# Patient Record
Sex: Male | Born: 1940 | Race: White | Hispanic: No | Marital: Married | Smoking: Former smoker
Health system: Southern US, Community
[De-identification: ages and names within clinical notes are randomized; demographics above are authoritative.]

## PROBLEM LIST (undated history)

## (undated) DIAGNOSIS — K439 Ventral hernia without obstruction or gangrene: Secondary | ICD-10-CM

## (undated) DIAGNOSIS — L219 Seborrheic dermatitis, unspecified: Secondary | ICD-10-CM

## (undated) DIAGNOSIS — R351 Nocturia: Secondary | ICD-10-CM

## (undated) DIAGNOSIS — M25579 Pain in unspecified ankle and joints of unspecified foot: Secondary | ICD-10-CM

## (undated) DIAGNOSIS — N529 Male erectile dysfunction, unspecified: Secondary | ICD-10-CM

## (undated) DIAGNOSIS — H11009 Unspecified pterygium of unspecified eye: Secondary | ICD-10-CM

## (undated) DIAGNOSIS — R209 Unspecified disturbances of skin sensation: Secondary | ICD-10-CM

## (undated) DIAGNOSIS — L97509 Non-pressure chronic ulcer of other part of unspecified foot with unspecified severity: Secondary | ICD-10-CM

## (undated) DIAGNOSIS — L84 Corns and callosities: Secondary | ICD-10-CM

## (undated) DIAGNOSIS — E785 Hyperlipidemia, unspecified: Secondary | ICD-10-CM

## (undated) DIAGNOSIS — J449 Chronic obstructive pulmonary disease, unspecified: Secondary | ICD-10-CM

## (undated) DIAGNOSIS — M545 Low back pain: Secondary | ICD-10-CM

## (undated) DIAGNOSIS — R21 Rash and other nonspecific skin eruption: Secondary | ICD-10-CM

## (undated) DIAGNOSIS — J309 Allergic rhinitis, unspecified: Secondary | ICD-10-CM

## (undated) DIAGNOSIS — R030 Elevated blood-pressure reading, without diagnosis of hypertension: Secondary | ICD-10-CM

## (undated) DIAGNOSIS — N5089 Other specified disorders of the male genital organs: Secondary | ICD-10-CM

## (undated) DIAGNOSIS — F17200 Nicotine dependence, unspecified, uncomplicated: Secondary | ICD-10-CM

## (undated) DIAGNOSIS — N390 Urinary tract infection, site not specified: Secondary | ICD-10-CM

## (undated) DIAGNOSIS — K5730 Diverticulosis of large intestine without perforation or abscess without bleeding: Secondary | ICD-10-CM

## (undated) DIAGNOSIS — B354 Tinea corporis: Secondary | ICD-10-CM

## (undated) DIAGNOSIS — G5600 Carpal tunnel syndrome, unspecified upper limb: Secondary | ICD-10-CM

## (undated) DIAGNOSIS — C44320 Squamous cell carcinoma of skin of unspecified parts of face: Secondary | ICD-10-CM

## (undated) DIAGNOSIS — E7800 Pure hypercholesterolemia, unspecified: Secondary | ICD-10-CM

## (undated) DIAGNOSIS — S060X9A Concussion with loss of consciousness of unspecified duration, initial encounter: Secondary | ICD-10-CM

## (undated) DIAGNOSIS — K5900 Constipation, unspecified: Secondary | ICD-10-CM

## (undated) DIAGNOSIS — R079 Chest pain, unspecified: Secondary | ICD-10-CM

## (undated) DIAGNOSIS — N400 Enlarged prostate without lower urinary tract symptoms: Secondary | ICD-10-CM

## (undated) DIAGNOSIS — Z79899 Other long term (current) drug therapy: Secondary | ICD-10-CM

## (undated) HISTORY — DX: Seborrheic dermatitis, unspecified: L21.9

## (undated) HISTORY — DX: Corns and callosities: L84

## (undated) HISTORY — DX: Elevated blood-pressure reading, without diagnosis of hypertension: R03.0

## (undated) HISTORY — DX: Other specified disorders of the male genital organs: N50.89

## (undated) HISTORY — PX: ELBOW SURGERY: SHX618

## (undated) HISTORY — PX: EYE SURGERY: SHX253

## (undated) HISTORY — DX: Nicotine dependence, unspecified, uncomplicated: F17.200

## (undated) HISTORY — DX: Allergic rhinitis, unspecified: J30.9

## (undated) HISTORY — DX: Unspecified pterygium of unspecified eye: H11.009

## (undated) HISTORY — DX: Tinea corporis: B35.4

## (undated) HISTORY — DX: Male erectile dysfunction, unspecified: N52.9

## (undated) HISTORY — DX: Rash and other nonspecific skin eruption: R21

## (undated) HISTORY — DX: Concussion with loss of consciousness of unspecified duration, initial encounter: S06.0X9A

## (undated) HISTORY — DX: Urinary tract infection, site not specified: N39.0

## (undated) HISTORY — DX: Chest pain, unspecified: R07.9

## (undated) HISTORY — DX: Benign prostatic hyperplasia without lower urinary tract symptoms: N40.0

## (undated) HISTORY — DX: Constipation, unspecified: K59.00

## (undated) HISTORY — DX: Non-pressure chronic ulcer of other part of unspecified foot with unspecified severity: L97.509

## (undated) HISTORY — DX: Squamous cell carcinoma of skin of unspecified parts of face: C44.320

## (undated) HISTORY — DX: Other long term (current) drug therapy: Z79.899

## (undated) HISTORY — DX: Unspecified disturbances of skin sensation: R20.9

## (undated) HISTORY — DX: Low back pain: M54.5

## (undated) HISTORY — DX: Ventral hernia without obstruction or gangrene: K43.9

## (undated) HISTORY — DX: Diverticulosis of large intestine without perforation or abscess without bleeding: K57.30

## (undated) HISTORY — DX: Hyperlipidemia, unspecified: E78.5

## (undated) HISTORY — DX: Carpal tunnel syndrome, unspecified upper limb: G56.00

## (undated) HISTORY — DX: Nocturia: R35.1

## (undated) HISTORY — DX: Chronic obstructive pulmonary disease, unspecified: J44.9

## (undated) HISTORY — DX: Pain in unspecified ankle and joints of unspecified foot: M25.579

---
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
## 1984-06-06 HISTORY — PX: FRACTURE SURGERY: SHX138

## 1994-10-30 DIAGNOSIS — S060XAA Concussion with loss of consciousness status unknown, initial encounter: Secondary | ICD-10-CM

## 1994-10-30 DIAGNOSIS — S060X9A Concussion with loss of consciousness of unspecified duration, initial encounter: Secondary | ICD-10-CM

## 1994-10-30 HISTORY — DX: Concussion with loss of consciousness of unspecified duration, initial encounter: S06.0X9A

## 1994-10-30 HISTORY — DX: Concussion with loss of consciousness status unknown, initial encounter: S06.0XAA

## 1999-12-28 DIAGNOSIS — R21 Rash and other nonspecific skin eruption: Secondary | ICD-10-CM

## 1999-12-28 HISTORY — DX: Rash and other nonspecific skin eruption: R21

## 2001-03-21 DIAGNOSIS — L219 Seborrheic dermatitis, unspecified: Secondary | ICD-10-CM

## 2001-03-21 HISTORY — DX: Seborrheic dermatitis, unspecified: L21.9

## 2001-09-07 ENCOUNTER — Emergency Department (HOSPITAL_COMMUNITY): Admission: EM | Admit: 2001-09-07 | Discharge: 2001-09-08 | Payer: Self-pay | Admitting: *Deleted

## 2001-09-08 ENCOUNTER — Encounter: Payer: Self-pay | Admitting: Emergency Medicine

## 2001-09-08 ENCOUNTER — Inpatient Hospital Stay (HOSPITAL_COMMUNITY): Admission: EM | Admit: 2001-09-08 | Discharge: 2001-09-10 | Payer: Self-pay | Admitting: Emergency Medicine

## 2002-03-08 ENCOUNTER — Emergency Department (HOSPITAL_COMMUNITY): Admission: EM | Admit: 2002-03-08 | Discharge: 2002-03-08 | Payer: Self-pay | Admitting: *Deleted

## 2005-10-06 ENCOUNTER — Emergency Department (HOSPITAL_COMMUNITY): Admission: EM | Admit: 2005-10-06 | Discharge: 2005-10-06 | Payer: Self-pay | Admitting: Emergency Medicine

## 2006-02-24 DIAGNOSIS — J449 Chronic obstructive pulmonary disease, unspecified: Secondary | ICD-10-CM

## 2006-02-24 DIAGNOSIS — E785 Hyperlipidemia, unspecified: Secondary | ICD-10-CM

## 2006-02-24 HISTORY — DX: Chronic obstructive pulmonary disease, unspecified: J44.9

## 2006-02-24 HISTORY — DX: Hyperlipidemia, unspecified: E78.5

## 2006-03-01 DIAGNOSIS — H11009 Unspecified pterygium of unspecified eye: Secondary | ICD-10-CM

## 2006-03-01 HISTORY — DX: Unspecified pterygium of unspecified eye: H11.009

## 2006-09-08 DIAGNOSIS — J309 Allergic rhinitis, unspecified: Secondary | ICD-10-CM

## 2006-09-08 HISTORY — DX: Allergic rhinitis, unspecified: J30.9

## 2007-01-05 HISTORY — PX: KNEE SURGERY: SHX244

## 2007-02-12 ENCOUNTER — Emergency Department (HOSPITAL_COMMUNITY): Admission: EM | Admit: 2007-02-12 | Discharge: 2007-02-12 | Payer: Self-pay | Admitting: Emergency Medicine

## 2007-02-23 ENCOUNTER — Emergency Department (HOSPITAL_COMMUNITY): Admission: EM | Admit: 2007-02-23 | Discharge: 2007-02-23 | Payer: Self-pay | Admitting: Emergency Medicine

## 2007-03-16 DIAGNOSIS — L97509 Non-pressure chronic ulcer of other part of unspecified foot with unspecified severity: Secondary | ICD-10-CM

## 2007-03-16 HISTORY — DX: Non-pressure chronic ulcer of other part of unspecified foot with unspecified severity: L97.509

## 2007-04-11 DIAGNOSIS — M25579 Pain in unspecified ankle and joints of unspecified foot: Secondary | ICD-10-CM

## 2007-04-11 DIAGNOSIS — L84 Corns and callosities: Secondary | ICD-10-CM

## 2007-04-11 HISTORY — DX: Pain in unspecified ankle and joints of unspecified foot: M25.579

## 2007-04-11 HISTORY — DX: Corns and callosities: L84

## 2008-01-02 DIAGNOSIS — N529 Male erectile dysfunction, unspecified: Secondary | ICD-10-CM

## 2008-01-02 HISTORY — DX: Male erectile dysfunction, unspecified: N52.9

## 2008-02-29 ENCOUNTER — Emergency Department (HOSPITAL_COMMUNITY): Admission: EM | Admit: 2008-02-29 | Discharge: 2008-02-29 | Payer: Self-pay | Admitting: Emergency Medicine

## 2009-01-30 ENCOUNTER — Encounter: Admission: RE | Admit: 2009-01-30 | Discharge: 2009-01-30 | Payer: Self-pay | Admitting: Internal Medicine

## 2009-03-16 DIAGNOSIS — G5600 Carpal tunnel syndrome, unspecified upper limb: Secondary | ICD-10-CM

## 2009-03-16 HISTORY — DX: Carpal tunnel syndrome, unspecified upper limb: G56.00

## 2009-05-11 DIAGNOSIS — N390 Urinary tract infection, site not specified: Secondary | ICD-10-CM

## 2009-05-11 DIAGNOSIS — M545 Low back pain, unspecified: Secondary | ICD-10-CM

## 2009-05-11 DIAGNOSIS — K5900 Constipation, unspecified: Secondary | ICD-10-CM

## 2009-05-11 HISTORY — DX: Constipation, unspecified: K59.00

## 2009-05-11 HISTORY — DX: Low back pain, unspecified: M54.50

## 2009-05-11 HISTORY — DX: Urinary tract infection, site not specified: N39.0

## 2009-05-12 ENCOUNTER — Encounter: Admission: RE | Admit: 2009-05-12 | Discharge: 2009-05-12 | Payer: Self-pay | Admitting: Internal Medicine

## 2010-06-14 DIAGNOSIS — R030 Elevated blood-pressure reading, without diagnosis of hypertension: Secondary | ICD-10-CM

## 2010-06-14 DIAGNOSIS — K5730 Diverticulosis of large intestine without perforation or abscess without bleeding: Secondary | ICD-10-CM

## 2010-06-14 HISTORY — DX: Diverticulosis of large intestine without perforation or abscess without bleeding: K57.30

## 2010-06-14 HISTORY — DX: Elevated blood-pressure reading, without diagnosis of hypertension: R03.0

## 2010-10-22 NOTE — H&P (Signed)
[facility]  Patient:    Thomas Reyes, Thomas Reyes Visit Number: [id number] MRN: [id number]          Service Type: MED Location: [phone] Attending Physician:  Terald Sleeper Dictated by:   Terald Sleeper, M.D. Admit Date:  09/08/2001                           History and Physical  IDENTIFICATION:  The patient is a 70 year old male, patient of Dr. Frederik Pear.  CHIEF COMPLAINT:  Cough and cold for the last two weeks.  HISTORY OF PRESENT ILLNESS:  The patient states he has had a "chest cold" for the last two weeks with harsh cough productive of sputum.  His wife also has had an upper respiratory tract infection at home.  Yesterday he developed a temperature of up to 101 degrees and developed left-sided pleuritic pain.  He was seen in the emergency room, given IV antibiotics and a prescription for p.o. Levaquin.  He has returned to the emergency room today, having been unable to keep anything on his stomach, with repetitive bilious colored vomit. Blood cultures also yesterday done in the ER were 2/2 positive for gram-positive cocci in pairs.  PAST MEDICAL HISTORY:  There is no history of heart disease, no history of diabetes mellitus.  The patient denies a history of hypertension.  He is a smoker; however, does not admit to shortness of breath or hemoptysis.  MEDICATIONS:  No other medicine other than Levaquin.  He took one of these today.  SOCIAL HISTORY:  He is retired and a smoker.  No history of alcohol abuse.  REVIEW OF SYSTEMS:  CARDIOVASCULAR:  Positive pleuritic chest pain starting yesterday.  RESPIRATORY:  Chronic cough productive of frothy sputum. GASTROINTESTINAL:  No nausea and vomiting until this morning.  GENITOURINARY: No history of dysuria.  PHYSICAL EXAMINATION:  VITAL SIGNS:  Temperature 96, blood pressure 109/77, pulse 77, respiratory rate 20.  GENERAL:  The patient is a 70 year old somewhat gaunt man who looked  older than his stated age.  No distress.  HEENT:  Normal other than mildly erythematous oropharynx.  LUNGS:  Fine crackles in the left lower lobe without wheezing.  No evidence of respiratory distress.  CARDIOVASCULAR:  Heart sounds are normal.  No murmurs.  ABDOMEN:  There is a fullness in the right upper quadrant, but there was no tenderness, and I could not clearly delineate a liver edge.  EXTREMITIES:  Peripheral pulses are intact.  There is no edema.  CNS:  The patient is alert.  His exam is completely nonfocal.  Strength is equal bilaterally.  LABORATORY DATA:  Chest x-ray shows an extensive left lower lobe consolidation.  White count 12.8, 90% neutrophils, hemoglobin 15.2, platelets 178.  Basic metabolic panel is essentially normal other than a BUN of 31.  His creatinine is 1.4, albumin 3.2.  Liver function tests are normal.  Blood gas revealed pH 7.49, pCO2 of 33.7, pO2 of 56.9.  IMPRESSION:  Pneumococcal pneumonia with bacteremia.  He does not look to be that sick.  I think he probably has been helped by the antibiotics that were given intravenously yesterday.  Probably has underlying chronic obstructive pulmonary disease.   However, I really do not think he is any respiratory distress.  PLAN:  He will be given intravenous Rocephin pending sensitivities of the pneumococcus and will be admitted to a general medical ward.  He appears in stable condition.  Dictated by:   Terald Sleeper, M.D. Attending Physician:  Terald Sleeper DD:  09/08/01 TD:  09/09/01 Job: [id number]

## 2010-10-22 NOTE — Discharge Summary (Signed)
[facility]  Patient:    TABITHA, TUPPER Visit Number: [id number] MRN: [id number]          Service Type: MED Location: [phone] Attending Physician:  Terald Sleeper Dictated by:   Lorin Picket. Claiborne Billings, N.P. Admit Date:  09/08/2001 Disc. Date: 09/10/01   CC:         Lenon Curt. Cassell Clement, M.D.   Discharge Summary  DATE OF BIRTH:  [birth year]  CHIEF COMPLAINT:  Cough and cold for two weeks.  HISTORY OF PRESENT ILLNESS:  The patient is a 70 year old male who is followed as an outpatient by Dr. Murray Hodgkins at [facility] office.  The patient states that he has had a chest cold for two weeks with harsh productive cough with sputum.  His wife also had a respiratory tract infection at home.  He developed a temperature of 101 degrees and some left-sided pleuritic pain.  Seen in the emergency room and given IV antibiotics and a prescription for oral Levaquin.  He returned home and then came back to the emergency room on the day of admission having been unable to keep anything in his stomach with repetitive bilious colored vomitus.  Blood cultures were done in the emergency room on the initial visit, and 2:2 were positive for gram positive cocci in pairs.  The patient was admitted for treatment of sepsis.  PAST MEDICAL HISTORY:  Denies any significant previous medical history, however, does state he is a smoker.  Denies any shortness of breath or hemoptysis.  ADMISSION MEDICATIONS:  Levaquin prescribed during previous emergency room visit, he is on no routine medications.  SOCIAL HISTORY:  Retired and a smoker.  No history of alcohol abuse.  FAMILY HISTORY:  Not listed.  REVIEW OF SYSTEMS:  CARDIOVASCULAR:  Productive pleuritic chest pain starting previously.  RESPIRATORY:  Chronic cough with frothy sputum.  GASTROINTESTINAL:  No nausea or vomiting until day of admission.  GENITOURINARY:  No history of dysuria.  PHYSICAL  EXAMINATION:  VITAL SIGNS:  Temperature 96, blood pressure 109/77, pulse 77, respiratory rate 20.  GENERAL:  A 70 year old, somewhat gaunt appearing male who appears older than his stated age.  In no acute distress.  HEENT:  Normal other than mildly erythematous oropharynx.  LUNGS:  Fine crackles, left lower lobe, without wheezing.  No evidence of respiratory distress.  CARDIOVASCULAR:  Heart sounds are normal without murmur.  ABDOMEN:  Fullness right upper quadrant, but no tenderness.  Bowel sounds are present.  Could not clearly delineate a liver edge.  EXTREMITIES:  Peripheral pulses are intact.  There is no edema.  NEUROLOGIC:  The patient is alert, and neurologic examination nonfocal. Cranial nerves II-XII grossly intact.  Strength is equal bilaterally.  The patient was admitted with pneumococcal pneumonia and bacteremia.  He has likely underlying chronic obstructive pulmonary disease with a smoking history.  ADMISSION LABORATORY DATA:  Chest x-ray indicated left lower lobe pneumonia with prominence of left infrahilar region.  Could not exclude an obstructive mass with CT scan recommended for followup.  Irregular densities in the right apex which could represent scarring.  Recommend followup.  Arterial blood gas on room air showed a pH of7.495, PCO2 33.7, PO2 56.9, bicarbonate 25.4.  CBC on 09/08/01, found white blood cell count high at 12.8, hemoglobin 15.2, hematocrit 44.7, platelets 178.  Neutrophils were elevated at 90.  CBC found sodium slightly low at 133, potassium 3.5, chloride 102, CO2 25, BUN high at 31, creatinine 1.4.  Glucose was elevated at 153.  Liver function tests were unremarkable.  HOSPITAL COURSE:  The patient was admitted to a general medical bed.  Diet continued as regular.  Empiric Rocephin 2 g IV daily initiated.  Previous blood cultures noted positive for pneumococcus.  Also pneumonia presumed pneumococcus.  With antibiotic therapy, the patient  rapidly improved. Tolerating diet well.  Per sensitivities, we will switch to oral Levaquin for a total of a 14 day course.  At discharge, the patient is medically stable/improved.  DISCHARGE LABORATORY DATA:  Followup CBC done on 09/10/01, found white blood cell count normalized at 8.7, hemoglobin 13.9, hematocrit 40.5, platelets 239.  DISCHARGE MEDICATIONS:  Levaquin 500 mg one q.d.  From previous prescription, the patient has 10 days.  We will write for an additional 4 days to give a total of 14 day therapy post discharge.  The patient was on no other routine medications.  DISCHARGE DIAGNOSES: 1. Pneumococcal pneumonia. 2. Pneumococcal sepsis secondary to #1. 3. Probable chronic obstructive pulmonary disease.  CONSULTATIONS:  None.  PROCEDURES:  None.  DIET:  Regular.  DISCHARGE INSTRUCTIONS:  To complete 14 days of Levaquin antibiotic therapy.  FOLLOWUP:  Dr. Murray Hodgkins one to two weeks post discharge to evaluate/follow up for pneumonia and sepsis.  CONDITION ON DISCHARGE:  Stable/improved.  DISCHARGE PROCESS:  Less than 30 minutes.  DISPOSITION:  The patient is returning home independently. Dictated by:   Lorin Picket. Claiborne Billings, N.P. Attending Physician:  Terald Sleeper DD:  09/10/01 TD:  09/11/01 Job: [id number]

## 2011-10-01 ENCOUNTER — Emergency Department (HOSPITAL_COMMUNITY): Payer: Medicare Other

## 2011-10-01 ENCOUNTER — Encounter (HOSPITAL_COMMUNITY): Payer: Self-pay | Admitting: *Deleted

## 2011-10-01 ENCOUNTER — Emergency Department (HOSPITAL_COMMUNITY)
Admission: EM | Admit: 2011-10-01 | Discharge: 2011-10-01 | Disposition: A | Discharge status: Home / Self Care | Payer: Medicare Other | Attending: Emergency Medicine | Admitting: Emergency Medicine

## 2011-10-01 DIAGNOSIS — R0602 Shortness of breath: Secondary | ICD-10-CM | POA: Insufficient documentation

## 2011-10-01 DIAGNOSIS — J029 Acute pharyngitis, unspecified: Secondary | ICD-10-CM | POA: Insufficient documentation

## 2011-10-01 DIAGNOSIS — R059 Cough, unspecified: Secondary | ICD-10-CM | POA: Insufficient documentation

## 2011-10-01 DIAGNOSIS — E7800 Pure hypercholesterolemia, unspecified: Secondary | ICD-10-CM | POA: Insufficient documentation

## 2011-10-01 DIAGNOSIS — R05 Cough: Secondary | ICD-10-CM | POA: Insufficient documentation

## 2011-10-01 DIAGNOSIS — R51 Headache: Secondary | ICD-10-CM | POA: Insufficient documentation

## 2011-10-01 DIAGNOSIS — J069 Acute upper respiratory infection, unspecified: Secondary | ICD-10-CM | POA: Insufficient documentation

## 2011-10-01 HISTORY — DX: Pure hypercholesterolemia, unspecified: E78.00

## 2011-10-01 NOTE — ED Notes (Signed)
Pt says he feels like he has pneumonia or maybe a cold. Pt reports symptoms started on Wednesday with shortness of breath and productive cough with white/yellow colored sputum.  Pt unsure if he has had fevers.  Pt reports using OTC cold medication.  Pt also reports sore throat, nasal drainage.

## 2011-10-01 NOTE — Discharge Instructions (Signed)
Saline Nose Drops  To help clear a stuffy nose, put salt water (saline) nose drops in your infant's nose. This helps to loosen the secretions in the nose. Use a bulb syringe to clean the nose out:  Before feeding.   Before putting your infant down for naps.   No more than once every 3 hours to avoid irritating your infant's nostrils.  HOME CARE  Buy nose drops at your local drug store. You can also make nose drops yourself. Mix 1 cup of water with  teaspoon of salt. Stir. Store this mixture at room temperature. Make a new batch daily.   To use the drops:   Put 1 or 2 drops in each side of infant's nose with a clean medicine dropper. Do not use this dropper for any other medicine.   Squeeze the air out of the suction bulb before inserting it into your infant's nose.   While still squeezing the bulb flat, place the tip of the bulb into a nostril. Let air come back into the bulb. The suction will pull snot out of the nose and into the bulb.   Repeat on other nostril.   Squeeze the bulb several times into a tissue and wash the bulb tip in soapy water. Store the bulb with the tip side down on paper towel.   Use the bulb syringe with only the saline drops to avoid irritating your infant's nostrils.  GET HELP RIGHT AWAY IF:  The snot changes to green or yellow.   The snot gets thicker.   Your infant is 3 months or younger with a rectal temperature of 100.4 F (38 C) or higher.   Your infant is older than 3 months with a rectal temperature of 102 F (38.9 C) or higher.   The stuffy nose lasts 10 days or longer.   There is trouble breathing or feeding.  MAKE SURE YOU:  Understand these instructions.   Will watch your infant's condition.   Will get help right away if your infant is not doing well or gets worse.  Document Released: 03/20/2009 Document Revised: 05/12/2011 Document Reviewed: 03/20/2009 [facility] Patient Information 2012 [place], [place]. Ask the Pharmacist for  Phenylephrine 10 mg tabs take one every 4 hours for congestion   This medication is behind the counter and only available by request

## 2011-10-01 NOTE — ED Provider Notes (Signed)
History     CSN: [id number]  Arrival date & time 10/01/11  1022   First MD Initiated Contact with Patient 10/01/11 1118      Chief Complaint  Patient presents with  . Cough  . Shortness of Breath  . Nasal Congestion  . Sore Throat  . Headache    (Consider location/radiation/quality/duration/timing/severity/associated sxs/prior treatment) HPI Comments: Patient states that he has had URI symptoms for the last 4 days he's been taking over-the-counter decongestants with minimal relief.  States when he lays down at night.  He feels "strangled" when he sits up he can breathe again.  He is in the emergency department to make sure that he doesn't have pneumonia, as he's had this in the past.  He does have a nonproductive annoying  cough  Patient is a 71 y.o. male presenting with cough, shortness of breath, pharyngitis, and headaches. The history is provided by the patient and a relative.  Cough This is a new problem. The current episode started more than 2 days ago. The problem has not changed since onset.The cough is non-productive. There has been no fever. Associated symptoms include sweats, headaches, rhinorrhea, sore throat and shortness of breath. Pertinent negatives include no chest pain, no chills, no ear congestion and no myalgias.  Shortness of Breath  Associated symptoms include rhinorrhea, sore throat, cough and shortness of breath. Pertinent negatives include no chest pain and no fever.  Sore Throat Associated symptoms include congestion, coughing, headaches and a sore throat. Pertinent negatives include no chest pain, chills, fever, myalgias, neck pain or weakness.  Headache  Associated symptoms include shortness of breath. Pertinent negatives include no fever.    Past Medical History  Diagnosis Date  . Hypercholesterolemia     Past Surgical History  Procedure Date  . Elbow surgery   . Knee surgery     No family history on file.  History  Substance Use Topics  .  Smoking status: Former Games developer  . Smokeless tobacco: Not on file  . Alcohol Use: No      Review of Systems  Constitutional: Negative for fever and chills.  HENT: Positive for congestion, sore throat and rhinorrhea. Negative for neck pain, sinus pressure and ear discharge.   Respiratory: Positive for cough and shortness of breath.   Cardiovascular: Negative for chest pain.  Musculoskeletal: Negative for myalgias.  Neurological: Positive for headaches. Negative for dizziness and weakness.    Allergies  Review of patient's allergies indicates no known allergies.  Home Medications   Current Outpatient Rx  Name Route Sig Dispense Refill  . PRAVASTATIN SODIUM 40 MG PO TABS Oral Take 40 mg by mouth daily.      BP 154/75  Pulse 76  Temp(Src) 98.3 F (36.8 C) (Oral)  Resp 20  Ht 5\' 7"  (1.702 m)  Wt 140 lb (63.504 kg)  BMI 21.93 kg/m2  SpO2 95%  Physical Exam  Constitutional: He is oriented to person, place, and time. He appears well-developed and well-nourished.  HENT:  Head: Normocephalic. No trismus in the jaw.  Right Ear: External ear normal. Tympanic membrane is not bulging.  Left Ear: External ear normal. Tympanic membrane is not bulging.  Nose: Rhinorrhea and sinus tenderness present. No mucosal edema, nasal deformity or septal deviation. Right sinus exhibits no maxillary sinus tenderness and no frontal sinus tenderness. Left sinus exhibits no maxillary sinus tenderness and no frontal sinus tenderness.  Mouth/Throat: Uvula is midline and mucous membranes are normal. No uvula swelling. No tonsillar abscesses.  Cardiovascular: Normal rate.   Pulmonary/Chest: Effort normal.  Abdominal: Soft.  Musculoskeletal: Normal range of motion.  Neurological: He is alert and oriented to person, place, and time.  Skin: Skin is warm and dry.    ED Course  Procedures (including critical care time)  Labs Reviewed - No data to display Dg Chest 2 View  10/01/2011  *RADIOLOGY REPORT*   Clinical Data: Cough and SOB  CHEST - 2 VIEW  Comparison: 01/30/2009  Findings: Heart size is normal.  No pleural effusion or edema.  No airspace consolidation identified.  Right apical scarring is noted, this appears similar to previous exam.  The visualized osseous structures are intact.  IMPRESSION:  1.  No acute cardiopulmonary abnormalities.  Original Report Authenticated By: Rosealee Albee, M.D.     1. Upper respiratory infection       MDM  Patient reassured.  Chest x-ray is normal, showing no signs of pneumonia.  Discussed treatment for upper respiratory tract infections and use of decongestants        Arman Filter, NP 10/01/11 1142  Arman Filter, NP 10/01/11 1142

## 2011-10-02 NOTE — ED Provider Notes (Signed)
Medical screening examination/treatment/procedure(s) were performed by non-physician practitioner and as supervising physician I was immediately available for consultation/collaboration.  Jessalynn Mccowan T Solmon Bohr, MD 10/02/11 0808 

## 2011-12-26 DIAGNOSIS — C44320 Squamous cell carcinoma of skin of unspecified parts of face: Secondary | ICD-10-CM

## 2011-12-26 DIAGNOSIS — R079 Chest pain, unspecified: Secondary | ICD-10-CM

## 2011-12-26 HISTORY — DX: Squamous cell carcinoma of skin of unspecified parts of face: C44.320

## 2011-12-26 HISTORY — DX: Chest pain, unspecified: R07.9

## 2012-08-20 DIAGNOSIS — R351 Nocturia: Secondary | ICD-10-CM

## 2012-08-20 DIAGNOSIS — N5089 Other specified disorders of the male genital organs: Secondary | ICD-10-CM

## 2012-08-20 HISTORY — DX: Nocturia: R35.1

## 2012-08-20 HISTORY — DX: Other specified disorders of the male genital organs: N50.89

## 2012-08-27 ENCOUNTER — Ambulatory Visit (INDEPENDENT_AMBULATORY_CARE_PROVIDER_SITE_OTHER): Payer: Medicare Other | Admitting: *Deleted

## 2012-08-27 VITALS — BP 140/64 | HR 66 | Temp 95.0°F | Resp 20 | Ht 68.0 in | Wt 136.0 lb

## 2012-08-27 DIAGNOSIS — I10 Essential (primary) hypertension: Secondary | ICD-10-CM

## 2012-08-27 NOTE — Patient Instructions (Signed)
Per Dr Renato Gails keep next appointment and right down your readings and bring that in with you.

## 2012-09-11 ENCOUNTER — Encounter: Payer: Self-pay | Admitting: Internal Medicine

## 2012-09-27 ENCOUNTER — Ambulatory Visit: Payer: Self-pay | Admitting: Internal Medicine

## 2012-10-03 ENCOUNTER — Encounter: Payer: Self-pay | Admitting: Internal Medicine

## 2012-10-04 ENCOUNTER — Encounter: Payer: Self-pay | Admitting: Internal Medicine

## 2012-10-04 ENCOUNTER — Ambulatory Visit (INDEPENDENT_AMBULATORY_CARE_PROVIDER_SITE_OTHER): Payer: Medicare Other | Admitting: Internal Medicine

## 2012-10-04 VITALS — BP 152/90 | HR 69 | Temp 97.8°F | Resp 16 | Ht 67.0 in | Wt 136.0 lb

## 2012-10-04 DIAGNOSIS — K5901 Slow transit constipation: Secondary | ICD-10-CM

## 2012-10-04 DIAGNOSIS — N138 Other obstructive and reflux uropathy: Secondary | ICD-10-CM | POA: Insufficient documentation

## 2012-10-04 DIAGNOSIS — E7800 Pure hypercholesterolemia, unspecified: Secondary | ICD-10-CM

## 2012-10-04 DIAGNOSIS — N400 Enlarged prostate without lower urinary tract symptoms: Secondary | ICD-10-CM

## 2012-10-04 DIAGNOSIS — Z860100 Personal history of colon polyps, unspecified: Secondary | ICD-10-CM | POA: Insufficient documentation

## 2012-10-04 DIAGNOSIS — I10 Essential (primary) hypertension: Secondary | ICD-10-CM

## 2012-10-04 DIAGNOSIS — N401 Enlarged prostate with lower urinary tract symptoms: Secondary | ICD-10-CM | POA: Insufficient documentation

## 2012-10-04 DIAGNOSIS — R079 Chest pain, unspecified: Secondary | ICD-10-CM

## 2012-10-04 NOTE — Patient Instructions (Signed)
Discussed increasing fruit and veggies in diet to increase fiber.  Drink plenty of fluids with it to help move bowels.  If no success with that, try fiber supplement like benefiber or metamucil.

## 2012-10-04 NOTE — Progress Notes (Signed)
Patient ID: Thomas Reyes, male   DOB: [birth year], 72 y.o.   MRN: [id number] Code Status: Has living will  No Known Allergies  Chief Complaint  Patient presents with  . Medical Managment of Chronic Issues    no new problems today    HPI: Patient is a 72 y.o. Caucasian male seen in the office today for f/u after stress test for chest pain he had been having last time and urinary frequency.  Had his stress test which was unremarkable.  Has f/u with Dr. Jacinto Halim on 5/12. No more of the chest pains.    Went to urologist for prostate.  Was told it was a little enlarged, but not concerning.  Has f/u with him in June.  Is taking new medicine.    Is up to 80 mg of pravachol now per my recommendations.  BP is good at home:  110s-130s/60s-70s over past month.  Takes first thing in morning.    Due sept 8, 2014 for cscope (has polyps and gets q3 years).  Having difficulty with constipation.  Using stool softeners up to 4 a day.    Still off cigarettes.  Encouraged to stay off of them.   Review of Systems:  Review of Systems  Constitutional: Negative for fever, chills and malaise/fatigue.  HENT: Negative for congestion.   Eyes: Negative for blurred vision.  Respiratory: Negative for shortness of breath.   Cardiovascular: Negative for chest pain, palpitations and leg swelling.  Gastrointestinal: Positive for constipation. Negative for nausea, vomiting, abdominal pain and melena.  Genitourinary: Positive for frequency. Negative for dysuria.       Nocturia  Musculoskeletal: Negative for myalgias.  Skin: Negative for rash.  Neurological: Negative for dizziness, weakness and headaches.  Endo/Heme/Allergies: Does not bruise/bleed easily.  Psychiatric/Behavioral: Negative for depression.   Past Medical History  Diagnosis Date  . Hypercholesterolemia   . Edema of male genital organs   . Chest pain, unspecified   . Nocturia   . 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 of  face   . Chest pain, unspecified   . Diverticulosis of colon (without mention of hemorrhage)   . Elevated blood pressure reading without diagnosis of hypertension   . Unspecified constipation   . Urinary tract infection, site not specified   . Lumbago   . Carpal tunnel syndrome   . Encounter for long-term (current) use of other medications   . Impotence of organic origin   . Corns and callosities   . Pain in joint, ankle and foot   . Ulcer of other part of foot   . Allergic rhinitis, cause unspecified   . Pterygium, unspecified   . Other and unspecified hyperlipidemia   . COPD (chronic obstructive pulmonary disease)   . Hypertrophy of prostate without urinary obstruction and other lower urinary tract symptoms (LUTS)   . Dermatophytosis of the body   . Tobacco use disorder   . Disturbance of skin sensation   . Ventral hernia, unspecified, without mention of obstruction or gangrene   . Seborrheic dermatitis, unspecified   . Rash and other nonspecific skin eruption   . Concussion, unspecified    Past Surgical History  Procedure Laterality Date  . Elbow surgery    . Knee surgery    . Tonsillectomy  1948  . Fracture surgery  1986    right hip, right elbow   Social History:   reports that he has quit smoking. He does not have any smokeless  tobacco history on file. He reports that he does not drink alcohol or use illicit drugs.  Family History  Problem Relation Age of Onset  . Diabetes Mother   . Hypertension Mother   . Cancer Father     Medications: Patient's Medications  New Prescriptions   No medications on file  Previous Medications   ASPIRIN 81 MG TABLET    Take 81 mg by mouth daily. Take one tablet once a day   PRAVASTATIN (PRAVACHOL) 40 MG TABLET    Take 80 mg by mouth daily. Take one tablet by mouth once daily.  Modified Medications   No medications on file  Discontinued Medications   No medications on file   Physical Exam: Filed Vitals:   10/04/12 1059   BP: 152/90  Pulse: 69  Temp: 97.8 F (36.6 C)  TempSrc: Oral  Resp: 16  Height: 5\' 7"  (1.702 m)  Weight: 136 lb (61.689 kg)  SpO2: 95%  Physical Exam  Constitutional: He is oriented to person, place, and time.  Thin Caucasian male, NAD  HENT:  Head: Normocephalic and atraumatic.  rhinophyma  Cardiovascular: Normal rate, regular rhythm, normal heart sounds and intact distal pulses.   Pulmonary/Chest: Effort normal and breath sounds normal.  Abdominal: Soft. Bowel sounds are normal.  Musculoskeletal: Normal range of motion.  Neurological: He is alert and oriented to person, place, and time.  Skin: Skin is warm and dry.  Psychiatric: He has a normal mood and affect.   Labs reviewed: 06/23/2011 CBC Wbc 6.1 Rbc 4.88 Hemoglobin 15.2  CMP Glucose 92 Bun 13 Creatinine 1.16  Lipid Panel Cholesterol 179 Triglycerides 72 Hdl 56 Ldl 109  12/21/2011 CBC Wbc 5.6 Rbc 4.77 Hemoglobin 13.9  CMP Glucose 93 Bun 14 Creatinine 1.13  Lipid Panel Cholesterol 186 Triglycerides 74 Hdl 59 Ldl 112  08/16/2012 BMP Glucose 91 Bun 12 Creatinine 1.21 CBC Wbc 6.6 Rbc 4.57 Hemoglobin 13.7  Lipid Panel Cholesterol 175 Triglycerides 71 Hdl 53 Ldl 108  Past Procedures: 04/9146 CT Brain ? Vascular malformation left cerebral pedicle  05//2000 ETT  Low Risk  10/2000 Flex Sig (GREEN) Internal Hemorrhoid Diverticulosis 07-08-2003 Nerve Conduction Study-without electrophysiologic evidence of a left median mononeuropathy across the wrist consistent with moderate left carpal tunnel syndrome without denervation. 08/19/2005 Chest X-Ray  09-22-05 CT Chest left coronary artery calcification, probable post-inflammatory scarring in the lung apices, right greater than left, no evidence of mass.  12/10/05 Chest X-Ray  01/30/2009 X-Ray of the Lumbar Spine Normal  01/30/2009 Cervical Spine X-Ray No acute fracture  01/31/2009 Chest X-Ray Normal  05/12/2009 Lumbar Spine X-Ray Degenerative Changes at L5-S1 Normal alignment   02/11/2010-Colonoscopy  polyps Dr Lavada Mesi Magod   Assessment/Plan Constipation, slow transit Encouraged higher fiber diet with fruits and veggies, and, if that does not help, use fiber supplement with plenty of fluids.  Advised not to use stool softeners indefinitely due to long term side effects in the colon.  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  Was put on a new medication and doesn't know what it was.  Note is not yet scanned.  Chest pain No further episodes.  Had an unremarkable stress test and echo with Dr. Jacinto Halim.  Has f/u with him.  Personal history of colonic polyps Due for f/u colonoscopy in 9/14.    Hypercholesterolemia Continue pravachol and asa.    Labs/tests ordered Lipids, cbc, bmp  F/u 3 mos.

## 2012-10-06 NOTE — Assessment & Plan Note (Signed)
Encouraged higher fiber diet with fruits and veggies, and, if that does not help, use fiber supplement with plenty of fluids.  Advised not to use stool softeners indefinitely due to long term side effects in the colon.

## 2012-10-06 NOTE — Assessment & Plan Note (Signed)
No further episodes.  Had an unremarkable stress test and echo with Dr. Jacinto Halim.  Has f/u with him.

## 2012-10-06 NOTE — Assessment & Plan Note (Signed)
Due for f/u colonoscopy in 9/14.

## 2012-10-06 NOTE — Assessment & Plan Note (Signed)
Now following with urology.  Was put on a new medication and doesn't know what it was.  Note is not yet scanned.

## 2012-10-06 NOTE — Assessment & Plan Note (Signed)
Continue pravachol and asa.

## 2012-10-16 ENCOUNTER — Encounter: Payer: Self-pay | Admitting: Internal Medicine

## 2013-01-08 ENCOUNTER — Other Ambulatory Visit: Payer: Medicare Other

## 2013-01-08 DIAGNOSIS — N400 Enlarged prostate without lower urinary tract symptoms: Secondary | ICD-10-CM

## 2013-01-08 DIAGNOSIS — E7800 Pure hypercholesterolemia, unspecified: Secondary | ICD-10-CM

## 2013-01-08 DIAGNOSIS — I10 Essential (primary) hypertension: Secondary | ICD-10-CM

## 2013-01-08 DIAGNOSIS — Z86010 Personal history of colonic polyps: Secondary | ICD-10-CM

## 2013-01-09 LAB — LIPID PANEL
Chol/HDL Ratio: 2.7 ratio units (ref 0.0–5.0)
Cholesterol, Total: 153 mg/dL (ref 100–199)
HDL: 57 mg/dL (ref >39)
LDL Calculated: 82 mg/dL (ref 0–99)
Triglycerides: 70 mg/dL (ref 0–149)
VLDL Cholesterol Cal: 14 mg/dL (ref 5–40)

## 2013-01-09 LAB — CBC WITH DIFFERENTIAL/PLATELET
Basophils Absolute: 0 10*3/uL (ref 0.0–0.2)
Basos: 0 % (ref 0–3)
Eos: 5 % (ref 0–5)
Eosinophils Absolute: 0.3 10*3/uL (ref 0.0–0.4)
HCT: 40.9 % (ref 37.5–51.0)
Hemoglobin: 13.6 g/dL (ref 12.6–17.7)
Immature Grans (Abs): 0 10*3/uL (ref 0.0–0.1)
Immature Granulocytes: 0 % (ref 0–2)
Lymphocytes Absolute: 1.8 10*3/uL (ref 0.7–3.1)
Lymphs: 32 % (ref 14–46)
MCH: 28.7 pg (ref 26.6–33.0)
MCHC: 33.3 g/dL (ref 31.5–35.7)
MCV: 86 fL (ref 79–97)
Monocytes Absolute: 0.4 10*3/uL (ref 0.1–0.9)
Monocytes: 7 % (ref 4–12)
Neutrophils Absolute: 3.2 10*3/uL (ref 1.4–7.0)
Neutrophils Relative %: 56 % (ref 40–74)
RBC: 4.74 x10E6/uL (ref 4.14–5.80)
RDW: 14.2 % (ref 12.3–15.4)
WBC: 5.7 10*3/uL (ref 3.4–10.8)

## 2013-01-09 LAB — BASIC METABOLIC PANEL
BUN/Creatinine Ratio: 8 — ABNORMAL LOW (ref 10–22)
BUN: 10 mg/dL (ref 8–27)
CO2: 25 mmol/L (ref 18–29)
Calcium: 9.1 mg/dL (ref 8.6–10.2)
Chloride: 106 mmol/L (ref 97–108)
Creatinine, Ser: 1.2 mg/dL (ref 0.76–1.27)
GFR calc Af Amer: 69 mL/min/{1.73_m2} (ref >59)
GFR calc non Af Amer: 60 mL/min/{1.73_m2} (ref >59)
Glucose: 89 mg/dL (ref 65–99)
Potassium: 4.3 mmol/L (ref 3.5–5.2)
Sodium: 141 mmol/L (ref 134–144)

## 2013-01-10 ENCOUNTER — Ambulatory Visit: Payer: Medicare Other | Admitting: Internal Medicine

## 2013-01-17 ENCOUNTER — Encounter: Payer: Self-pay | Admitting: *Deleted

## 2013-01-18 ENCOUNTER — Encounter: Payer: Self-pay | Admitting: Internal Medicine

## 2013-01-18 ENCOUNTER — Ambulatory Visit (INDEPENDENT_AMBULATORY_CARE_PROVIDER_SITE_OTHER): Payer: Medicare Other | Admitting: Internal Medicine

## 2013-01-18 VITALS — BP 140/78 | HR 57 | Temp 97.4°F | Resp 18 | Ht 67.0 in | Wt 133.0 lb

## 2013-01-18 DIAGNOSIS — K5901 Slow transit constipation: Secondary | ICD-10-CM

## 2013-01-18 DIAGNOSIS — C44619 Basal cell carcinoma of skin of left upper limb, including shoulder: Secondary | ICD-10-CM

## 2013-01-18 DIAGNOSIS — E7800 Pure hypercholesterolemia, unspecified: Secondary | ICD-10-CM

## 2013-01-18 DIAGNOSIS — I10 Essential (primary) hypertension: Secondary | ICD-10-CM

## 2013-01-18 DIAGNOSIS — C44611 Basal cell carcinoma of skin of unspecified upper limb, including shoulder: Secondary | ICD-10-CM

## 2013-01-18 MED ORDER — PRAVASTATIN SODIUM 80 MG PO TABS: 80.0000 mg | ORAL_TABLET | Freq: Every day | ORAL | Status: DC

## 2013-01-18 NOTE — Progress Notes (Signed)
Patient ID: Thomas Reyes, male   DOB: [birth year], 72 y.o.   MRN: [id number] Location:  [facility] / [place] [facility]  Code Status: full code   No Known Allergies  Chief Complaint  Patient presents with  . Follow-up    left arm ? skin cancer    HPI: Patient is a 72 y.o. white male seen in the office today for f/u of chronic conditions.  He is concerned he may have skin cancer on his left arm. BP range from 5/8 to 01/15/13:  107-134/60s-70s with HR 50s-60s.  Left posterior forearm with raised pearly purple papule about 2 inches above his wrist.  Has bumped that arm during mowing also, but it healed there since.  Has had skin cancer on his head and his left arm before (squamous cell).  Discussed using sunscreen when outside and wearing a hat.    Reviewed lipid panel with patient--on pravachol 80mg  now.    Review of Systems:  Review of Systems  Constitutional: Negative for fever and weight loss.  Eyes: Positive for redness. Negative for blurred vision.  Respiratory: Negative for shortness of breath.   Cardiovascular: Negative for chest pain.  Gastrointestinal: Negative for heartburn and constipation.  Genitourinary: Negative for dysuria.  Musculoskeletal: Negative for falls.  Skin: Positive for itching.       Area on left forearm concerning  Neurological: Negative for dizziness, weakness and headaches.  Endo/Heme/Allergies: Bruises/bleeds easily.  Psychiatric/Behavioral: Negative for memory loss.    Past Medical History  Diagnosis Date  . Hypercholesterolemia   . Edema of male genital organs 08/20/2012  . Chest pain, unspecified 08/20/2012  . Nocturia 08/20/2012  . 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 Chest pain, unspecified 12/26/2011  . Diverticulosis of colon (without mention of hemorrhage) 06/14/2010  . Elevated blood pressure reading without diagnosis of hypertension 06/14/2010  . Unspecified  constipation 05/11/2009  . Urinary tract infection, site not specified 05/11/2009  . Lumbago 05/11/2009  . Carpal tunnel syndrome 03/16/2009  . Encounter for long-term (current) use of other medications   . 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 Corns and callosities 04/11/2007  . Pain in joint, ankle and foot 04/11/2007  . Ulcer of other part of foot 03/16/2007  . Allergic rhinitis, cause unspecified 09/08/2006  . Pterygium, unspecified 03/01/2006  . Other and unspecified hyperlipidemia 02/24/2006  . COPD (chronic obstructive pulmonary disease) 02/24/2006  . Hypertrophy of prostate without urinary obstruction and other lower urinary tract symptoms (LUTS)   . Dermatophytosis of the body   . Tobacco use disorder   . Disturbance of skin sensation   . Ventral hernia, unspecified, without mention of obstruction or gangrene   . Seborrheic dermatitis, unspecified 03/21/2001  . Rash and other nonspecific skin eruption 12/28/1999  . Concussion, unspecified 10/30/1994    Past Surgical History  Procedure Laterality Date  . Elbow surgery Right   . Knee surgery Right 01/2007    knee cap injury  . Tonsillectomy  1948  . Fracture surgery  1986    right hip, right elbow    Social History:   reports that he has quit smoking. He does not have any smokeless tobacco history on file. He reports that he does not drink alcohol or use illicit drugs.  Family History  Problem Relation Age of Onset  . Diabetes Mother   . Hypertension Mother   . Cancer Father     Medications: Patient's  Medications  New Prescriptions   No medications on file  Previous Medications   ASPIRIN 81 MG TABLET    Take 81 mg by mouth daily. Take one tablet once a day   PRAVASTATIN (PRAVACHOL) 80 MG TABLET    Take 80 mg by mouth daily. Take 1 tablet by mouth at bedtime for cholesterol.  Modified Medications   No medications on file  Discontinued Medications   No medications on file     Physical Exam: Filed  Vitals:   01/18/13 0830  BP: 140/78  Pulse: 57  Temp: 97.4 F (36.3 C)  TempSrc: Oral  Resp: 18  Height: 5\' 7"  (1.702 m)  Weight: 133 lb (60.328 kg)  SpO2: 97%  Physical Exam  Constitutional: He is oriented to person, place, and time. He appears well-developed and well-nourished. No distress.  HENT:  Head: Normocephalic and atraumatic.  Cardiovascular: Normal rate, regular rhythm, normal heart sounds and intact distal pulses.   Pulmonary/Chest: Effort normal and breath sounds normal. No respiratory distress.  Abdominal: Soft. Bowel sounds are normal. He exhibits no distension and no mass. There is no tenderness.  Musculoskeletal: Normal range of motion. He exhibits no edema and no tenderness.  Neurological: He is alert and oriented to person, place, and time.  Skin: Skin is warm and dry. No rash noted.  Some ecchymoses on arms visible, left forearm with 1/2 inch diameter papule, purple, pearly, nontender, about 2 inches above wrist on dorsal forearm;  Also has rhinophyma  Psychiatric: He has a normal mood and affect.  ?slight intellectual disability    Labs reviewed: Basic Metabolic Panel:  Recent Labs  45/40/98 0825  NA 141  K 4.3  CL 106  CO2 25  GLUCOSE 89  BUN 10  CREATININE 1.20  CALCIUM 9.1  CBC:  Recent Labs  01/08/13 0825  WBC 5.7  NEUTROABS 3.2  HGB 13.6  HCT 40.9  MCV 86   Lipid Panel:  Recent Labs  01/08/13 0825  HDL 57  LDLCALC 82  TRIG 70  CHOLHDL 2.7   Assessment/Plan 1. Hypercholesterolemia - pravastatin (PRAVACHOL) 80 MG tablet; Take 1 tablet (80 mg total) by mouth daily. Take 1 tablet by mouth at bedtime for cholesterol.  Dispense: 90 tablet; Refill: 3 -much improved with highest dose pravachol--notes big pill  2. 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 Essential hypertension, benign --at goal all of the time at home  4. Constipation, slow transit -fiber and miralax helped, but then got too loose, and now hasn't had to take anything -eating more vegetables and that's helping--going about daily now -for cscope again next month--needs every 3 years due to his diverticulosis--reinforced importance of doing this  Labs/tests ordered:  Cbc, liver panel, lipids before 6 month f/u Next appt: 1 month re: left wrist ? Basal cell; 6 mo for regular visit

## 2013-02-13 LAB — HM COLONOSCOPY

## 2013-02-14 ENCOUNTER — Ambulatory Visit: Payer: Medicare Other | Admitting: Internal Medicine

## 2013-07-16 ENCOUNTER — Other Ambulatory Visit: Payer: Commercial Managed Care - HMO

## 2013-07-16 DIAGNOSIS — C44619 Basal cell carcinoma of skin of left upper limb, including shoulder: Secondary | ICD-10-CM

## 2013-07-16 DIAGNOSIS — E7800 Pure hypercholesterolemia, unspecified: Secondary | ICD-10-CM

## 2013-07-17 LAB — CBC WITH DIFFERENTIAL/PLATELET
Basophils Absolute: 0 10*3/uL (ref 0.0–0.2)
Basos: 0 %
Eos: 5 %
Eosinophils Absolute: 0.3 10*3/uL (ref 0.0–0.4)
HCT: 41.5 % (ref 37.5–51.0)
Hemoglobin: 13.5 g/dL (ref 12.6–17.7)
Immature Grans (Abs): 0 10*3/uL (ref 0.0–0.1)
Immature Granulocytes: 0 %
Lymphocytes Absolute: 1.4 10*3/uL (ref 0.7–3.1)
Lymphs: 25 %
MCH: 25.9 pg — ABNORMAL LOW (ref 26.6–33.0)
MCHC: 32.5 g/dL (ref 31.5–35.7)
MCV: 80 fL (ref 79–97)
Monocytes Absolute: 0.4 10*3/uL (ref 0.1–0.9)
Monocytes: 6 %
Neutrophils Absolute: 3.8 10*3/uL (ref 1.4–7.0)
Neutrophils Relative %: 64 %
RBC: 5.21 x10E6/uL (ref 4.14–5.80)
RDW: 15.2 % (ref 12.3–15.4)
WBC: 5.9 10*3/uL (ref 3.4–10.8)

## 2013-07-17 LAB — LIPID PANEL
Chol/HDL Ratio: 3.1 ratio units (ref 0.0–5.0)
Cholesterol, Total: 176 mg/dL (ref 100–199)
HDL: 56 mg/dL (ref >39)
LDL Calculated: 106 mg/dL — ABNORMAL HIGH (ref 0–99)
Triglycerides: 70 mg/dL (ref 0–149)
VLDL Cholesterol Cal: 14 mg/dL (ref 5–40)

## 2013-07-17 LAB — HEPATIC FUNCTION PANEL
ALT: 14 IU/L (ref 0–44)
AST: 23 IU/L (ref 0–40)
Albumin: 4.1 g/dL (ref 3.5–4.8)
Alkaline Phosphatase: 59 IU/L (ref 39–117)
Bilirubin, Direct: 0.14 mg/dL (ref 0.00–0.40)
Total Bilirubin: 0.6 mg/dL (ref 0.0–1.2)
Total Protein: 6.6 g/dL (ref 6.0–8.5)

## 2013-07-18 ENCOUNTER — Ambulatory Visit (INDEPENDENT_AMBULATORY_CARE_PROVIDER_SITE_OTHER): Payer: Medicare HMO | Admitting: Internal Medicine

## 2013-07-18 ENCOUNTER — Encounter: Payer: Self-pay | Admitting: Internal Medicine

## 2013-07-18 VITALS — BP 160/84 | HR 54 | Temp 97.4°F | Wt 138.4 lb

## 2013-07-18 DIAGNOSIS — Z86010 Personal history of colonic polyps: Secondary | ICD-10-CM

## 2013-07-18 DIAGNOSIS — E7800 Pure hypercholesterolemia, unspecified: Secondary | ICD-10-CM

## 2013-07-18 DIAGNOSIS — K5901 Slow transit constipation: Secondary | ICD-10-CM

## 2013-07-18 DIAGNOSIS — Z23 Encounter for immunization: Secondary | ICD-10-CM

## 2013-07-18 DIAGNOSIS — I10 Essential (primary) hypertension: Secondary | ICD-10-CM | POA: Insufficient documentation

## 2013-07-18 DIAGNOSIS — R21 Rash and other nonspecific skin eruption: Secondary | ICD-10-CM | POA: Insufficient documentation

## 2013-07-18 DIAGNOSIS — N400 Enlarged prostate without lower urinary tract symptoms: Secondary | ICD-10-CM

## 2013-07-18 DIAGNOSIS — R079 Chest pain, unspecified: Secondary | ICD-10-CM

## 2013-07-18 MED ORDER — LISINOPRIL 5 MG PO TABS: 5.0000 mg | ORAL_TABLET | Freq: Every day | ORAL | Status: DC

## 2013-07-18 MED ORDER — SIMVASTATIN 20 MG PO TABS: 20.0000 mg | ORAL_TABLET | Freq: Every day | ORAL | Status: DC

## 2013-07-18 MED ORDER — ZOSTER VACCINE LIVE 19400 UNT/0.65ML ~~LOC~~ SOLR: 0.6500 mL | Freq: Once | SUBCUTANEOUS | Status: DC

## 2013-07-18 MED ORDER — CLOTRIMAZOLE-BETAMETHASONE 1-0.05 % EX CREA: 1.0000 "application " | TOPICAL_CREAM | Freq: Two times a day (BID) | CUTANEOUS | Status: DC

## 2013-07-18 NOTE — Assessment & Plan Note (Signed)
F/u cscope 5 years from 11/14 if pt remains healthy enough for procedure at that time.

## 2013-07-18 NOTE — Assessment & Plan Note (Signed)
LDL has come up since last visit.

## 2013-07-18 NOTE — Assessment & Plan Note (Signed)
Has resolved.  Unclear why he had a period of constipation, but recently bowels are moving daily.

## 2013-07-18 NOTE — Assessment & Plan Note (Signed)
Will add lisinopril 5mg  daily for blood pressure and have him come back for bp check in 2 wks.

## 2013-07-18 NOTE — Progress Notes (Signed)
Patient ID: Thomas Reyes, male   DOB: 1940-07-20, 73 y.o.   MRN: [id number]   Location:  [facility] / [place] [facility]  Code Status: full code   Allergies  Allergen Reactions  . Lipitor [Atorvastatin]     Muscle and bone pain but tolerated pravachol    Chief Complaint  Patient presents with  . Medical Managment of Chronic Issues    f/u & discuss labs. (printed)  . other    need medication refill on a cream for itching, not sure of the name  . Immunizations    declines Flu vaccine and Rx to be printed for the shingles vaccine    HPI: Patient is a 73 y.o. white male seen in the office today for medical mgt of his chronic diseases, review of his labs.    Had a cream for itching in groin area.  Wakes him up and itches himself almost raw.  Flakes off dry skin.    Changed his insurance to [place] so will need new scripts to fax to [facility].    Got colonoscopy done and now needs them every 5 years instead of every 3 years.    Place on left arm that he was concerned about being skin cancer again went away.    Discussed cutting back on fried foods.    Is still off cigarettes.  Doing well with this.    Has some discomfort in his chest after meals--feels it is gas.    Bowels are moving well now.    Gets up 1-2 times at night to urinate.  Depends on how much he drinks in the daytime.  Especially a problem after 8pm.    Review of Systems:  Review of Systems  Constitutional: Negative for fever, chills, weight loss and malaise/fatigue.  HENT: Negative for congestion.   Eyes: Negative for blurred vision.  Respiratory: Negative for shortness of breath.   Cardiovascular: Negative for chest pain and leg swelling.  Gastrointestinal: Positive for heartburn. Negative for abdominal pain, constipation, blood in stool and melena.       Constipation resolved  Musculoskeletal: Negative for falls and myalgias.  Neurological: Negative for dizziness, loss of  consciousness and weakness.  Endo/Heme/Allergies: Bruises/bleeds easily.  Psychiatric/Behavioral: Negative for depression and memory loss.    Past Medical History  Diagnosis Date  . Hypercholesterolemia   . Edema of male genital organs 08/20/2012  . Chest pain, unspecified 08/20/2012  . Nocturia 08/20/2012  . 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 Chest pain, unspecified 12/26/2011  . Diverticulosis of colon (without mention of hemorrhage) 06/14/2010  . Elevated blood pressure reading without diagnosis of hypertension 06/14/2010  . Unspecified constipation 05/11/2009  . Urinary tract infection, site not specified 05/11/2009  . Lumbago 05/11/2009  . Carpal tunnel syndrome 03/16/2009  . Encounter for long-term (current) use of other medications   . 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 Corns and callosities 04/11/2007  . Pain in joint, ankle and foot 04/11/2007  . Ulcer of other part of foot 03/16/2007  . Allergic rhinitis, cause unspecified 09/08/2006  . Pterygium, unspecified 03/01/2006  . Other and unspecified hyperlipidemia 02/24/2006  . COPD (chronic obstructive pulmonary disease) 02/24/2006  . Hypertrophy of prostate without urinary obstruction and other lower urinary tract symptoms (LUTS)   . Dermatophytosis of the body   . Tobacco use disorder   . Disturbance of skin sensation   . Ventral hernia, unspecified, without  mention of obstruction or gangrene   . Seborrheic dermatitis, unspecified 03/21/2001  . Rash and other nonspecific skin eruption 12/28/1999  . Concussion, unspecified 10/30/1994    Past Surgical History  Procedure Laterality Date  . Elbow surgery Right   . Knee surgery Right 01/2007    knee cap injury  . Tonsillectomy  1948  . Fracture surgery  1986    right hip, right elbow    Social History:   reports that he quit smoking about 3 years ago. He does not have any smokeless tobacco history on file. He  reports that he does not drink alcohol or use illicit drugs.  Family History  Problem Relation Age of Onset  . Diabetes Mother   . Hypertension Mother   . Cancer Father     Medications: Patient's Medications  New Prescriptions   CLOTRIMAZOLE-BETAMETHASONE (LOTRISONE) CREAM    Apply 1 application topically 2 (two) times daily. To groin for rash   LISINOPRIL (PRINIVIL,ZESTRIL) 5 MG TABLET    Take 1 tablet (5 mg total) by mouth daily.   SIMVASTATIN (ZOCOR) 20 MG TABLET    Take 1 tablet (20 mg total) by mouth at bedtime.  Previous Medications   ASPIRIN 81 MG TABLET    Take 81 mg by mouth daily. Take one tablet once a day  Modified Medications   Modified Medication Previous Medication   ZOSTER VACCINE LIVE, PF, (ZOSTAVAX) 16109 UNT/0.65ML INJECTION zoster vaccine live, PF, (ZOSTAVAX) 60454 UNT/0.65ML injection      Inject 19,400 Units into the skin once.    Inject 0.65 mLs into the skin once.  Discontinued Medications   PRAVASTATIN (PRAVACHOL) 80 MG TABLET    Take 1 tablet (80 mg total) by mouth daily. Take 1 tablet by mouth at bedtime for cholesterol.     Physical Exam: Filed Vitals:   07/18/13 0833 07/18/13 0933  BP: 146/80 160/84  Pulse: 54   Temp: 97.4 F (36.3 C)   TempSrc: Oral   Weight: 138 lb 6.4 oz (62.778 kg)   SpO2: 96%   Physical Exam  Constitutional: He appears well-developed and well-nourished. No distress.  Thin white male  Cardiovascular: Normal rate, regular rhythm, normal heart sounds and intact distal pulses.   Pulmonary/Chest: Effort normal and breath sounds normal. No respiratory distress.  Abdominal: Soft. Bowel sounds are normal. He exhibits no distension and no mass. There is no tenderness.  Musculoskeletal: Normal range of motion. He exhibits no edema and no tenderness.  No paravertebral muscle tenderness of lumbar region at present  Skin: Skin is warm and dry.  Rhinophyma, area resolved on left arm, dry scaly rash in left groin, no erythema or odor    Psychiatric: He has a normal mood and affect.    Labs reviewed: Basic Metabolic Panel:  Recent Labs  01/08/13 0825  NA 141  K 4.3  CL 106  CO2 25  GLUCOSE 89  BUN 10  CREATININE 1.20  CALCIUM 9.1   Liver Function Tests:  Recent Labs  07/16/13 0812  AST 23  ALT 14  ALKPHOS 59  BILITOT 0.6  PROT 6.6  CBC:  Recent Labs  01/08/13 0825 07/16/13 0812  WBC 5.7 5.9  NEUTROABS 3.2 3.8  HGB 13.6 13.5  HCT 40.9 41.5  MCV 86 80   Lipid Panel:  Recent Labs  01/08/13 0825 07/16/13 0812  HDL 57 56  LDLCALC 82 106*  TRIG 70 70  CHOLHDL 2.7 3.1   Past Procedures: Cscope 11/14:  External  and internal hemorrhoids.  Diverticular disease in sigmoid colon.  Next due in 5 years now.  Assessment/Plan Hypercholesterolemia LDL has come up since last visit.    Rash of groin Will add antifungal cream due to rash.  Does not appear to be yeast.    Personal history of colonic polyps F/u cscope 5 years from 11/14 if pt remains healthy enough for procedure at that time.    Hypertrophy of prostate without urinary obstruction and other lower urinary tract symptoms (LUTS) Symptoms are stable, in fact improved from previous visits w/ only 1-2x nocturia that is affected by when he stops drinking in the evenings.  Must stop before 8pm  Essential hypertension, benign Will add lisinopril 5mg  daily for blood pressure and have him come back for bp check in 2 wks.  Constipation, slow transit Has resolved.  Unclear why he had a period of constipation, but recently bowels are moving daily.  Chest pain Has been associated with flatus and indigestion postprandially.  Cont to monitor    Need for prophylactic vaccination and inoculation against other viral diseases(V04.89) - zoster vaccine live, PF, (ZOSTAVAX) 67672 UNT/0.65ML injection; Inject 19,400 Units into the skin once.  Dispense: 1 each; Refill: 0  Need for prophylactic vaccination against Streptococcus pneumoniae  (pneumococcus) - Pneumococcal conjugate vaccine 13-valent  Labs/tests ordered:  Liver panel, FLP in 3 mos Next appt:  3 mos

## 2013-07-18 NOTE — Assessment & Plan Note (Signed)
Symptoms are stable, in fact improved from previous visits w/ only 1-2x nocturia that is affected by when he stops drinking in the evenings.  Must stop before 8pm

## 2013-07-18 NOTE — Assessment & Plan Note (Signed)
Will add antifungal cream due to rash.  Does not appear to be yeast.

## 2013-07-18 NOTE — Assessment & Plan Note (Signed)
Has been associated with flatus and indigestion postprandially.  Cont to monitor

## 2013-07-29 ENCOUNTER — Other Ambulatory Visit: Payer: Self-pay | Admitting: *Deleted

## 2013-07-29 DIAGNOSIS — E7800 Pure hypercholesterolemia, unspecified: Secondary | ICD-10-CM

## 2013-07-29 DIAGNOSIS — I10 Essential (primary) hypertension: Secondary | ICD-10-CM

## 2013-07-29 MED ORDER — LISINOPRIL 5 MG PO TABS
5.0000 mg | ORAL_TABLET | Freq: Every day | ORAL | Status: DC
Start: 2013-07-29 — End: 2013-07-31

## 2013-07-29 MED ORDER — SIMVASTATIN 20 MG PO TABS: 20.0000 mg | ORAL_TABLET | Freq: Every day | ORAL | Status: DC

## 2013-07-29 MED ORDER — CLOTRIMAZOLE-BETAMETHASONE 1-0.05 % EX CREA: 1.0000 "application " | TOPICAL_CREAM | Freq: Two times a day (BID) | CUTANEOUS | Status: DC

## 2013-07-31 ENCOUNTER — Other Ambulatory Visit: Payer: Self-pay | Admitting: *Deleted

## 2013-07-31 DIAGNOSIS — E7800 Pure hypercholesterolemia, unspecified: Secondary | ICD-10-CM

## 2013-07-31 DIAGNOSIS — I10 Essential (primary) hypertension: Secondary | ICD-10-CM

## 2013-07-31 MED ORDER — LISINOPRIL 5 MG PO TABS: 5.0000 mg | ORAL_TABLET | Freq: Every day | ORAL | Status: DC

## 2013-07-31 MED ORDER — CLOTRIMAZOLE-BETAMETHASONE 1-0.05 % EX CREA: 1.0000 "application " | TOPICAL_CREAM | Freq: Two times a day (BID) | CUTANEOUS | Status: DC

## 2013-07-31 MED ORDER — SIMVASTATIN 20 MG PO TABS
20.0000 mg | ORAL_TABLET | Freq: Every day | ORAL | Status: DC
Start: 2013-07-31 — End: 2014-03-31

## 2013-07-31 NOTE — Telephone Encounter (Signed)
Called [facility] # [phone] and spoke with [facility] Pharmacist and called in patient's Rx's to mail order. ID # [id number] Wife Aware

## 2013-08-13 ENCOUNTER — Other Ambulatory Visit: Payer: Medicare HMO

## 2013-08-15 ENCOUNTER — Ambulatory Visit (INDEPENDENT_AMBULATORY_CARE_PROVIDER_SITE_OTHER): Payer: Medicare HMO

## 2013-08-15 VITALS — BP 144/82 | HR 60 | Wt 138.0 lb

## 2013-08-15 DIAGNOSIS — R030 Elevated blood-pressure reading, without diagnosis of hypertension: Secondary | ICD-10-CM

## 2013-08-15 NOTE — Patient Instructions (Signed)
Per Dr Mariea Clonts:  Will keep BP medication the same and will see him at his upcoming appointment in May.

## 2013-10-16 ENCOUNTER — Other Ambulatory Visit: Payer: Medicare HMO

## 2013-10-18 ENCOUNTER — Ambulatory Visit: Payer: Medicare HMO | Admitting: Internal Medicine

## 2013-11-25 ENCOUNTER — Other Ambulatory Visit: Payer: Commercial Managed Care - HMO

## 2013-11-25 DIAGNOSIS — E7800 Pure hypercholesterolemia, unspecified: Secondary | ICD-10-CM

## 2013-11-26 LAB — LIPID PANEL
Chol/HDL Ratio: 2.8 ratio units (ref 0.0–5.0)
Cholesterol, Total: 159 mg/dL (ref 100–199)
HDL: 57 mg/dL (ref >39)
LDL Calculated: 90 mg/dL (ref 0–99)
Triglycerides: 60 mg/dL (ref 0–149)
VLDL Cholesterol Cal: 12 mg/dL (ref 5–40)

## 2013-11-26 LAB — HEPATIC FUNCTION PANEL
ALT: 9 [IU]/L (ref 0–44)
AST: 15 [IU]/L (ref 0–40)
Albumin: 4.1 g/dL (ref 3.5–4.8)
Alkaline Phosphatase: 54 [IU]/L (ref 39–117)
Bilirubin, Direct: 0.09 mg/dL (ref 0.00–0.40)
Total Bilirubin: 0.3 mg/dL (ref 0.0–1.2)
Total Protein: 6.4 g/dL (ref 6.0–8.5)

## 2013-11-28 ENCOUNTER — Encounter: Payer: Self-pay | Admitting: Internal Medicine

## 2013-11-28 ENCOUNTER — Ambulatory Visit (INDEPENDENT_AMBULATORY_CARE_PROVIDER_SITE_OTHER): Payer: Commercial Managed Care - HMO | Admitting: Internal Medicine

## 2013-11-28 VITALS — BP 140/72 | HR 62 | Temp 97.2°F | Wt 134.0 lb

## 2013-11-28 DIAGNOSIS — I10 Essential (primary) hypertension: Secondary | ICD-10-CM

## 2013-11-28 DIAGNOSIS — Z87828 Personal history of other (healed) physical injury and trauma: Secondary | ICD-10-CM

## 2013-11-28 DIAGNOSIS — R21 Rash and other nonspecific skin eruption: Secondary | ICD-10-CM

## 2013-11-28 DIAGNOSIS — N400 Enlarged prostate without lower urinary tract symptoms: Secondary | ICD-10-CM

## 2013-11-28 DIAGNOSIS — E7800 Pure hypercholesterolemia, unspecified: Secondary | ICD-10-CM

## 2013-11-28 DIAGNOSIS — K5901 Slow transit constipation: Secondary | ICD-10-CM

## 2013-11-28 NOTE — Progress Notes (Signed)
Thomas Reyes ID: Thomas Reyes, male   DOB: 1941/02/29, 73 y.o.   MRN: [id number]   Location:  [facility] / [place] [facility]  Code Status: full code   Allergies  Allergen Reactions  . Lipitor [Atorvastatin]     Muscle and bone pain but tolerated pravachol    Chief Complaint  Thomas Reyes presents with  . Medical Management of Chronic Issues    blood pressure, cholesterol     HPI: Thomas Reyes is a 73 y.o. white male seen in the office today for medical mgt of chronic diseases.  BP is now at goal in 110s-130s/60s with HR 50s-70s.    Says he had an episode of getting dizzy when he worked outside in the 98 degree weather.  Discussed need for hydration when outside in the heat.    Using lotrisone only as needed twice a day--not needed lately.    Reviewed labs with Thomas Reyes:  Cholesterol now at goal and liver panel wnl.    Still has periods of constipation.  Discussed not using senna s or miralax daily--only when needed.  Needs to see eye doctor and needs referral.  Bladder doing ok now.  Denies urinary symptoms this time.  Insurance company was not going to cover the zostavax much, and he was still going to have to pay $200.  Did not get.  Had prevnar in 07/18/13.  Has not yet had pneumococcal 23 valent.  Will need his flu shot at his next appt.  Review of Systems:  Review of Systems  Constitutional: Negative for fever.  HENT: Negative for congestion.   Eyes: Negative for blurred vision.  Respiratory: Negative for cough and shortness of breath.   Cardiovascular: Positive for chest pain.       Brief episodes (workup has been unremarkable)  Gastrointestinal: Negative for abdominal pain, constipation, blood in stool and melena.  Genitourinary: Negative for dysuria.  Musculoskeletal: Negative for falls.  Neurological: Negative for dizziness, loss of consciousness and headaches.  Psychiatric/Behavioral: Negative for depression and memory loss.    Past Medical History    Diagnosis Date  . Hypercholesterolemia   . Edema of male genital organs 08/20/2012  . Chest pain, unspecified 08/20/2012  . Nocturia 08/20/2012  . 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 Chest pain, unspecified 12/26/2011  . Diverticulosis of colon (without mention of hemorrhage) 06/14/2010  . Elevated blood pressure reading without diagnosis of hypertension 06/14/2010  . Unspecified constipation 05/11/2009  . Urinary tract infection, site not specified 05/11/2009  . Lumbago 05/11/2009  . Carpal tunnel syndrome 03/16/2009  . Encounter for long-term (current) use of other medications   . 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 Corns and callosities 04/11/2007  . Pain in joint, ankle and foot 04/11/2007  . Ulcer of other part of foot 03/16/2007  . Allergic rhinitis, cause unspecified 09/08/2006  . Pterygium, unspecified 03/01/2006  . Other and unspecified hyperlipidemia 02/24/2006  . COPD (chronic obstructive pulmonary disease) 02/24/2006  . Hypertrophy of prostate without urinary obstruction and other lower urinary tract symptoms (LUTS)   . Dermatophytosis of the body   . Tobacco use disorder   . Disturbance of skin sensation   . Ventral hernia, unspecified, without mention of obstruction or gangrene   . Seborrheic dermatitis, unspecified 03/21/2001  . Rash and other nonspecific skin eruption 12/28/1999  . Concussion, unspecified 10/30/1994    Past Surgical History  Procedure Laterality Date  . Elbow surgery Right   .  Knee surgery Right 01/2007    knee cap injury  . Tonsillectomy  1948  . Fracture surgery  1986    right hip, right elbow    Social History:   reports that he quit smoking about 3 years ago. He does not have any smokeless tobacco history on file. He reports that he does not drink alcohol or use illicit drugs.  Family History  Problem Relation Age of Onset  . Diabetes Mother   . Hypertension Mother   . Cancer  Father     Medications: Thomas Reyes's Medications  New Prescriptions   No medications on file  Previous Medications   ASPIRIN 81 MG TABLET    Take 81 mg by mouth daily. Take one tablet once a day   CLOTRIMAZOLE-BETAMETHASONE (LOTRISONE) CREAM    Apply 1 application topically 2 (two) times daily. To groin for rash   LISINOPRIL (PRINIVIL,ZESTRIL) 5 MG TABLET    Take 1 tablet (5 mg total) by mouth daily.   SIMVASTATIN (ZOCOR) 20 MG TABLET    Take 1 tablet (20 mg total) by mouth at bedtime.   ZOSTER VACCINE LIVE, PF, (ZOSTAVAX) 68341 UNT/0.65ML INJECTION    Inject 19,400 Units into the skin once.  Modified Medications   No medications on file  Discontinued Medications   No medications on file     Physical Exam: Filed Vitals:   11/28/13 0817  BP: 140/72  Pulse: 62  Temp: 97.2 F (36.2 C)  TempSrc: Oral  Weight: 134 lb (60.782 kg)  Physical Exam  Constitutional: He is oriented to person, place, and time. He appears well-developed and well-nourished. No distress.  Cardiovascular: Normal rate, regular rhythm, normal heart sounds and intact distal pulses.   Pulmonary/Chest: Effort normal and breath sounds normal. No respiratory distress.  Abdominal: Soft. Bowel sounds are normal. He exhibits no distension and no mass. There is no tenderness.  Musculoskeletal: Normal range of motion. He exhibits no tenderness.  Neurological: He is alert and oriented to person, place, and time.  Psychiatric: He has a normal mood and affect.    Labs reviewed: Basic Metabolic Panel:  Recent Labs  01/08/13 0825  NA 141  K 4.3  CL 106  CO2 25  GLUCOSE 89  BUN 10  CREATININE 1.20  CALCIUM 9.1   Liver Function Tests:  Recent Labs  07/16/13 0812 11/25/13 0807  AST 23 15  ALT 14 9  ALKPHOS 59 54  BILITOT 0.6 0.3  PROT 6.6 6.4  CBC:  Recent Labs  01/08/13 0825 07/16/13 0812  WBC 5.7 5.9  NEUTROABS 3.2 3.8  HGB 13.6 13.5  HCT 40.9 41.5  MCV 86 80   Lipid Panel:  Recent Labs   01/08/13 0825 07/16/13 0812 11/25/13 0807  HDL 57 56 57  LDLCALC 82 106* 90  TRIG 70 70 60  CHOLHDL 2.7 3.1 2.8   Assessment/Plan 1. Essential hypertension, benign -bp at goal with lisinopril 5mg   -advised he must continue this though (bp still in 140s here)  2. Hypercholesterolemia -cholesterol at goal with simvastatin 20mg   3. Rash of groin -better right now with use of cream off and on  4. Constipation, slow transit -discussed intermittent use of miralax and senna-s is ok, but not daily use -encouraged high fiber diet with fluid intake  5. Hypertrophy of prostate without urinary obstruction and other lower urinary tract symptoms (LUTS) Symptoms improved lately  6. History of non-penetrating eye injury Needed referral back to Dr. Zenia Resides office -already followed there -  Ambulatory referral to Ophthalmology  Labs/tests ordered:   Orders Placed This Encounter  Procedures  . Ambulatory referral to Ophthalmology    Referral Priority:  Routine    Referral Type:  Consultation    Referral Reason:  Specialty Services Required    Requested Specialty:  Ophthalmology    Number of Visits Requested:  1    Next appt:  4 mos for annual exam

## 2013-12-03 ENCOUNTER — Emergency Department (HOSPITAL_COMMUNITY): Payer: Medicare HMO

## 2013-12-03 ENCOUNTER — Encounter (HOSPITAL_COMMUNITY): Payer: Self-pay | Admitting: Emergency Medicine

## 2013-12-03 ENCOUNTER — Emergency Department (HOSPITAL_COMMUNITY)
Admission: EM | Admit: 2013-12-03 | Discharge: 2013-12-03 | Disposition: A | Discharge status: Home / Self Care | Payer: Medicare HMO | Attending: Emergency Medicine | Admitting: Emergency Medicine

## 2013-12-03 DIAGNOSIS — Y9389 Activity, other specified: Secondary | ICD-10-CM | POA: Diagnosis not present

## 2013-12-03 DIAGNOSIS — Z872 Personal history of diseases of the skin and subcutaneous tissue: Secondary | ICD-10-CM | POA: Insufficient documentation

## 2013-12-03 DIAGNOSIS — S2191XA Laceration without foreign body of unspecified part of thorax, initial encounter: Secondary | ICD-10-CM

## 2013-12-03 DIAGNOSIS — Z8619 Personal history of other infectious and parasitic diseases: Secondary | ICD-10-CM | POA: Diagnosis not present

## 2013-12-03 DIAGNOSIS — Z7982 Long term (current) use of aspirin: Secondary | ICD-10-CM | POA: Insufficient documentation

## 2013-12-03 DIAGNOSIS — W298XXA Contact with other powered powered hand tools and household machinery, initial encounter: Secondary | ICD-10-CM | POA: Insufficient documentation

## 2013-12-03 DIAGNOSIS — Z87891 Personal history of nicotine dependence: Secondary | ICD-10-CM | POA: Diagnosis not present

## 2013-12-03 DIAGNOSIS — J449 Chronic obstructive pulmonary disease, unspecified: Secondary | ICD-10-CM | POA: Insufficient documentation

## 2013-12-03 DIAGNOSIS — Z8669 Personal history of other diseases of the nervous system and sense organs: Secondary | ICD-10-CM | POA: Diagnosis not present

## 2013-12-03 DIAGNOSIS — Z85828 Personal history of other malignant neoplasm of skin: Secondary | ICD-10-CM | POA: Insufficient documentation

## 2013-12-03 DIAGNOSIS — Y9289 Other specified places as the place of occurrence of the external cause: Secondary | ICD-10-CM | POA: Diagnosis not present

## 2013-12-03 DIAGNOSIS — Y990 Civilian activity done for income or pay: Secondary | ICD-10-CM | POA: Insufficient documentation

## 2013-12-03 DIAGNOSIS — Z79899 Other long term (current) drug therapy: Secondary | ICD-10-CM | POA: Insufficient documentation

## 2013-12-03 DIAGNOSIS — E7800 Pure hypercholesterolemia, unspecified: Secondary | ICD-10-CM | POA: Diagnosis not present

## 2013-12-03 DIAGNOSIS — S21109A Unspecified open wound of unspecified front wall of thorax without penetration into thoracic cavity, initial encounter: Secondary | ICD-10-CM | POA: Diagnosis not present

## 2013-12-03 DIAGNOSIS — IMO0002 Reserved for concepts with insufficient information to code with codable children: Secondary | ICD-10-CM | POA: Diagnosis not present

## 2013-12-03 DIAGNOSIS — E785 Hyperlipidemia, unspecified: Secondary | ICD-10-CM | POA: Insufficient documentation

## 2013-12-03 DIAGNOSIS — Z87448 Personal history of other diseases of urinary system: Secondary | ICD-10-CM | POA: Insufficient documentation

## 2013-12-03 DIAGNOSIS — Z23 Encounter for immunization: Secondary | ICD-10-CM | POA: Diagnosis not present

## 2013-12-03 DIAGNOSIS — Z8719 Personal history of other diseases of the digestive system: Secondary | ICD-10-CM | POA: Diagnosis not present

## 2013-12-03 DIAGNOSIS — J4489 Other specified chronic obstructive pulmonary disease: Secondary | ICD-10-CM | POA: Insufficient documentation

## 2013-12-03 DIAGNOSIS — Z87440 Personal history of urinary (tract) infections: Secondary | ICD-10-CM | POA: Diagnosis not present

## 2013-12-03 MED ORDER — ONDANSETRON HCL 4 MG/2ML IJ SOLN
4.0000 mg | Freq: Once | INTRAMUSCULAR | Status: AC
  Administered 2013-12-03: 4 mg via INTRAVENOUS
  Filled 2013-12-03: qty 2

## 2013-12-03 MED ORDER — HYDROMORPHONE HCL PF 1 MG/ML IJ SOLN
1.0000 mg | Freq: Once | INTRAMUSCULAR | Status: AC
  Administered 2013-12-03: 1 mg via INTRAVENOUS
  Filled 2013-12-03: qty 1

## 2013-12-03 MED ORDER — TRAMADOL HCL 50 MG PO TABS: 50.0000 mg | ORAL_TABLET | Freq: Four times a day (QID) | ORAL | Status: DC | PRN

## 2013-12-03 MED ORDER — CEPHALEXIN 500 MG PO CAPS: 500.0000 mg | ORAL_CAPSULE | Freq: Three times a day (TID) | ORAL | Status: DC

## 2013-12-03 MED ORDER — CEFAZOLIN SODIUM 1-5 GM-% IV SOLN
1.0000 g | Freq: Once | INTRAVENOUS | Status: AC
  Administered 2013-12-03: 1 g via INTRAVENOUS
  Filled 2013-12-03: qty 50

## 2013-12-03 MED ORDER — TETANUS-DIPHTH-ACELL PERTUSSIS 5-2.5-18.5 LF-MCG/0.5 IM SUSP
0.5000 mL | Freq: Once | INTRAMUSCULAR | Status: AC
  Administered 2013-12-03: 0.5 mL via INTRAMUSCULAR
  Filled 2013-12-03: qty 0.5

## 2013-12-03 NOTE — Discharge Instructions (Signed)
Keep wound very clean/dry. Have sutures removed, your doctor or urgent care, in 10-12 days.  Take motrin or aleve as need. You may also take ultram as need for pain - no driving when taking ultram. Take keflex (antibiotic) to help prevent wound infection. Return to ER right away if worse, infection of wound (spreading redness, increased swelling, pus from wound), fevers, chest pain, trouble breathing, other concern.  You were given pain medication in the ER - no driving for the next 4 hours.     Deep Skin Avulsion A deep skin avulsion is when all layers of the skin or parts of body structures have been torn away. This is usually a result of severe injury (trauma). A deep skin avulsion can include damage to important structures beneath the skin such as tendons, ligaments, nerves, or blood vessels.  CAUSES  Many injuries can lead to a deep skin avulsion. These include:   Crush injuries.  Bites.  Falls against jagged surfaces.  Gunshot wounds.  Severe burns and injuries involving dragging (such as those from a bicycle or motorcycle accident). TREATMENT   If the wound is small and there is no damage to vital structures like nerves and blood vessels, the damaged tissues may be removed. Then, the wound can be cleaned thoroughly and closed.  A skin graft may be performed. This is a procedure in which the outer layer of skin is removed from a different part of your body. That skin (skin graft) is used to cover the open wound. This can happen after damaged tissue is removed and repairs are completed.  Your caregiver may onlyapply a bandage (dressing) to the wound. The wound will be kept clean and allowed to heal. Healing can take weeks or months and usually leaves a large scar. This type of treatment is only done if your caregiver feels that skin grafting or a similar procedure would not work. You might need a tetanus shot if:  You cannot remember when you had your last tetanus shot.  You  have never had a tetanus shot.  The injury broke your skin. If you got a tetanus shot, your arm may swell, get red, and feel warm to the touch. This is common and not a problem. If you need a tetanus shot and you choose not to have one, there is a rare chance of getting tetanus. Sickness from tetanus can be serious. HOME CARE INSTRUCTIONS   Only take over-the-counter or prescription medicines for pain, discomfort, or fever as directed by your caregiver.  Gently wash the area with mild soap and water 2 times a day, or as directed. Rinse off the soap. Pat the area dry with a clean towel. Do not rub the wound. This may cause bleeding.  Follow your caregiver's instructions for how often you need to change the dressing.  Apply ointment and a dressing to the wound as directed.  If the dressing sticks, moisten it with soapy water and gently remove it.  Change the bandage right away if it becomes wet, dirty, or starts to smell bad.  Take showers. Do not take tub baths, swim, or do anything that may soak the wound until it is healed.  Use anti-itch medicine as directed by your caregiver. The wound may itch when it is healing. Do not pick or scratch at the wound.  Follow up with your caregiver for stitches (sutures), staple, or skin adhesive strip removal. SEEK MEDICAL CARE IF:   You have redness, swelling, or increasing pain  in your wound.  A red streak or line extends away from the wound.  You have pus coming from the wound.  You notice a bad smell coming from thewound or dressing.  The wound breaks open (edges not staying together) after sutures have been removed.  You notice something coming out of the wound, such as a small piece of wood, glass, or metal.  You are unable to properly move a finger or toe if the wound is on your hand or foot.  You have severe swelling around the wound that causes pain and numbness.  Your arm, hand, leg, or foot changes color. SEEK IMMEDIATE  MEDICAL CARE IF:   Your pain becomes severe or is not adequately relieved with pain medicine.  You have a fever.  You have nausea and vomiting for more than 24 hours.  You feel lightheaded, weak, or faint.  You develop chest pain or difficulty breathing. MAKE SURE YOU:   Understand these instructions.  Will watch your condition.  Will get help right away if you are not doing well or get worse. Document Released: 07/19/2006 Document Revised: 08/15/2011 Document Reviewed: 09/26/2010 [facility] Patient Information 2015 [place], [place]. This information is not intended to replace advice given to you by your health care provider. Make sure you discuss any questions you have with your health care provider.    Laceration Care, Adult A laceration is a cut or lesion that goes through all layers of the skin and into the tissue just beneath the skin. TREATMENT  Some lacerations may not require closure. Some lacerations may not be able to be closed due to an increased risk of infection. It is important to see your caregiver as soon as possible after an injury to minimize the risk of infection and maximize the opportunity for successful closure. If closure is appropriate, pain medicines may be given, if needed. The wound will be cleaned to help prevent infection. Your caregiver will use stitches (sutures), staples, wound glue (adhesive), or skin adhesive strips to repair the laceration. These tools bring the skin edges together to allow for faster healing and a better cosmetic outcome. However, all wounds will heal with a scar. Once the wound has healed, scarring can be minimized by covering the wound with sunscreen during the day for 1 full year. HOME CARE INSTRUCTIONS  For sutures or staples:  Keep the wound clean and dry.  If you were given a bandage (dressing), you should change it at least once a day. Also, change the dressing if it becomes wet or dirty, or as directed by your  caregiver.  Wash the wound with soap and water 2 times a day. Rinse the wound off with water to remove all soap. Pat the wound dry with a clean towel.  After cleaning, apply a thin layer of the antibiotic ointment as recommended by your caregiver. This will help prevent infection and keep the dressing from sticking.  You may shower as usual after the first 24 hours. Do not soak the wound in water until the sutures are removed.  Only take over-the-counter or prescription medicines for pain, discomfort, or fever as directed by your caregiver.  Get your sutures or staples removed as directed by your caregiver. For skin adhesive strips:  Keep the wound clean and dry.  Do not get the skin adhesive strips wet. You may bathe carefully, using caution to keep the wound dry.  If the wound gets wet, pat it dry with a clean towel.  Skin  adhesive strips will fall off on their own. You may trim the strips as the wound heals. Do not remove skin adhesive strips that are still stuck to the wound. They will fall off in time. For wound adhesive:  You may briefly wet your wound in the shower or bath. Do not soak or scrub the wound. Do not swim. Avoid periods of heavy perspiration until the skin adhesive has fallen off on its own. After showering or bathing, gently pat the wound dry with a clean towel.  Do not apply liquid medicine, cream medicine, or ointment medicine to your wound while the skin adhesive is in place. This may loosen the film before your wound is healed.  If a dressing is placed over the wound, be careful not to apply tape directly over the skin adhesive. This may cause the adhesive to be pulled off before the wound is healed.  Avoid prolonged exposure to sunlight or tanning lamps while the skin adhesive is in place. Exposure to ultraviolet light in the first year will darken the scar.  The skin adhesive will usually remain in place for 5 to 10 days, then naturally fall off the skin. Do  not pick at the adhesive film. You may need a tetanus shot if:  You cannot remember when you had your last tetanus shot.  You have never had a tetanus shot. If you get a tetanus shot, your arm may swell, get red, and feel warm to the touch. This is common and not a problem. If you need a tetanus shot and you choose not to have one, there is a rare chance of getting tetanus. Sickness from tetanus can be serious. SEEK MEDICAL CARE IF:   You have redness, swelling, or increasing pain in the wound.  You see a red line that goes away from the wound.  You have yellowish-white fluid (pus) coming from the wound.  You have a fever.  You notice a bad smell coming from the wound or dressing.  Your wound breaks open before or after sutures have been removed.  You notice something coming out of the wound such as wood or glass.  Your wound is on your hand or foot and you cannot move a finger or toe. SEEK IMMEDIATE MEDICAL CARE IF:   Your pain is not controlled with prescribed medicine.  You have severe swelling around the wound causing pain and numbness or a change in color in your arm, hand, leg, or foot.  Your wound splits open and starts bleeding.  You have worsening numbness, weakness, or loss of function of any joint around or beyond the wound.  You develop painful lumps near the wound or on the skin anywhere on your body. MAKE SURE YOU:   Understand these instructions.  Will watch your condition.  Will get help right away if you are not doing well or get worse. Document Released: 05/23/2005 Document Revised: 08/15/2011 Document Reviewed: 11/16/2010 [facility] Patient Information 2015 [place], [place]. This information is not intended to replace advice given to you by your health care provider. Make sure you discuss any questions you have with your health care provider.

## 2013-12-03 NOTE — ED Provider Notes (Signed)
1:17 PM I repaired the patient's laceration of left chest without difficulty.   LACERATION REPAIR Performed by: Alvina Chou Authorized by: Alvina Chou Consent: Verbal consent obtained. Risks and benefits: risks, benefits and alternatives were discussed Consent given by: patient Patient identity confirmed: provided demographic data Prepped and Draped in normal sterile fashion Wound explored  Laceration Location: left chest  Laceration Length: 7 cm  No Foreign Bodies seen or palpated  Anesthesia: local infiltration  Local anesthetic: lidocaine 1% with epinephrine  Anesthetic total: 12 ml  Irrigation method: syringe Amount of cleaning: standard  Skin closure: 4-0 vicryl rapid (deep), 4-0 vicryl (superficial)  Number of sutures: 6 deep, 9 superficial  Technique: simple  Patient tolerance: Patient tolerated the procedure well with no immediate complications.   Alvina Chou, PA-C 12/03/13 1320

## 2013-12-03 NOTE — ED Notes (Signed)
Pt here with severe traumatic laceration of left chest by cutting wheel. Pt states that he was using a cutting wheel to cut metal and it "got away from him". C/o of nausea, no vomiting.

## 2013-12-03 NOTE — ED Provider Notes (Signed)
CSN: [id number]     Arrival date & time 12/03/13  1102 History   First MD Initiated Contact with Patient 12/03/13 1105     Chief Complaint  Patient presents with  . Laceration  . Trauma     (Consider location/radiation/quality/duration/timing/severity/associated sxs/prior Treatment) Patient is a 73 y.o. male presenting with skin laceration and trauma. The history is provided by the patient.  Laceration Trauma   Current symptoms:      Associated symptoms:            Denies abdominal pain, back pain, chest pain, headache, neck pain and vomiting.  pt c/o injury to chest wall at work just pta today.  Was handing a 'grinder' up to coworker, it was on, accidentally got dropped a few feet hitting left chest causing chest wall injury/laceration. No sob. No pleuritic pain. No abd pain. Denies any other pain or injury.  Tetanus unknown. Pain localized to lac, constant, dull, moderate, worse palpation.     Past Medical History  Diagnosis Date  . Hypercholesterolemia   . Edema of male genital organs 08/20/2012  . Chest pain, unspecified 08/20/2012  . Nocturia 08/20/2012  . 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 Chest pain, unspecified 12/26/2011  . Diverticulosis of colon (without mention of hemorrhage) 06/14/2010  . Elevated blood pressure reading without diagnosis of hypertension 06/14/2010  . Unspecified constipation 05/11/2009  . Urinary tract infection, site not specified 05/11/2009  . Lumbago 05/11/2009  . Carpal tunnel syndrome 03/16/2009  . Encounter for long-term (current) use of other medications   . 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 Corns and callosities 04/11/2007  . Pain in joint, ankle and foot 04/11/2007  . Ulcer of other part of foot 03/16/2007  . Allergic rhinitis, cause unspecified 09/08/2006  . Pterygium, unspecified 03/01/2006  . Other and unspecified hyperlipidemia 02/24/2006  . COPD (chronic obstructive  pulmonary disease) 02/24/2006  . Hypertrophy of prostate without urinary obstruction and other lower urinary tract symptoms (LUTS)   . Dermatophytosis of the body   . Tobacco use disorder   . Disturbance of skin sensation   . Ventral hernia, unspecified, without mention of obstruction or gangrene   . Seborrheic dermatitis, unspecified 03/21/2001  . Rash and other nonspecific skin eruption 12/28/1999  . Concussion, unspecified 10/30/1994   Past Surgical History  Procedure Laterality Date  . Elbow surgery Right   . Knee surgery Right 01/2007    knee cap injury  . Tonsillectomy  1948  . Fracture surgery  1986    right hip, right elbow   Family History  Problem Relation Age of Onset  . Diabetes Mother   . Hypertension Mother   . Cancer Father    History  Substance Use Topics  . Smoking status: Former Smoker    Quit date: 06/09/2010  . Smokeless tobacco: Not on file  . Alcohol Use: No    Review of Systems  Constitutional: Negative for fever.  HENT: Negative for nosebleeds.   Eyes: Negative for redness.  Respiratory: Negative for shortness of breath.   Cardiovascular: Negative for chest pain.  Gastrointestinal: Negative for vomiting and abdominal pain.  Genitourinary: Negative for flank pain.  Musculoskeletal: Negative for back pain and neck pain.  Skin: Negative for rash.  Neurological: Negative for headaches.  Hematological: Does not bruise/bleed easily.  Psychiatric/Behavioral: Negative for confusion.      Allergies  Lipitor  Home Medications   Prior to Admission medications  Medication Sig Start Date End Date Taking? Authorizing Provider  aspirin 81 MG tablet Take 81 mg by mouth daily. Take one tablet once a day    Historical Provider, MD  clotrimazole-betamethasone (LOTRISONE) cream Apply 1 application topically 2 (two) times daily. To groin for rash 07/31/13   Tiffany L Reed, DO  lisinopril (PRINIVIL,ZESTRIL) 5 MG tablet Take 1 tablet (5 mg total) by mouth  daily. 07/31/13   Tiffany L Reed, DO  simvastatin (ZOCOR) 20 MG tablet Take 1 tablet (20 mg total) by mouth at bedtime. 07/31/13   Tiffany L Reed, DO  zoster vaccine live, PF, (ZOSTAVAX) 19379 UNT/0.65ML injection Inject 19,400 Units into the skin once. 07/18/13   Tiffany L Reed, DO   BP 121/65  Pulse 56  Resp 18  SpO2 96% Physical Exam  Nursing note and vitals reviewed. Constitutional: He is oriented to person, place, and time. He appears well-developed and well-nourished. No distress.  HENT:  Head: Atraumatic.  Mouth/Throat: Oropharynx is clear and moist.  Eyes: Conjunctivae are normal. Pupils are equal, round, and reactive to light.  Neck: Neck supple. No tracheal deviation present.  Cardiovascular: Normal rate, regular rhythm, normal heart sounds and intact distal pulses.   Pulmonary/Chest: Effort normal and breath sounds normal. No accessory muscle usage. No respiratory distress. He exhibits tenderness.  Symmetric excursion, no crepitus.   Abdominal: Soft. Bowel sounds are normal. He exhibits no distension and no mass. There is no tenderness. There is no rebound and no guarding.  Musculoskeletal: Normal range of motion. He exhibits no edema and no tenderness.  Neurological: He is alert and oriented to person, place, and time.  Skin: Skin is warm and dry. He is not diaphoretic.  Psychiatric: He has a normal mood and affect.    ED Course  Procedures (including critical care time) Labs Review  Dg Chest 2 View  12/03/2013   CLINICAL DATA:  grinder to chest, evaluate for pneumothorax, rib fracture.  EXAM: CHEST  2 VIEW  COMPARISON:  Prior radiograph from 10/01/2011  FINDINGS: Cardiac and mediastinal silhouettes are stable in size and contour, and remain within normal limits.  Lungs are normally inflated. Irregular biapical pleural thickening with architectural distortion at the right lung apex is stable from prior. No pneumothorax. Wedge-shaped hazy opacity within the peripheral left lung  base on frontal projection is favored to reflect atelectasis. No focal infiltrates identified. No pulmonary edema or pleural effusion.  Diffuse osteopenia present. No acute rib fracture identified. Remotely healed left-sided rib fractures noted.  IMPRESSION: 1. No pneumothorax or acute rib fracture identified. 2. Hazy wedge-shaped opacity within the peripheral left lung base, favored to reflect mild left basilar atelectasis.   Electronically Signed   By: Jeannine Boga M.D.   On: 12/03/2013 12:21      MDM  Iv ns. Labs. Dilaudid 1 mg iv. zofran iv.  Tetanus im.  Reviewed nursing notes and prior charts for additional history.   Recheck pt, no increased wob, no chest pain (x pain localized to skin/soft tissue wound).  Ancef 1 gm iv.    See laceration note per PA - wound closed by PA Szekalski.  Sterile dressing.     Mirna Mires, MD 12/03/13 [phone]

## 2013-12-05 NOTE — ED Provider Notes (Signed)
Medical screening examination/treatment/procedure(s) were conducted as a shared visit with non-physician practitioner(s) and myself.  I personally evaluated the patient during the encounter.   EKG Interpretation   Date/Time:  Tuesday December 03 2013 11:12:57 EDT Ventricular Rate:  53 PR Interval:  163 QRS Duration: 77 QT Interval:  424 QTC Calculation: 398 R Axis:   33 Text Interpretation:  Sinus rhythm No previous tracing Confirmed by Ashok Cordia   MD, Lennette Bihari ([id number]) on 12/03/2013 12:20:10 PM      See H and P by me.   Mirna Mires, MD 12/05/13 [phone]

## 2013-12-13 ENCOUNTER — Emergency Department (HOSPITAL_COMMUNITY)
Admission: EM | Admit: 2013-12-13 | Discharge: 2013-12-13 | Disposition: A | Discharge status: Home / Self Care | Payer: Medicare HMO | Attending: Emergency Medicine | Admitting: Emergency Medicine

## 2013-12-13 ENCOUNTER — Encounter (HOSPITAL_COMMUNITY): Payer: Self-pay | Admitting: Emergency Medicine

## 2013-12-13 DIAGNOSIS — Z8719 Personal history of other diseases of the digestive system: Secondary | ICD-10-CM | POA: Diagnosis not present

## 2013-12-13 DIAGNOSIS — Z8781 Personal history of (healed) traumatic fracture: Secondary | ICD-10-CM | POA: Insufficient documentation

## 2013-12-13 DIAGNOSIS — Z87891 Personal history of nicotine dependence: Secondary | ICD-10-CM | POA: Diagnosis not present

## 2013-12-13 DIAGNOSIS — Z872 Personal history of diseases of the skin and subcutaneous tissue: Secondary | ICD-10-CM | POA: Diagnosis not present

## 2013-12-13 DIAGNOSIS — Z87440 Personal history of urinary (tract) infections: Secondary | ICD-10-CM | POA: Insufficient documentation

## 2013-12-13 DIAGNOSIS — Z792 Long term (current) use of antibiotics: Secondary | ICD-10-CM | POA: Insufficient documentation

## 2013-12-13 DIAGNOSIS — Z87448 Personal history of other diseases of urinary system: Secondary | ICD-10-CM | POA: Diagnosis not present

## 2013-12-13 DIAGNOSIS — Z8669 Personal history of other diseases of the nervous system and sense organs: Secondary | ICD-10-CM | POA: Insufficient documentation

## 2013-12-13 DIAGNOSIS — Z4801 Encounter for change or removal of surgical wound dressing: Secondary | ICD-10-CM | POA: Diagnosis present

## 2013-12-13 DIAGNOSIS — Z8619 Personal history of other infectious and parasitic diseases: Secondary | ICD-10-CM | POA: Insufficient documentation

## 2013-12-13 DIAGNOSIS — Z7982 Long term (current) use of aspirin: Secondary | ICD-10-CM | POA: Insufficient documentation

## 2013-12-13 DIAGNOSIS — Z85828 Personal history of other malignant neoplasm of skin: Secondary | ICD-10-CM | POA: Insufficient documentation

## 2013-12-13 DIAGNOSIS — E7800 Pure hypercholesterolemia, unspecified: Secondary | ICD-10-CM | POA: Insufficient documentation

## 2013-12-13 DIAGNOSIS — Z5189 Encounter for other specified aftercare: Secondary | ICD-10-CM | POA: Diagnosis not present

## 2013-12-13 DIAGNOSIS — Z79899 Other long term (current) drug therapy: Secondary | ICD-10-CM | POA: Diagnosis not present

## 2013-12-13 DIAGNOSIS — Z4802 Encounter for removal of sutures: Secondary | ICD-10-CM

## 2013-12-13 DIAGNOSIS — J4489 Other specified chronic obstructive pulmonary disease: Secondary | ICD-10-CM | POA: Insufficient documentation

## 2013-12-13 DIAGNOSIS — Z8739 Personal history of other diseases of the musculoskeletal system and connective tissue: Secondary | ICD-10-CM | POA: Insufficient documentation

## 2013-12-13 DIAGNOSIS — N400 Enlarged prostate without lower urinary tract symptoms: Secondary | ICD-10-CM | POA: Insufficient documentation

## 2013-12-13 DIAGNOSIS — J449 Chronic obstructive pulmonary disease, unspecified: Secondary | ICD-10-CM | POA: Diagnosis not present

## 2013-12-13 NOTE — ED Provider Notes (Signed)
CSN: [id number]     Arrival date & time 12/13/13  0865 History   First MD Initiated Contact with Patient 12/13/13 1000     Chief Complaint  Patient presents with  . Suture / Staple Removal     (Consider location/radiation/quality/duration/timing/severity/associated sxs/prior Treatment) HPI Comments: Patient presents today to have sutures removed from his left chest wall.  Sutures were placed in the ED ten days ago.  He is currently on Keflex and reports that he has one more dose left.  He states that he has been taking the Keflex as directed.  He denies fever, chills, nausea, vomiting, chest pain, cough, or SOB.  His tetanus was updated at his initial visit.  He denies any drainage from the wound.  Denies any surrounding warmth or edema.    The history is provided by the patient.    Past Medical History  Diagnosis Date  . Hypercholesterolemia   . Edema of male genital organs 08/20/2012  . Chest pain, unspecified 08/20/2012  . Nocturia 08/20/2012  . 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 Chest pain, unspecified 12/26/2011  . Diverticulosis of colon (without mention of hemorrhage) 06/14/2010  . Elevated blood pressure reading without diagnosis of hypertension 06/14/2010  . Unspecified constipation 05/11/2009  . Urinary tract infection, site not specified 05/11/2009  . Lumbago 05/11/2009  . Carpal tunnel syndrome 03/16/2009  . Encounter for long-term (current) use of other medications   . 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 Corns and callosities 04/11/2007  . Pain in joint, ankle and foot 04/11/2007  . Ulcer of other part of foot 03/16/2007  . Allergic rhinitis, cause unspecified 09/08/2006  . Pterygium, unspecified 03/01/2006  . Other and unspecified hyperlipidemia 02/24/2006  . COPD (chronic obstructive pulmonary disease) 02/24/2006  . Hypertrophy of prostate without urinary obstruction and other lower urinary tract symptoms  (LUTS)   . Dermatophytosis of the body   . Tobacco use disorder   . Disturbance of skin sensation   . Ventral hernia, unspecified, without mention of obstruction or gangrene   . Seborrheic dermatitis, unspecified 03/21/2001  . Rash and other nonspecific skin eruption 12/28/1999  . Concussion, unspecified 10/30/1994   Past Surgical History  Procedure Laterality Date  . Elbow surgery Right   . Knee surgery Right 01/2007    knee cap injury  . Tonsillectomy  1948  . Fracture surgery  1986    right hip, right elbow   Family History  Problem Relation Age of Onset  . Diabetes Mother   . Hypertension Mother   . Cancer Father    History  Substance Use Topics  . Smoking status: Former Smoker    Quit date: 06/09/2010  . Smokeless tobacco: Not on file  . Alcohol Use: No    Review of Systems  Constitutional: Negative for fever and chills.  Respiratory: Negative for cough and shortness of breath.   Cardiovascular: Negative for chest pain.      Allergies  Lipitor  Home Medications   Prior to Admission medications   Medication Sig Start Date End Date Taking? Authorizing Provider  aspirin 81 MG tablet Take 81 mg by mouth daily. Take one tablet once a day    Historical Provider, MD  cephALEXin (KEFLEX) 500 MG capsule Take 1 capsule (500 mg total) by mouth 3 (three) times daily. 12/03/13   Mirna Mires, MD  lisinopril (PRINIVIL,ZESTRIL) 5 MG tablet Take 1 tablet (5 mg total) by mouth  daily. 07/31/13   Tiffany L Reed, DO  simvastatin (ZOCOR) 20 MG tablet Take 1 tablet (20 mg total) by mouth at bedtime. 07/31/13   Tiffany L Reed, DO  traMADol (ULTRAM) 50 MG tablet Take 1 tablet (50 mg total) by mouth every 6 (six) hours as needed. 12/03/13   Mirna Mires, MD   BP 142/79  Pulse 77  Temp(Src) 98 F (36.7 C) (Oral)  Resp 18  SpO2 94% Physical Exam  Nursing note and vitals reviewed. Constitutional: He appears well-developed and well-nourished.  HENT:  Head: Normocephalic and  atraumatic.  Cardiovascular: Normal rate, regular rhythm and normal heart sounds.   Pulmonary/Chest: Effort normal and breath sounds normal.    Neurological: He is alert.  Skin: Skin is warm and dry.  Psychiatric: He has a normal mood and affect.    ED Course  SUTURE REMOVAL Date/Time: 12/13/2013 10:33 AM Performed by: Hyman Bible Authorized by: Hyman Bible Consent: Verbal consent obtained. Risks and benefits: risks, benefits and alternatives were discussed Consent given by: patient Patient understanding: patient states understanding of the procedure being performed Patient consent: the patient's understanding of the procedure matches consent given Procedure consent: procedure consent matches procedure scheduled Patient identity confirmed: verbally with patient Location: left chest. Wound Appearance: tender, clean and indurated Sutures Removed: 9 Post-removal: dressing applied, Steri-Strips applied and antibiotic ointment applied Facility: sutures placed in this facility Patient tolerance: Patient tolerated the procedure well with no immediate complications.   (including critical care time) Labs Review Labs Reviewed - No data to display  Imaging Review No results found.   EKG Interpretation None      MDM   Final diagnoses:  None   Patient presenting for suture removal.  Sutures placed in the ED ten days ago.  He is currently on Keflex.  Patient afebrile.  No signs of infection at this time.  Sutures removed without difficulty.  Feel that the patient is stable for discharge.  Return precautions discussed.    Hyman Bible, PA-C 12/13/13 1145

## 2013-12-13 NOTE — ED Notes (Signed)
Pt here to get sutures removed on his left chest from laceration he had 10 days ago.

## 2013-12-13 NOTE — Discharge Instructions (Signed)
Continue taking your antibiotic Suture Removal, Care After Refer to this sheet in the next few weeks. These instructions provide you with information on caring for yourself after your procedure. Your health care provider may also give you more specific instructions. Your treatment has been planned according to current medical practices, but problems sometimes occur. Call your health care provider if you have any problems or questions after your procedure. WHAT TO EXPECT AFTER THE PROCEDURE After your stitches (sutures) are removed, it is typical to have the following:  Some discomfort and swelling in the wound area.  Slight redness in the area. HOME CARE INSTRUCTIONS   If you have skin adhesive strips over the wound area, do not take the strips off. They will fall off on their own in a few days. If the strips remain in place after 14 days, you may remove them.  Change any bandages (dressings) at least once a day or as directed by your health care provider. If the bandage sticks, soak it off with warm, soapy water.  Apply cream or ointment only as directed by your health care provider. If using cream or ointment, wash the area with soap and water 2 times a day to remove all the cream or ointment. Rinse off the soap and pat the area dry with a clean towel.  Keep the wound area dry and clean. If the bandage becomes wet or dirty, or if it develops a bad smell, change it as soon as possible.  Continue to protect the wound from injury.  Use sunscreen when out in the sun. New scars become sunburned easily. SEEK MEDICAL CARE IF:  You have increasing redness, swelling, or pain in the wound.  You see pus coming from the wound.  You have a fever.  You notice a bad smell coming from the wound or dressing.  Your wound breaks open (edges not staying together). Document Released: 02/15/2001 Document Revised: 03/13/2013 Document Reviewed: 01/02/2013 [facility] Patient Information 2015 [place],  [place]. This information is not intended to replace advice given to you by your health care provider. Make sure you discuss any questions you have with your health care provider.

## 2013-12-14 NOTE — ED Provider Notes (Signed)
Medical screening examination/treatment/procedure(s) were performed by non-physician practitioner and as supervising physician I was immediately available for consultation/collaboration.   EKG Interpretation None        Alfonzo Feller, DO 12/14/13 1425

## 2014-03-31 ENCOUNTER — Encounter: Payer: Self-pay | Admitting: Internal Medicine

## 2014-03-31 ENCOUNTER — Ambulatory Visit (INDEPENDENT_AMBULATORY_CARE_PROVIDER_SITE_OTHER): Payer: Commercial Managed Care - HMO | Admitting: Internal Medicine

## 2014-03-31 VITALS — BP 148/80 | HR 60 | Temp 97.5°F | Resp 10 | Ht 67.5 in | Wt 135.4 lb

## 2014-03-31 DIAGNOSIS — I10 Essential (primary) hypertension: Secondary | ICD-10-CM

## 2014-03-31 DIAGNOSIS — Z23 Encounter for immunization: Secondary | ICD-10-CM

## 2014-03-31 DIAGNOSIS — R21 Rash and other nonspecific skin eruption: Secondary | ICD-10-CM

## 2014-03-31 DIAGNOSIS — K5901 Slow transit constipation: Secondary | ICD-10-CM

## 2014-03-31 DIAGNOSIS — E7800 Pure hypercholesterolemia, unspecified: Secondary | ICD-10-CM

## 2014-03-31 DIAGNOSIS — R079 Chest pain, unspecified: Secondary | ICD-10-CM

## 2014-03-31 DIAGNOSIS — S21102S Unspecified open wound of left front wall of thorax without penetration into thoracic cavity, sequela: Secondary | ICD-10-CM

## 2014-03-31 DIAGNOSIS — Z0000 Encounter for general adult medical examination without abnormal findings: Secondary | ICD-10-CM

## 2014-03-31 MED ORDER — LISINOPRIL 5 MG PO TABS: 5.0000 mg | ORAL_TABLET | Freq: Every day | ORAL | Status: DC

## 2014-03-31 MED ORDER — SIMVASTATIN 20 MG PO TABS: 20.0000 mg | ORAL_TABLET | Freq: Every day | ORAL | Status: DC

## 2014-03-31 NOTE — Progress Notes (Signed)
Patient ID: Thomas Reyes, male   DOB: 08-May-1941, 73 y.o.   MRN: [id number]   Location:  [facility] [place] [facility]  Code Status: full code  Allergies  Allergen Reactions  . Lipitor [Atorvastatin]     Muscle and bone pain but tolerated pravachol    Chief Complaint  Patient presents with  . Annual Exam    Yearly check-up, EKG- UTD, no recent labs   . Chest Pain    Off/on patient with chest pain that last for a few seconds. Last episode x 2 months ago     HPI: Patient is a 73 y.o. white male seen in the office today for medical mgt of chronic diseases and his annual exam.    Chest pain:  Continues to have that lasts a few seconds.  Last visit, he said it was associated with belching, indigestion and flatus.  EKG was normal today with HR 56. Had previous negative stress test.    Was helping a friend put in [place].  Had a metal grinder fall on him on his left chest wall and he got sutures put in, got abx, healed and got his tetanus shot.  No longer has pain over left chest.  Also had sutures that dissolved put in on the inside.  Was very painful.    No new concerns otherwise today.  Everything seems pretty normal.  Takes bp at home and its 128-133/60s and HR around upper 50s.    Cholesterol got better after his last visit.    Bruises real easily.  Was cutting some wood pieces and got a big bruise on his left arm.    Review of Systems:  Review of Systems  Constitutional: Negative for fever and chills.  HENT: Positive for hearing loss. Negative for congestion.   Respiratory: Negative for shortness of breath.   Cardiovascular: Positive for chest pain. Negative for palpitations and leg swelling.       Associated with meals and indigestion and lasts just seconds  Gastrointestinal: Positive for heartburn. Negative for abdominal pain, constipation, blood in stool and melena.  Genitourinary: Negative for dysuria, urgency and frequency.  Musculoskeletal:  Negative for falls and myalgias.       See hpi re: left chest injury  Skin: Negative for itching and rash.       No need to fill prescription cream recently  Neurological: Negative for dizziness, loss of consciousness and headaches.  Endo/Heme/Allergies: Does not bruise/bleed easily.  Psychiatric/Behavioral: Negative for depression and memory loss.    Past Medical History  Diagnosis Date  . Hypercholesterolemia   . Edema of male genital organs 08/20/2012  . Chest pain, unspecified 08/20/2012  . Nocturia 08/20/2012  . 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 Chest pain, unspecified 12/26/2011  . Diverticulosis of colon (without mention of hemorrhage) 06/14/2010  . Elevated blood pressure reading without diagnosis of hypertension 06/14/2010  . Unspecified constipation 05/11/2009  . Urinary tract infection, site not specified 05/11/2009  . Lumbago 05/11/2009  . Carpal tunnel syndrome 03/16/2009  . Encounter for long-term (current) use of other medications   . 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 Corns and callosities 04/11/2007  . Pain in joint, ankle and foot 04/11/2007  . Ulcer of other part of foot 03/16/2007  . Allergic rhinitis, cause unspecified 09/08/2006  . Pterygium, unspecified 03/01/2006  . Other and unspecified hyperlipidemia 02/24/2006  . COPD (chronic obstructive pulmonary disease) 02/24/2006  .  Hypertrophy of prostate without urinary obstruction and other lower urinary tract symptoms (LUTS)   . Dermatophytosis of the body   . Tobacco use disorder   . Disturbance of skin sensation   . Ventral hernia, unspecified, without mention of obstruction or gangrene   . Seborrheic dermatitis, unspecified 03/21/2001  . Rash and other nonspecific skin eruption 12/28/1999  . Concussion, unspecified 10/30/1994    Past Surgical History  Procedure Laterality Date  . Elbow surgery Right   . Knee surgery Right 01/2007    knee cap  injury  . Tonsillectomy  1948  . Fracture surgery  1986    right hip, right elbow    Social History:   reports that he quit smoking about 3 years ago. He does not have any smokeless tobacco history on file. He reports that he does not drink alcohol or use illicit drugs.  Family History  Problem Relation Age of Onset  . Diabetes Mother   . Hypertension Mother   . Cancer Father     Medications: Patient's Medications  New Prescriptions   No medications on file  Previous Medications   ASPIRIN 81 MG TABLET    Take 81 mg by mouth daily. Take one tablet once a day   LISINOPRIL (PRINIVIL,ZESTRIL) 5 MG TABLET    Take 1 tablet (5 mg total) by mouth daily.   SIMVASTATIN (ZOCOR) 20 MG TABLET    Take 1 tablet (20 mg total) by mouth at bedtime.  Modified Medications   No medications on file  Discontinued Medications   CEPHALEXIN (KEFLEX) 500 MG CAPSULE    Take 1 capsule (500 mg total) by mouth 3 (three) times daily.   TRAMADOL (ULTRAM) 50 MG TABLET    Take 1 tablet (50 mg total) by mouth every 6 (six) hours as needed.     Physical Exam: Filed Vitals:   03/31/14 1344  BP: 148/80  Pulse: 60  Temp: 97.5 F (36.4 C)  TempSrc: Oral  Resp: 10  Height: 5' 7.5" (1.715 m)  Weight: 135 lb 6.4 oz (61.417 kg)  SpO2: 97%  Physical Exam  Constitutional: He is oriented to person, place, and time. He appears well-developed and well-nourished. No distress.  HENT:  Head: Normocephalic and atraumatic.  Right Ear: External ear normal.  Left Ear: External ear normal.  Nose: Nose normal.  Mouth/Throat: Oropharynx is clear and moist.  Eyes: Conjunctivae and EOM are normal. Pupils are equal, round, and reactive to light.  Neck: Normal range of motion. Neck supple. No JVD present. No tracheal deviation present. No thyromegaly present.  Cardiovascular: Normal rate, regular rhythm, normal heart sounds and intact distal pulses.   Pulmonary/Chest: Effort normal and breath sounds normal. No respiratory  distress.  Abdominal: Soft. Bowel sounds are normal. He exhibits no distension and no mass. There is no tenderness.  Musculoskeletal: Normal range of motion. He exhibits no edema and no tenderness.  Neurological: He is alert and oriented to person, place, and time. He has normal reflexes.  Skin: Skin is warm and dry.  Rhinophyma;  Has scar of left breast area   Psychiatric: He has a normal mood and affect. His behavior is normal. Judgment and thought content normal.    Labs reviewed: Liver Function Tests:  Recent Labs  07/16/13 0812 11/25/13 0807  AST 23 15  ALT 14 9  ALKPHOS 59 54  BILITOT 0.6 0.3  PROT 6.6 6.4  CBC:  Recent Labs  07/16/13 0812  WBC 5.9  NEUTROABS 3.8  HGB 13.5  HCT 41.5  MCV 80   Lipid Panel:  Recent Labs  07/16/13 0812 11/25/13 0807  HDL 56 57  LDLCALC 106* 90  TRIG 70 60  CHOLHDL 3.1 2.8   Assessment/Plan 1. Chest pain, unspecified chest pain type - seems to be GI -had negative stress test previously and does not seem cardiac  - EKG 12-Lead was sinus, slightly bradycardic - Hemoglobin A1c; Future - Lipid panel; Future - Comprehensive metabolic panel; Future - CBC With differential/Platelet; Future  2. Essential hypertension, benign -bp at goal - lisinopril (PRINIVIL,ZESTRIL) 5 MG tablet; Take 1 tablet (5 mg total) by mouth daily.  Dispense: 90 tablet; Refill: 3  3. Hypercholesterolemia -cont current zocor as at goal in 2/15, recheck before next time - simvastatin (ZOCOR) 20 MG tablet; Take 1 tablet (20 mg total) by mouth at bedtime.  Dispense: 90 tablet; Refill: 3 - Lipid panel; Future  4. Rash of groin -no recent symptoms  5. Constipation, slow transit -no problems lately, has not required meds  6. Chest wall soft tissue injury, left, sequela - has healed and had sutures removed at ED -had tdap at ED - CBC With differential/Platelet; Future  7. Routine general medical examination at a health care facility -here for his  annual exam -no depression, falls, memory is good -given flu shot today -had prevnar 07/18/13 and will need 23 valent next year -had tdap when had trauma to left chest with metal grinder -had cscope last year   Labs/tests ordered:   Orders Placed This Encounter  Procedures  . Hemoglobin A1c    Standing Status: Future     Number of Occurrences:      Standing Expiration Date: 04/01/2015  . Lipid panel    Standing Status: Future     Number of Occurrences:      Standing Expiration Date: 04/01/2015    Order Specific Question:  Has the patient fasted?    Answer:  Yes  . Comprehensive metabolic panel    Standing Status: Future     Number of Occurrences:      Standing Expiration Date: 04/01/2015    Order Specific Question:  Has the patient fasted?    Answer:  Yes  . CBC With differential/Platelet    Standing Status: Future     Number of Occurrences:      Standing Expiration Date: 04/01/2015  . EKG 12-Lead    Next appt:  6 mos with labs before  Caysen Whang L. Raha Tennison, D.O. [facility] [address], [place] [address] Cell Phone (Mon-Fri 8am-5pm):  [phone] On Call:  [phone] & follow prompts after 5pm & weekends Office Phone:  [phone] Office Fax:  [phone]

## 2014-03-31 NOTE — Patient Instructions (Signed)
Please bring [place] a copy of your living will/health care power of attorney paperwork to put into your chart.

## 2014-06-30 ENCOUNTER — Ambulatory Visit (INDEPENDENT_AMBULATORY_CARE_PROVIDER_SITE_OTHER): Payer: Commercial Managed Care - HMO | Admitting: Internal Medicine

## 2014-06-30 ENCOUNTER — Encounter: Payer: Self-pay | Admitting: Internal Medicine

## 2014-06-30 VITALS — BP 140/82 | HR 56 | Temp 97.4°F | Resp 10 | Ht 67.0 in | Wt 135.0 lb

## 2014-06-30 DIAGNOSIS — M170 Bilateral primary osteoarthritis of knee: Secondary | ICD-10-CM

## 2014-06-30 DIAGNOSIS — L570 Actinic keratosis: Secondary | ICD-10-CM

## 2014-06-30 NOTE — Patient Instructions (Signed)
Tylenol arthritis 1 tablet three times daily.

## 2014-06-30 NOTE — Progress Notes (Signed)
Patient ID: Thomas Reyes, male   DOB: 02-14-1941, 74 y.o.   MRN: [id number]   Location:  [facility] [place] [facility]  Code Status: full code  Allergies  Allergen Reactions  . Lipitor [Atorvastatin]     Muscle and bone pain but tolerated pravachol    Chief Complaint  Patient presents with  . Referral    Patient needs a referral to dermatologist per insurance. Patient with scab on head   . Knee Problem    Bilateral knee pain x 2 months (left knee is worse)     HPI: Patient is a 74 y.o.  seen in the office today for acute visit b/c he needs another derm referral in the new year.  He has a new scab on his head.  On right parietal area.  Previously had skin cancer on left forearm cut off and another on scalp that was frozen off.  That was on [facility].  He also has increased bilateral knee pain for two months worse on the left.  Had knee surgery 20-30 years ago.  Hurts if he stands back up after being down on his knees.  Sometimes hurt just to be hurting.   Previously had arthroscopy.  Does not recall physician's name.  Was behind [place] at [facility] and [employer]  Hasn't really tried otc agents like tylenol.    Review of Systems:  Review of Systems  Musculoskeletal: Positive for joint pain.  Skin:       See hpi    Past Medical History  Diagnosis Date  . Hypercholesterolemia   . Edema of male genital organs 08/20/2012  . Chest pain, unspecified 08/20/2012  . Nocturia 08/20/2012  . 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 Chest pain, unspecified 12/26/2011  . Diverticulosis of colon (without mention of hemorrhage) 06/14/2010  . Elevated blood pressure reading without diagnosis of hypertension 06/14/2010  . Unspecified constipation 05/11/2009  . Urinary tract infection, site not specified 05/11/2009  . Lumbago 05/11/2009  . Carpal tunnel syndrome 03/16/2009  . Encounter for long-term (current)  use of other medications   . 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 Corns and callosities 04/11/2007  . Pain in joint, ankle and foot 04/11/2007  . Ulcer of other part of foot 03/16/2007  . Allergic rhinitis, cause unspecified 09/08/2006  . Pterygium, unspecified 03/01/2006  . Other and unspecified hyperlipidemia 02/24/2006  . COPD (chronic obstructive pulmonary disease) 02/24/2006  . Hypertrophy of prostate without urinary obstruction and other lower urinary tract symptoms (LUTS)   . Dermatophytosis of the body   . Tobacco use disorder   . Disturbance of skin sensation   . Ventral hernia, unspecified, without mention of obstruction or gangrene   . Seborrheic dermatitis, unspecified 03/21/2001  . Rash and other nonspecific skin eruption 12/28/1999  . Concussion, unspecified 10/30/1994    Past Surgical History  Procedure Laterality Date  . Elbow surgery Right   . Knee surgery Right 01/2007    knee cap injury  . Tonsillectomy  1948  . Fracture surgery  1986    right hip, right elbow    Social History:   reports that he quit smoking about 4 years ago. He does not have any smokeless tobacco history on file. He reports that he does not drink alcohol or use illicit drugs.  Family History  Problem Relation Age of Onset  . Diabetes Mother   . Hypertension Mother   .  Cancer Father     Medications: Patient's Medications  New Prescriptions   No medications on file  Previous Medications   ASPIRIN 81 MG TABLET    Take 81 mg by mouth daily. Take one tablet once a day   LISINOPRIL (PRINIVIL,ZESTRIL) 5 MG TABLET    Take 1 tablet (5 mg total) by mouth daily.   SIMVASTATIN (ZOCOR) 20 MG TABLET    Take 1 tablet (20 mg total) by mouth at bedtime.  Modified Medications   No medications on file  Discontinued Medications   No medications on file     Physical Exam: Filed Vitals:   06/30/14 1509  BP: 140/82  Pulse: 56  Temp: 97.4 F (36.3 C)  TempSrc: Oral  Resp: 10  Height:  5\' 7"  (1.702 m)  Weight: 135 lb (61.236 kg)  SpO2: 96%  Physical Exam  Constitutional: No distress.  Musculoskeletal: He exhibits tenderness.  Of left medial knee, both have normal ROM  Neurological: Coordination normal.  Skin:  Pencil eraser sized actinic keratosis on right parietal scalp area  Psychiatric: He has a normal mood and affect.    Labs reviewed: Liver Function Tests:  Recent Labs  07/16/13 0812 11/25/13 0807  AST 23 15  ALT 14 9  ALKPHOS 59 54  BILITOT 0.6 0.3  PROT 6.6 6.4  CBC:  Recent Labs  07/16/13 0812  WBC 5.9  NEUTROABS 3.8  HGB 13.5  HCT 41.5  MCV 80   Lipid Panel:  Recent Labs  07/16/13 0812 11/25/13 0807  HDL 56 57  LDLCALC 106* 90  TRIG 70 60  CHOLHDL 3.1 2.8   Assessment/Plan 1. Primary osteoarthritis of both knees - advised to use tylenol for pain in instructions -xrays ordered: - DG Knee Complete 4 Views Left; Future - DG Knee Complete 4 Views Right; Future -may do steroid injection in knee if only oa is seen -if that's not helpful or pain worsens, will refer to ortho  2. Actinic keratosis of scalp -previously was seen on [address]--had left arm skin cancer excised and area on scalp frozen - Ambulatory referral to Dermatology  Labs/tests ordered:   Orders Placed This Encounter  Procedures  . DG Knee Complete 4 Views Left    Standing Status: Future     Number of Occurrences:      Standing Expiration Date: 08/29/2015    Order Specific Question:  Reason for Exam (SYMPTOM  OR DIAGNOSIS REQUIRED)    Answer:  bilateral knee pain, prior arthroscopy    Order Specific Question:  Preferred imaging location?    Answer:  [facility]  . DG Knee Complete 4 Views Right    Standing Status: Future     Number of Occurrences:      Standing Expiration Date: 08/29/2015    Order Specific Question:  Reason for Exam (SYMPTOM  OR DIAGNOSIS REQUIRED)    Answer:  bilateral knee pain with h/o arthroscopy    Order Specific Question:   Preferred imaging location?    Answer:  [facility]  . Ambulatory referral to Dermatology    Referral Priority:  Routine    Referral Type:  Consultation    Referral Reason:  Specialty Services Required    Requested Specialty:  Dermatology    Number of Visits Requested:  1    Next appt:  Keep in april  Geneve Kimpel L. Nupur Hohman, D.O. [facility] [address], [place] [address] Cell Phone (Mon-Fri  8am-5pm):  [phone] On Call:  [phone] & follow prompts after 5pm & weekends Office Phone:  [phone] Office Fax:  [phone]

## 2014-07-01 ENCOUNTER — Ambulatory Visit
Admission: RE | Admit: 2014-07-01 | Discharge: 2014-07-01 | Disposition: A | Discharge status: Home / Self Care | Payer: Commercial Managed Care - HMO | Source: Ambulatory Visit | Attending: Internal Medicine | Admitting: Internal Medicine

## 2014-07-01 DIAGNOSIS — M25562 Pain in left knee: Secondary | ICD-10-CM | POA: Diagnosis not present

## 2014-07-01 DIAGNOSIS — M170 Bilateral primary osteoarthritis of knee: Secondary | ICD-10-CM

## 2014-07-01 DIAGNOSIS — M25561 Pain in right knee: Secondary | ICD-10-CM | POA: Diagnosis not present

## 2014-07-07 HISTORY — PX: SKIN CANCER EXCISION: SHX779

## 2014-07-18 DIAGNOSIS — D225 Melanocytic nevi of trunk: Secondary | ICD-10-CM | POA: Diagnosis not present

## 2014-07-18 DIAGNOSIS — D485 Neoplasm of uncertain behavior of skin: Secondary | ICD-10-CM | POA: Diagnosis not present

## 2014-07-18 DIAGNOSIS — L570 Actinic keratosis: Secondary | ICD-10-CM | POA: Diagnosis not present

## 2014-08-05 ENCOUNTER — Other Ambulatory Visit: Payer: Self-pay | Admitting: Internal Medicine

## 2014-09-30 ENCOUNTER — Other Ambulatory Visit: Payer: Commercial Managed Care - HMO

## 2014-09-30 DIAGNOSIS — E7800 Pure hypercholesterolemia, unspecified: Secondary | ICD-10-CM

## 2014-09-30 DIAGNOSIS — R079 Chest pain, unspecified: Secondary | ICD-10-CM | POA: Diagnosis not present

## 2014-09-30 DIAGNOSIS — S21102S Unspecified open wound of left front wall of thorax without penetration into thoracic cavity, sequela: Secondary | ICD-10-CM

## 2014-10-01 LAB — HEMOGLOBIN A1C
Est. average glucose Bld gHb Est-mCnc: 128 mg/dL
Hgb A1c MFr Bld: 6.1 % — ABNORMAL HIGH (ref 4.8–5.6)

## 2014-10-01 LAB — CBC WITH DIFFERENTIAL
Basophils Absolute: 0 10*3/uL (ref 0.0–0.2)
Basos: 0 %
EOS (ABSOLUTE): 0.3 10*3/uL (ref 0.0–0.4)
Eos: 4 %
Hematocrit: 39.9 % (ref 37.5–51.0)
Hemoglobin: 12.7 g/dL (ref 12.6–17.7)
Immature Grans (Abs): 0 10*3/uL (ref 0.0–0.1)
Immature Granulocytes: 0 %
Lymphocytes Absolute: 1.6 10*3/uL (ref 0.7–3.1)
Lymphs: 25 %
MCH: 26.3 pg — ABNORMAL LOW (ref 26.6–33.0)
MCHC: 31.8 g/dL (ref 31.5–35.7)
MCV: 83 fL (ref 79–97)
Monocytes Absolute: 0.4 10*3/uL (ref 0.1–0.9)
Monocytes: 6 %
Neutrophils Absolute: 4.2 10*3/uL (ref 1.4–7.0)
Neutrophils: 65 %
RBC: 4.82 x10E6/uL (ref 4.14–5.80)
RDW: 13.9 % (ref 12.3–15.4)
WBC: 6.5 10*3/uL (ref 3.4–10.8)

## 2014-10-01 LAB — COMPREHENSIVE METABOLIC PANEL
ALT: 6 IU/L (ref 0–44)
AST: 16 IU/L (ref 0–40)
Albumin/Globulin Ratio: 1.5 (ref 1.1–2.5)
Albumin: 3.8 g/dL (ref 3.5–4.8)
Alkaline Phosphatase: 54 IU/L (ref 39–117)
BUN/Creatinine Ratio: 9 — ABNORMAL LOW (ref 10–22)
BUN: 11 mg/dL (ref 8–27)
Bilirubin Total: 0.4 mg/dL (ref 0.0–1.2)
CO2: 25 mmol/L (ref 18–29)
Calcium: 8.8 mg/dL (ref 8.6–10.2)
Chloride: 102 mmol/L (ref 97–108)
Creatinine, Ser: 1.29 mg/dL — ABNORMAL HIGH (ref 0.76–1.27)
GFR calc Af Amer: 63 mL/min/{1.73_m2} (ref >59)
GFR calc non Af Amer: 54 mL/min/{1.73_m2} — ABNORMAL LOW (ref >59)
Globulin, Total: 2.5 g/dL (ref 1.5–4.5)
Glucose: 94 mg/dL (ref 65–99)
Potassium: 4.6 mmol/L (ref 3.5–5.2)
Sodium: 139 mmol/L (ref 134–144)
Total Protein: 6.3 g/dL (ref 6.0–8.5)

## 2014-10-01 LAB — LIPID PANEL
Chol/HDL Ratio: 4.1 ratio units (ref 0.0–5.0)
Cholesterol, Total: 222 mg/dL — ABNORMAL HIGH (ref 100–199)
HDL: 54 mg/dL (ref >39)
LDL Calculated: 154 mg/dL — ABNORMAL HIGH (ref 0–99)
Triglycerides: 72 mg/dL (ref 0–149)
VLDL Cholesterol Cal: 14 mg/dL (ref 5–40)

## 2014-10-02 ENCOUNTER — Encounter: Payer: Self-pay | Admitting: Internal Medicine

## 2014-10-02 ENCOUNTER — Ambulatory Visit (INDEPENDENT_AMBULATORY_CARE_PROVIDER_SITE_OTHER): Payer: Commercial Managed Care - HMO | Admitting: Internal Medicine

## 2014-10-02 VITALS — BP 150/82 | HR 62 | Temp 97.5°F | Ht 67.0 in | Wt 136.0 lb

## 2014-10-02 DIAGNOSIS — G720 Drug-induced myopathy: Secondary | ICD-10-CM | POA: Diagnosis not present

## 2014-10-02 DIAGNOSIS — L570 Actinic keratosis: Secondary | ICD-10-CM | POA: Diagnosis not present

## 2014-10-02 DIAGNOSIS — K5901 Slow transit constipation: Secondary | ICD-10-CM | POA: Diagnosis not present

## 2014-10-02 DIAGNOSIS — Z23 Encounter for immunization: Secondary | ICD-10-CM | POA: Diagnosis not present

## 2014-10-02 DIAGNOSIS — T466X5A Adverse effect of antihyperlipidemic and antiarteriosclerotic drugs, initial encounter: Secondary | ICD-10-CM

## 2014-10-02 DIAGNOSIS — I10 Essential (primary) hypertension: Secondary | ICD-10-CM | POA: Diagnosis not present

## 2014-10-02 DIAGNOSIS — E785 Hyperlipidemia, unspecified: Secondary | ICD-10-CM

## 2014-10-02 MED ORDER — EZETIMIBE 10 MG PO TABS: 10.0000 mg | ORAL_TABLET | Freq: Every day | ORAL | Status: DC

## 2014-10-02 NOTE — Patient Instructions (Signed)
Please check your blood pressure at home one hour after you've taken your medication and call us with the restuls after one week.

## 2014-10-02 NOTE — Progress Notes (Signed)
Patient ID: Thomas Reyes, male   DOB: 12/16/1940, 74 y.o.   MRN: [id number]   Location:  [facility] [place] [facility]  Code Status: full code Goals of Care: Advanced Directive information Does patient have an advance directive?: No, Would patient like information on creating an advanced directive?: No - patient declined information     Allergies  Allergen Reactions  . Simvastatin Shortness Of Breath    Sob and itching  . Lipitor [Atorvastatin]     Muscle and bone pain but tolerated pravachol    Chief Complaint  Patient presents with  . Medical Management of Chronic Issues    6 month follow-up, discuss labs (copy printed)  . Immunizations    Pneu 23 due     HPI: Patient is a 74 y.o. white male here for med mgt.    Stopped his zocor one month ago due to aches, pains, itching--broke out on his back.  Quit taking it and all of it went away.    Appears he actually had shingles on his left side of his low back.    BP high again today.  Has not been checking at home.  Had taken had home for a while.  Has some white coat syndrome.  Had been running 130s at home.    Went to skin cancer doctor--had removed from back and head--goes back there on 5/20.    No chest pressure.    Got hearing aides and has to still get adjusted one more time.    Got pneumovax today.   Review of Systems:  Review of Systems  Constitutional: Negative for fever and chills.  HENT: Negative for congestion.   Eyes: Negative for blurred vision.  Respiratory: Negative for shortness of breath.   Cardiovascular: Negative for chest pain.  Gastrointestinal: Positive for constipation. Negative for abdominal pain, blood in stool and melena.  Genitourinary: Negative for dysuria.  Musculoskeletal: Positive for myalgias and back pain. Negative for falls.  Skin: Negative for rash.  Neurological: Negative for dizziness.  Psychiatric/Behavioral: Negative for depression.    Past Medical  History  Diagnosis Date  . Hypercholesterolemia   . Edema of male genital organs 08/20/2012  . Chest pain, unspecified 08/20/2012  . Nocturia 08/20/2012  . 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 Chest pain, unspecified 12/26/2011  . Diverticulosis of colon (without mention of hemorrhage) 06/14/2010  . Elevated blood pressure reading without diagnosis of hypertension 06/14/2010  . Unspecified constipation 05/11/2009  . Urinary tract infection, site not specified 05/11/2009  . Lumbago 05/11/2009  . Carpal tunnel syndrome 03/16/2009  . Encounter for long-term (current) use of other medications   . 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 Corns and callosities 04/11/2007  . Pain in joint, ankle and foot 04/11/2007  . Ulcer of other part of foot 03/16/2007  . Allergic rhinitis, cause unspecified 09/08/2006  . Pterygium, unspecified 03/01/2006  . Other and unspecified hyperlipidemia 02/24/2006  . COPD (chronic obstructive pulmonary disease) 02/24/2006  . Hypertrophy of prostate without urinary obstruction and other lower urinary tract symptoms (LUTS)   . Dermatophytosis of the body   . Tobacco use disorder   . Disturbance of skin sensation   . Ventral hernia, unspecified, without mention of obstruction or gangrene   . Seborrheic dermatitis, unspecified 03/21/2001  . Rash and other nonspecific skin eruption 12/28/1999  . Concussion, unspecified 10/30/1994    Past Surgical History  Procedure Laterality  Date  . Elbow surgery Right   . Knee surgery Right 01/2007    knee cap injury  . Tonsillectomy  1948  . Fracture surgery  1986    right hip, right elbow  . Skin cancer excision  07/2014    Back and head     Social History:   reports that he quit smoking about 4 years ago. He does not have any smokeless tobacco history on file. He reports that he does not drink alcohol or use illicit drugs.  Family History  Problem Relation Age of  Onset  . Diabetes Mother   . Hypertension Mother   . Cancer Father     Medications: Patient's Medications  New Prescriptions   No medications on file  Previous Medications   ASPIRIN 81 MG TABLET    Take 81 mg by mouth daily. Take one tablet once a day   LISINOPRIL (PRINIVIL,ZESTRIL) 5 MG TABLET    TAKE 1 TABLET EVERY DAY TO CONTROL BLOOD PRESSURE  Modified Medications   No medications on file  Discontinued Medications   SIMVASTATIN (ZOCOR) 20 MG TABLET    TAKE 1 TABLET AT BEDTIME  FOR  CHOLESTEROL     Physical Exam: Filed Vitals:   10/02/14 0840  BP: 150/82  Pulse: 62  Temp: 97.5 F (36.4 C)  TempSrc: Oral  Height: 5\' 7"  (1.702 m)  Weight: 136 lb (61.689 kg)  SpO2: 95%  Physical Exam  Constitutional: No distress.  Thin white male  Cardiovascular: Normal rate, regular rhythm, normal heart sounds and intact distal pulses.   Pulmonary/Chest: Effort normal and breath sounds normal.  Abdominal: Soft. Bowel sounds are normal.  Musculoskeletal: Normal range of motion. He exhibits no edema or tenderness.  Neurological:  Bells palsy left  Skin: Skin is warm and dry.  Rhinophyma  Psychiatric: He has a normal mood and affect.     Labs reviewed: Basic Metabolic Panel:  Recent Labs  09/30/14 0817  NA 139  K 4.6  CL 102  CO2 25  GLUCOSE 94  BUN 11  CREATININE 1.29*  CALCIUM 8.8   Liver Function Tests:  Recent Labs  11/25/13 0807 09/30/14 0817  AST 15 16  ALT 9 6  ALKPHOS 54 54  BILITOT 0.3 0.4  PROT 6.4 6.3   No results for input(s): LIPASE, AMYLASE in the last 8760 hours. No results for input(s): AMMONIA in the last 8760 hours. CBC:  Recent Labs  09/30/14 0817  WBC 6.5  NEUTROABS 4.2  HCT 39.9   Lipid Panel:  Recent Labs  11/25/13 0807 09/30/14 0817  CHOL 159 222*  HDL 57 54  LDLCALC 90 154*  TRIG 60 72  CHOLHDL 2.8 4.1   Lab Results  Component Value Date   HGBA1C 6.1* 09/30/2014    Assessment/Plan 1. Hyperlipidemia - cannot  take statins--prescribed zetia instead -Comprehensive metabolic panel; Future - Hemoglobin A1c; Future - Lipid panel; Future - ezetimibe (ZETIA) 10 MG tablet; Take 1 tablet (10 mg total) by mouth daily.  Dispense: 30 tablet; Refill: 3  2. Statin myopathy - will not prescribe any more statins for him -has tried zocor, lipitor and pravachol with myopathy after all and pravachol was ineffective at lowering his levels - Comprehensive metabolic panel; Future  3. Actinic keratosis of scalp -had skin cancers removed and follows up in May with derm - CBC with Differential/Platelet; Future  4. Essential hypertension, benign -bp at goal at home, he says, but has not actually checked lately -elevated  here the last 5 visits so advised to check daily at home and bring or call with results after one week - Comprehensive metabolic panel; Future  5. Constipation, slow transit -bowels are now moving daily to every other day w/o difficulty  6. Need for 23-polyvalent pneumococcal polysaccharide vaccine - Pneumococcal polysaccharide vaccine 23-valent greater than or equal to 2yo subcutaneous/IM--pneumovax given  Labs/tests ordered:   Orders Placed This Encounter  Procedures  . Pneumococcal polysaccharide vaccine 23-valent greater than or equal to 2yo subcutaneous/IM  . CBC with Differential/Platelet    Standing Status: Future     Number of Occurrences:      Standing Expiration Date: 04/03/2015  . Comprehensive metabolic panel    Standing Status: Future     Number of Occurrences:      Standing Expiration Date: 04/03/2015    Order Specific Question:  Has the patient fasted?    Answer:  Yes  . Hemoglobin A1c    Standing Status: Future     Number of Occurrences:      Standing Expiration Date: 04/03/2015  . Lipid panel    Standing Status: Future     Number of Occurrences:      Standing Expiration Date: 04/03/2015    Order Specific Question:  Has the patient fasted?    Answer:  Yes   Next  appt:  3 mos with labs before  Geoffrey Mankin L. Kashara Blocher, D.O. [facility] [address], [place] [address] Cell Phone (Mon-Fri 8am-5pm):  [phone] On Call:  [phone] & follow prompts after 5pm & weekends Office Phone:  [phone] Office Fax:  [phone]

## 2014-10-13 ENCOUNTER — Telehealth: Payer: Self-pay | Admitting: *Deleted

## 2014-10-13 NOTE — Telephone Encounter (Signed)
Have to pay $131.00 for Zetia and cannot afford this,  wants it changed to something that is Generic. Please Advise.

## 2014-10-13 NOTE — Telephone Encounter (Signed)
Patient walked in and left note stating that Zocor cost too much and wants something else called into [employer].  I checked patient's chart and it stated in the last OV note that patient could not tolerate statins and Zetia was prescribed instead. I tried calling patient [phone] and LMOM to return call to confirm which medication.

## 2014-10-13 NOTE — Telephone Encounter (Signed)
There are no other alternatives.  Can we give him one of those general discount cards we have around?  Maybe that will help.

## 2014-10-13 NOTE — Telephone Encounter (Signed)
Patient stated he doesn't want to take it anyway. Dr. Mariea Clonts Notified.

## 2014-10-14 ENCOUNTER — Telehealth: Payer: Self-pay | Admitting: *Deleted

## 2014-10-14 NOTE — Telephone Encounter (Signed)
Received patients BP Readings  10/04/14-  147/74  Pulse  55 10/05/14-    142/68             57 10/06/14-     130/65            66  10/07/14-     118/60            63  10/08/14-     137/68            58 10/09/14-     132/65            55 10/10/14-     134/68            54 10/11/14-     133/72            61   Per Dr. Doristine Devoid are improved. Please let him Know. Patient Notified.

## 2014-10-24 DIAGNOSIS — L905 Scar conditions and fibrosis of skin: Secondary | ICD-10-CM | POA: Diagnosis not present

## 2014-10-24 DIAGNOSIS — L570 Actinic keratosis: Secondary | ICD-10-CM | POA: Diagnosis not present

## 2014-12-24 ENCOUNTER — Other Ambulatory Visit: Payer: Commercial Managed Care - HMO

## 2014-12-24 DIAGNOSIS — L570 Actinic keratosis: Secondary | ICD-10-CM

## 2014-12-24 DIAGNOSIS — T466X5A Adverse effect of antihyperlipidemic and antiarteriosclerotic drugs, initial encounter: Secondary | ICD-10-CM

## 2014-12-24 DIAGNOSIS — E785 Hyperlipidemia, unspecified: Secondary | ICD-10-CM | POA: Diagnosis not present

## 2014-12-24 DIAGNOSIS — I10 Essential (primary) hypertension: Secondary | ICD-10-CM

## 2014-12-24 DIAGNOSIS — G720 Drug-induced myopathy: Secondary | ICD-10-CM | POA: Diagnosis not present

## 2014-12-25 LAB — CBC WITH DIFFERENTIAL/PLATELET
Basophils Absolute: 0 10*3/uL (ref 0.0–0.2)
Basos: 0 %
EOS (ABSOLUTE): 0.2 10*3/uL (ref 0.0–0.4)
Eos: 4 %
Hematocrit: 41.7 % (ref 37.5–51.0)
Hemoglobin: 14 g/dL (ref 12.6–17.7)
Immature Grans (Abs): 0 10*3/uL (ref 0.0–0.1)
Immature Granulocytes: 0 %
Lymphocytes Absolute: 1.5 10*3/uL (ref 0.7–3.1)
Lymphs: 27 %
MCH: 28 pg (ref 26.6–33.0)
MCHC: 33.6 g/dL (ref 31.5–35.7)
MCV: 83 fL (ref 79–97)
Monocytes Absolute: 0.4 10*3/uL (ref 0.1–0.9)
Monocytes: 7 %
Neutrophils Absolute: 3.4 10*3/uL (ref 1.4–7.0)
Neutrophils: 62 %
Platelets: 237 10*3/uL (ref 150–379)
RBC: 5 x10E6/uL (ref 4.14–5.80)
RDW: 17 % — ABNORMAL HIGH (ref 12.3–15.4)
WBC: 5.4 10*3/uL (ref 3.4–10.8)

## 2014-12-25 LAB — COMPREHENSIVE METABOLIC PANEL
ALT: 7 IU/L (ref 0–44)
AST: 13 IU/L (ref 0–40)
Albumin/Globulin Ratio: 1.6 (ref 1.1–2.5)
Albumin: 4 g/dL (ref 3.5–4.8)
Alkaline Phosphatase: 57 IU/L (ref 39–117)
BUN/Creatinine Ratio: 12 (ref 10–22)
BUN: 14 mg/dL (ref 8–27)
Bilirubin Total: 0.3 mg/dL (ref 0.0–1.2)
CO2: 24 mmol/L (ref 18–29)
Calcium: 8.9 mg/dL (ref 8.6–10.2)
Chloride: 98 mmol/L (ref 97–108)
Creatinine, Ser: 1.19 mg/dL (ref 0.76–1.27)
GFR calc Af Amer: 69 mL/min/{1.73_m2} (ref >59)
GFR calc non Af Amer: 60 mL/min/{1.73_m2} (ref >59)
Globulin, Total: 2.5 g/dL (ref 1.5–4.5)
Glucose: 86 mg/dL (ref 65–99)
Potassium: 4.9 mmol/L (ref 3.5–5.2)
Sodium: 136 mmol/L (ref 134–144)
Total Protein: 6.5 g/dL (ref 6.0–8.5)

## 2014-12-25 LAB — LIPID PANEL
Chol/HDL Ratio: 5 ratio units (ref 0.0–5.0)
Cholesterol, Total: 259 mg/dL — ABNORMAL HIGH (ref 100–199)
HDL: 52 mg/dL (ref >39)
LDL Calculated: 192 mg/dL — ABNORMAL HIGH (ref 0–99)
Triglycerides: 74 mg/dL (ref 0–149)
VLDL Cholesterol Cal: 15 mg/dL (ref 5–40)

## 2014-12-25 LAB — HEMOGLOBIN A1C
Est. average glucose Bld gHb Est-mCnc: 126 mg/dL
Hgb A1c MFr Bld: 6 % — ABNORMAL HIGH (ref 4.8–5.6)

## 2014-12-26 ENCOUNTER — Encounter: Payer: Self-pay | Admitting: Internal Medicine

## 2014-12-26 ENCOUNTER — Ambulatory Visit (INDEPENDENT_AMBULATORY_CARE_PROVIDER_SITE_OTHER): Payer: Commercial Managed Care - HMO | Admitting: Internal Medicine

## 2014-12-26 VITALS — BP 130/70 | HR 53 | Temp 97.4°F | Resp 20 | Ht 67.0 in | Wt 134.2 lb

## 2014-12-26 DIAGNOSIS — H9193 Unspecified hearing loss, bilateral: Secondary | ICD-10-CM | POA: Diagnosis not present

## 2014-12-26 DIAGNOSIS — Z87828 Personal history of other (healed) physical injury and trauma: Secondary | ICD-10-CM

## 2014-12-26 DIAGNOSIS — R21 Rash and other nonspecific skin eruption: Secondary | ICD-10-CM | POA: Diagnosis not present

## 2014-12-26 DIAGNOSIS — L209 Atopic dermatitis, unspecified: Secondary | ICD-10-CM

## 2014-12-26 DIAGNOSIS — E7800 Pure hypercholesterolemia, unspecified: Secondary | ICD-10-CM

## 2014-12-26 DIAGNOSIS — I10 Essential (primary) hypertension: Secondary | ICD-10-CM

## 2014-12-26 DIAGNOSIS — K5901 Slow transit constipation: Secondary | ICD-10-CM | POA: Diagnosis not present

## 2014-12-26 DIAGNOSIS — H9190 Unspecified hearing loss, unspecified ear: Secondary | ICD-10-CM | POA: Insufficient documentation

## 2014-12-26 NOTE — Progress Notes (Signed)
Patient ID: Thomas Reyes, male   DOB: 1941-03-10, 74 y.o.   MRN: [id number]   Location:  [facility] [place] [facility]  Code Status: Full code Goals of Care: Advanced Directive information Encounter buttons clicked in [place] pt asked about advance directive, he said, no, but when I showed him what it was, he said he does have a living will and hcpoa in his will--requested he bring a copy to [place]  Chief Complaint  Patient presents with  . Medical Management of Chronic Issues    3 month follow-up,labs printed    HPI: Patient is a 74 y.o. white male seen in the office today for med mgt of chronic diseases.  Has a place on his left lower back for about six months that itches a lot and feels to him like dry skin.  Hasn't put anything on it.  Dermatologist already addressed--said he could have a lotion for it, but he "didn't get none."  Was in Feb--had dysplastic nevus biopsied on right mid back and pt reports pathology was negative for malignancy.  Also had lesion on head and wrist removed.  Needs ophtho referral to go back to Dr. Vivi Ferns an appointment next week.    HTN:  130/70 today.  He was shocked.  Was 125/70 at home last week.  DIscussed decreasing sodium in the diet to keep bp down.  Is stable on lisinopril.  Hearing loss:  Got new hearing aides.  Got adjusted several times.  Feels like he still needs another adjustment in 3 mos--much better than they used to be, but I still had to repeat myself several times.  Has gotten accustomed to lip reading.  Zostavax:  His part was still going to be $200 per his insurance.    He does not have urinary incontinence.  Hyperlipidemia:  He did not tolerate the statins due to myopathy, but zetia was too expensive.  LDL continued to go up.  I had suggested a discount card for him, but he never picked one up.  LDL now 192.    Constipation:  Says he does not have this since he's been off of cholesterol  medication.  Is adamantly opposed to taking cholesterol meds of any type.   Review of Systems:  Review of Systems  Constitutional: Negative for fever, chills, weight loss and malaise/fatigue.  HENT: Positive for hearing loss. Negative for congestion.   Eyes: Negative for blurred vision.  Respiratory: Negative for shortness of breath.   Cardiovascular: Negative for chest pain and leg swelling.  Gastrointestinal: Negative for abdominal pain, constipation, blood in stool and melena.  Genitourinary: Negative for dysuria, urgency and frequency.  Musculoskeletal: Negative for falls.  Skin: Positive for itching and rash.       Left lower back, groin  Neurological: Negative for dizziness, weakness and headaches.  Endo/Heme/Allergies: Does not bruise/bleed easily.  Psychiatric/Behavioral: Negative for depression and memory loss.    Past Medical History  Diagnosis Date  . Hypercholesterolemia   . Edema of male genital organs 08/20/2012  . Chest pain, unspecified 08/20/2012  . Nocturia 08/20/2012  . 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 Chest pain, unspecified 12/26/2011  . Diverticulosis of colon (without mention of hemorrhage) 06/14/2010  . Elevated blood pressure reading without diagnosis of hypertension 06/14/2010  . Unspecified constipation 05/11/2009  . Urinary tract infection, site not specified 05/11/2009  . Lumbago 05/11/2009  . Carpal tunnel syndrome 03/16/2009  .  Encounter for long-term (current) use of other medications   . 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 Corns and callosities 04/11/2007  . Pain in joint, ankle and foot 04/11/2007  . Ulcer of other part of foot 03/16/2007  . Allergic rhinitis, cause unspecified 09/08/2006  . Pterygium, unspecified 03/01/2006  . Other and unspecified hyperlipidemia 02/24/2006  . COPD (chronic obstructive pulmonary disease) 02/24/2006  . Hypertrophy of prostate without urinary  obstruction and other lower urinary tract symptoms (LUTS)   . Dermatophytosis of the body   . Tobacco use disorder   . Disturbance of skin sensation   . Ventral hernia, unspecified, without mention of obstruction or gangrene   . Seborrheic dermatitis, unspecified 03/21/2001  . Rash and other nonspecific skin eruption 12/28/1999  . Concussion, unspecified 10/30/1994    Past Surgical History  Procedure Laterality Date  . Elbow surgery Right   . Knee surgery Right 01/2007    knee cap injury  . Tonsillectomy  1948  . Fracture surgery  1986    right hip, right elbow  . Skin cancer excision  07/2014    Back and head     Allergies  Allergen Reactions  . Zocor [Simvastatin] Shortness Of Breath    Sob and itching  . Lipitor [Atorvastatin]     Muscle and bone pain but tolerated pravachol   Medications: Patient's Medications  New Prescriptions   No medications on file  Previous Medications   ASPIRIN 81 MG TABLET    Take 81 mg by mouth daily. Take one tablet once a day   LISINOPRIL (PRINIVIL,ZESTRIL) 5 MG TABLET    TAKE 1 TABLET EVERY DAY TO CONTROL BLOOD PRESSURE  Modified Medications   No medications on file  Discontinued Medications   EZETIMIBE (ZETIA) 10 MG TABLET    Take 1 tablet (10 mg total) by mouth daily.    Physical Exam: Filed Vitals:   12/26/14 0806  BP: 130/70  Pulse: 53  Temp: 97.4 F (36.3 C)  TempSrc: Oral  Resp: 20  Height: 5\' 7"  (1.702 m)  Weight: 134 lb 3.2 oz (60.873 kg)  SpO2: 96%   Physical Exam  Constitutional: He is oriented to person, place, and time. No distress.  Thin white male  HENT:  Head: Normocephalic and atraumatic.  Cardiovascular: Normal rate, regular rhythm, normal heart sounds and intact distal pulses.   Pulmonary/Chest: Effort normal and breath sounds normal. No respiratory distress.  Abdominal: Soft. Bowel sounds are normal. He exhibits no distension. There is no tenderness.  Musculoskeletal: Normal range of motion.    Neurological: He is alert and oriented to person, place, and time.  Skin: Skin is warm and dry.  Patch size of a palm on his left lower back that is pink with excoriated scaly skin overlying  Psychiatric: He has a normal mood and affect.    Labs reviewed: Basic Metabolic Panel:  Recent Labs  09/30/14 0817 12/24/14 0816  NA 139 136  K 4.6 4.9  CL 102 98  CO2 25 24  GLUCOSE 94 86  BUN 11 14  CREATININE 1.29* 1.19  CALCIUM 8.8 8.9   Liver Function Tests:  Recent Labs  09/30/14 0817 12/24/14 0816  AST 16 13  ALT 6 7  ALKPHOS 54 57  BILITOT 0.4 0.3  PROT 6.3 6.5   No results for input(s): LIPASE, AMYLASE in the last 8760 hours. No results for input(s): AMMONIA in the last 8760 hours. CBC:  Recent Labs  09/30/14 0817 12/24/14 1601  WBC 6.5 5.4  NEUTROABS 4.2 3.4  HCT 39.9 41.7   Lipid Panel:  Recent Labs  09/30/14 0817 12/24/14 0816  CHOL 222* 259*  HDL 54 52  LDLCALC 154* 192*  TRIG 72 74  CHOLHDL 4.1 5.0   Lab Results  Component Value Date   HGBA1C 6.0* 12/24/2014   Assessment/Plan 1. Atopic eczema -left lower back -apply hydrocortisone otc 1% to this area up to 4x daily as needed for itching  2. Hypercholesterolemia - refuses therapy for this -discussed dietary changes with him today -got statin myopathy but then when zetia prescribed it was too expensive and he opted not to pick up the discount card we offered him - Hemoglobin A1c; Future - Lipid panel; Future  3. Essential hypertension, benign -bp record at home was good majority of the time and today bp was at goal at 130/70 -cont lisinopril therapy - CBC with Differential/Platelet; Future - Basic metabolic panel; Future  4. Rash of groin -cont using lotrisone when this flares up--has been effective  5. Constipation, slow transit -resolved he says since statin therapy discontinued  6. Hearing loss, bilateral -has hearing aides that are newly adjusted--says still not to  perfection--has a f/u in 3 mos for one more adjustment--keep this  7. History of non-penetrating eye injury - due for routine f/u with Dr. Clent Jacks next week - Ambulatory referral to Ophthalmology   Labs/tests ordered:   Orders Placed This Encounter  Procedures  . CBC with Differential/Platelet    Standing Status: Future     Number of Occurrences:      Standing Expiration Date: 06/28/2015  . Basic metabolic panel    Standing Status: Future     Number of Occurrences:      Standing Expiration Date: 06/28/2015    Order Specific Question:  Has the patient fasted?    Answer:  Yes  . Hemoglobin A1c    Standing Status: Future     Number of Occurrences:      Standing Expiration Date: 06/28/2015  . Lipid panel    Standing Status: Future     Number of Occurrences:      Standing Expiration Date: 06/28/2015    Order Specific Question:  Has the patient fasted?    Answer:  Yes  . Ambulatory referral to Ophthalmology    Referral Priority:  Routine    Referral Type:  Consultation    Referral Reason:  Specialty Services Required    Requested Specialty:  Ophthalmology    Number of Visits Requested:  1    Next appt:  Annual exam with labs before  [place] Rayjon Wery, D.O. [facility] [address], [place] [address] Cell Phone (Mon-Fri 8am-5pm):  [phone] On Call:  [phone] & follow prompts after 5pm & weekends Office Phone:  [phone] Office Fax:  [phone]

## 2014-12-26 NOTE — Patient Instructions (Addendum)
Hydrocortisone 1% cream to left lower back rash.   Cont the lotrisone to your groin as needed  Please bring [place] a copy of your living will and health care power of attorney.

## 2015-01-01 DIAGNOSIS — H04123 Dry eye syndrome of bilateral lacrimal glands: Secondary | ICD-10-CM | POA: Diagnosis not present

## 2015-01-01 DIAGNOSIS — H25813 Combined forms of age-related cataract, bilateral: Secondary | ICD-10-CM | POA: Diagnosis not present

## 2015-02-22 ENCOUNTER — Encounter (HOSPITAL_COMMUNITY): Payer: Self-pay

## 2015-02-22 ENCOUNTER — Emergency Department (HOSPITAL_COMMUNITY)
Admission: EM | Admit: 2015-02-22 | Discharge: 2015-02-22 | Disposition: A | Discharge status: Home / Self Care | Payer: Commercial Managed Care - HMO | Attending: Emergency Medicine | Admitting: Emergency Medicine

## 2015-02-22 ENCOUNTER — Emergency Department (HOSPITAL_COMMUNITY): Payer: Commercial Managed Care - HMO

## 2015-02-22 DIAGNOSIS — J441 Chronic obstructive pulmonary disease with (acute) exacerbation: Secondary | ICD-10-CM | POA: Diagnosis not present

## 2015-02-22 DIAGNOSIS — Z87828 Personal history of other (healed) physical injury and trauma: Secondary | ICD-10-CM | POA: Diagnosis not present

## 2015-02-22 DIAGNOSIS — R51 Headache: Secondary | ICD-10-CM | POA: Diagnosis not present

## 2015-02-22 DIAGNOSIS — Z8719 Personal history of other diseases of the digestive system: Secondary | ICD-10-CM | POA: Insufficient documentation

## 2015-02-22 DIAGNOSIS — Z87891 Personal history of nicotine dependence: Secondary | ICD-10-CM | POA: Insufficient documentation

## 2015-02-22 DIAGNOSIS — Z7982 Long term (current) use of aspirin: Secondary | ICD-10-CM | POA: Insufficient documentation

## 2015-02-22 DIAGNOSIS — Z87440 Personal history of urinary (tract) infections: Secondary | ICD-10-CM | POA: Diagnosis not present

## 2015-02-22 DIAGNOSIS — J449 Chronic obstructive pulmonary disease, unspecified: Secondary | ICD-10-CM | POA: Diagnosis not present

## 2015-02-22 DIAGNOSIS — Z8669 Personal history of other diseases of the nervous system and sense organs: Secondary | ICD-10-CM | POA: Diagnosis not present

## 2015-02-22 DIAGNOSIS — Z872 Personal history of diseases of the skin and subcutaneous tissue: Secondary | ICD-10-CM | POA: Diagnosis not present

## 2015-02-22 DIAGNOSIS — Z87438 Personal history of other diseases of male genital organs: Secondary | ICD-10-CM | POA: Diagnosis not present

## 2015-02-22 DIAGNOSIS — R0602 Shortness of breath: Secondary | ICD-10-CM | POA: Diagnosis not present

## 2015-02-22 DIAGNOSIS — Z8639 Personal history of other endocrine, nutritional and metabolic disease: Secondary | ICD-10-CM | POA: Insufficient documentation

## 2015-02-22 DIAGNOSIS — Z85828 Personal history of other malignant neoplasm of skin: Secondary | ICD-10-CM | POA: Insufficient documentation

## 2015-02-22 DIAGNOSIS — R05 Cough: Secondary | ICD-10-CM | POA: Diagnosis not present

## 2015-02-22 DIAGNOSIS — R0609 Other forms of dyspnea: Secondary | ICD-10-CM

## 2015-02-22 LAB — BASIC METABOLIC PANEL
ANION GAP: 8 (ref 5–15)
BUN: 13 mg/dL (ref 6–20)
CALCIUM: 9 mg/dL (ref 8.9–10.3)
CO2: 25 mmol/L (ref 22–32)
CREATININE: 1.23 mg/dL (ref 0.61–1.24)
Chloride: 104 mmol/L (ref 101–111)
GFR, EST NON AFRICAN AMERICAN: 56 mL/min — AB (ref >60)
Glucose, Bld: 101 mg/dL — ABNORMAL HIGH (ref 65–99)
Potassium: 4.6 mmol/L (ref 3.5–5.1)
SODIUM: 137 mmol/L (ref 135–145)

## 2015-02-22 LAB — CBC
HCT: 38.8 % — ABNORMAL LOW (ref 39.0–52.0)
Hemoglobin: 12.4 g/dL — ABNORMAL LOW (ref 13.0–17.0)
MCH: 28.4 pg (ref 26.0–34.0)
MCHC: 32 g/dL (ref 30.0–36.0)
MCV: 89 fL (ref 78.0–100.0)
PLATELETS: 322 10*3/uL (ref 150–400)
RBC: 4.36 MIL/uL (ref 4.22–5.81)
RDW: 13.2 % (ref 11.5–15.5)
WBC: 8.4 10*3/uL (ref 4.0–10.5)

## 2015-02-22 LAB — I-STAT TROPONIN, ED: Troponin i, poc: 0 ng/mL (ref 0.00–0.08)

## 2015-02-22 MED ORDER — DOXYCYCLINE HYCLATE 100 MG PO TABS: 100.0000 mg | ORAL_TABLET | Freq: Two times a day (BID) | ORAL | Status: DC

## 2015-02-22 MED ORDER — DEXAMETHASONE SODIUM PHOSPHATE 10 MG/ML IJ SOLN
10.0000 mg | Freq: Once | INTRAMUSCULAR | Status: AC
  Administered 2015-02-22: 10 mg via INTRAMUSCULAR
  Filled 2015-02-22: qty 1

## 2015-02-22 MED ORDER — ALBUTEROL SULFATE (2.5 MG/3ML) 0.083% IN NEBU
5.0000 mg | INHALATION_SOLUTION | Freq: Once | RESPIRATORY_TRACT | Status: AC
  Administered 2015-02-22: 5 mg via RESPIRATORY_TRACT
  Filled 2015-02-22: qty 6

## 2015-02-22 NOTE — ED Notes (Addendum)
Pt reported SHOB, productive cough of yellowish and clear sputum at times, dizziness/lightheadedness while working outside but denies visual disturbances, headaches or LOC. Aus diminish breath sounds bil and throughout. Resp even and unlabored.Family at bedside.

## 2015-02-22 NOTE — Discharge Instructions (Signed)
Work-up unremarkable for any serious causes of your shortness of breath Will treat you as a COPD exacerbation. You received a steroid shot and a prescription for antibiotic was given If symptoms not improved or better in the next week please follow-up with your PCP If shortness of breath worsens return to ED. Please see below section on when to get help right away.    Chronic Obstructive Pulmonary Disease Chronic obstructive pulmonary disease (COPD) is a common lung problem. In COPD, the flow of air from the lungs is limited. The way your lungs work will probably never return to normal, but there are things you can do to improve your lungs and make yourself feel better. HOME CARE  Take all medicines as told by your doctor.  Avoid medicines or cough syrups that dry up your airway (such as antihistamines) and do not allow you to get rid of thick spit. You do not need to avoid them if told differently by your doctor.  If you smoke, stop. Smoking makes the problem worse.  Avoid being around things that make your breathing worse (like smoke, chemicals, and fumes).  Use oxygen therapy and therapy to help improve your lungs (pulmonary rehabilitation) if told by your doctor. If you need home oxygen therapy, ask your doctor if you should buy a tool to measure your oxygen level (oximeter).  Avoid people who have a sickness you can catch (contagious).  Avoid going outside when it is very hot, cold, or humid.  Eat healthy foods. Eat smaller meals more often. Rest before meals.  Stay active, but remember to also rest.  Make sure to get all the shots (vaccines) your doctor recommends. Ask your doctor if you need a pneumonia shot.  Learn and use tips on how to relax.  Learn and use tips on how to control your breathing as told by your doctor. Try:  Breathing in (inhaling) through your nose for 1 second. Then, pucker your lips and breath out (exhale) through your lips for 2 seconds.  Putting one  hand on your belly (abdomen). Breathe in slowly through your nose for 1 second. Your hand on your belly should move out. Pucker your lips and breathe out slowly through your lips. Your hand on your belly should move in as you breathe out.  Learn and use controlled coughing to clear thick spit from your lungs. The steps are: 1. Lean your head a little forward. 2. Breathe in deeply. 3. Try to hold your breath for 3 seconds. 4. Keep your mouth slightly open while coughing 2 times. 5. Spit any thick spit out into a tissue. 6. Rest and do the steps again 1 or 2 times as needed. GET HELP IF:  You cough up more thick spit than usual.  There is a change in the color or thickness of the spit.  It is harder to breathe than usual.  Your breathing is faster than usual. GET HELP RIGHT AWAY IF:   You have shortness of breath while resting.  You have shortness of breath that stops you from:  Being able to talk.  Doing normal activities.  You chest hurts for longer than 5 minutes.  Your skin color is more blue than usual.  Your pulse oximeter shows that you have low oxygen for longer than 5 minutes. MAKE SURE YOU:   Understand these instructions.  Will watch your condition.  Will get help right away if you are not doing well or get worse. Document Released: 11/09/2007 Document  Revised: 10/07/2013 Document Reviewed: 01/17/2013 [facility] Patient Information 2015 [place], [place]. This information is not intended to replace advice given to you by your health care provider. Make sure you discuss any questions you have with your health care provider.

## 2015-02-22 NOTE — ED Notes (Signed)
Patient complaining of hernia pain. Patient stated that when he turns he has to push it back in and its painful. Patient doesnt know if this has anything to do with his shortness of breath.

## 2015-02-22 NOTE — ED Provider Notes (Signed)
CSN: [id number]     Arrival date & time 02/22/15  3785 History   First MD Initiated Contact with Patient 02/22/15 1024     Chief Complaint  Patient presents with  . Shortness of Breath    HPI Comments: Patient presents to ED with dyspnea and cough for about a week, He states that when he coughs he feels like he can't get air. Also dyspnea worse with exertion. He usually does not get short of breath with exertion. Cough has been productive of sputum that is yellow in color. He is a former smoker and has COPD but not on any medications for it. Denies fever, chest pain.  Patient is a 74 y.o. male presenting with shortness of breath. The history is provided by the patient and the spouse.  Shortness of Breath Severity:  Moderate Onset quality:  Sudden Duration:  1 week Associated symptoms: cough and headaches   Associated symptoms: no abdominal pain, no chest pain and no fever   Risk factors: tobacco use   Risk factors: no hx of PE/DVT      Past Medical History  Diagnosis Date  . Hypercholesterolemia   . Edema of male genital organs 08/20/2012  . Chest pain, unspecified 08/20/2012  . Nocturia 08/20/2012  . 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 Chest pain, unspecified 12/26/2011  . Diverticulosis of colon (without mention of hemorrhage) 06/14/2010  . Elevated blood pressure reading without diagnosis of hypertension 06/14/2010  . Unspecified constipation 05/11/2009  . Urinary tract infection, site not specified 05/11/2009  . Lumbago 05/11/2009  . Carpal tunnel syndrome 03/16/2009  . Encounter for long-term (current) use of other medications   . 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 Corns and callosities 04/11/2007  . Pain in joint, ankle and foot 04/11/2007  . Ulcer of other part of foot 03/16/2007  . Allergic rhinitis, cause unspecified 09/08/2006  . Pterygium, unspecified 03/01/2006  . Other and unspecified hyperlipidemia  02/24/2006  . COPD (chronic obstructive pulmonary disease) 02/24/2006  . Hypertrophy of prostate without urinary obstruction and other lower urinary tract symptoms (LUTS)   . Dermatophytosis of the body   . Tobacco use disorder   . Disturbance of skin sensation   . Ventral hernia, unspecified, without mention of obstruction or gangrene   . Seborrheic dermatitis, unspecified 03/21/2001  . Rash and other nonspecific skin eruption 12/28/1999  . Concussion, unspecified 10/30/1994   Past Surgical History  Procedure Laterality Date  . Elbow surgery Right   . Knee surgery Right 01/2007    knee cap injury  . Tonsillectomy  1948  . Fracture surgery  1986    right hip, right elbow  . Skin cancer excision  07/2014    Back and head    Family History  Problem Relation Age of Onset  . Diabetes Mother   . Hypertension Mother   . Cancer Father    Social History  Substance Use Topics  . Smoking status: Former Smoker    Quit date: 06/09/2010  . Smokeless tobacco: Never Used  . Alcohol Use: No    Review of Systems  Constitutional: Negative for fever.  HENT: Positive for congestion.   Respiratory: Positive for cough and shortness of breath.   Cardiovascular: Negative for chest pain.  Gastrointestinal: Negative for abdominal pain.  Neurological: Positive for headaches.  Also per HPI  Allergies  Zocor and Lipitor  Home Medications   Prior to Admission medications  Medication Sig Start Date End Date Taking? Authorizing Provider  aspirin 81 MG tablet Take 81 mg by mouth daily. Take one tablet once a day   Yes Historical Provider, MD  ibuprofen (ADVIL,MOTRIN) 200 MG tablet Take 400 mg by mouth every 4 (four) hours as needed for fever, headache, mild pain, moderate pain or cramping.   Yes Historical Provider, MD  lisinopril (PRINIVIL,ZESTRIL) 5 MG tablet TAKE 1 TABLET EVERY DAY TO CONTROL BLOOD PRESSURE 08/06/14  Yes Estill Dooms, MD  Pseudoephedrine-Naproxen Na ([facility] SINUS & COLD-D PO)  Take 2 tablets by mouth every 6 (six) hours as needed (for cold).   Yes Historical Provider, MD  doxycycline (VIBRA-TABS) 100 MG tablet Take 1 tablet (100 mg total) by mouth 2 (two) times daily. 02/22/15   Katheren Shams, DO   BP 132/72 mmHg  Pulse 69  Temp(Src) 98.1 F (36.7 C) (Oral)  Resp 18  SpO2 96% Physical Exam  Constitutional: He is oriented to person, place, and time. He appears well-developed. No distress.  HENT:  Head: Normocephalic and atraumatic.  Mouth/Throat: Oropharynx is clear and moist.  Neck: Normal range of motion. Neck supple. No JVD present.  Cardiovascular: Normal rate, regular rhythm and intact distal pulses.   Pulmonary/Chest: Effort normal and breath sounds normal. No respiratory distress. He has no wheezes. He has no rales.  Abdominal: Soft. Bowel sounds are normal. There is no tenderness.  Musculoskeletal: Normal range of motion. He exhibits no edema or tenderness.  Neurological: He is alert and oriented to person, place, and time.  Skin: Skin is warm and dry.  Psychiatric: He has a normal mood and affect.    ED Course  Procedures (including critical care time) Labs Review Labs Reviewed  CBC - Abnormal; Notable for the following:    Hemoglobin 12.4 (*)    HCT 38.8 (*)    All other components within normal limits  BASIC METABOLIC PANEL - Abnormal; Notable for the following:    Glucose, Bld 101 (*)    GFR calc non Af Amer 56 (*)    All other components within normal limits  Randolm Idol, ED    Imaging Review Dg Chest 2 View  02/22/2015   CLINICAL DATA:  COPD, productive cough  EXAM: CHEST  2 VIEW  COMPARISON:  12/03/2013  FINDINGS: Cardiomediastinal silhouette is stable. Hyperinflation again noted. There is better visualized nodular density in right apex measures 1.7 cm. Further correlation with CT scan of the chest is recommended to exclude a lung nodule.  IMPRESSION: Hyperinflation again noted. No acute infiltrate or pulmonary edema. Better  visualized nodular density in right apex measures 1.7 cm. Further correlation with CT scan of the chest is recommended.   Electronically Signed   By: Lahoma Crocker M.D.   On: 02/22/2015 10:39   I have personally reviewed and evaluated these images and lab results as part of my medical decision-making.   EKG Interpretation   Date/Time:  Sunday February 22 2015 09:54:49 EDT Ventricular Rate:  89 PR Interval:  153 QRS Duration: 74 QT Interval:  357 QTC Calculation: 434 R Axis:   6 Text Interpretation:  Sinus rhythm Abnormal R-wave progression, early  transition Baseline wander in lead(s) V2 Confirmed by BEATON  MD, ROBERT  ([id number]) on 02/22/2015 9:58:35 AM      MDM   Final diagnoses:  Dyspnea on exertion   Patient presented with dyspnea on exertion. Symptoms possibly due to COPD vs angina as top diagnosis. Unlikely ACS; Heart score  puts patient in low risk category and he is not complaining of chest pain. Wells score 0 making PE less likely.   Work-up was unremarkable. EKG and troponin normal. CXR only with signs of hyperinflation (nodular density to be followed up as outpatient). Other lab test normal as well.   Treatments in ED included albuterol nebulizer without much relief but some improvement in lung capacity pr patient. Treated like a COPD exacerbation with antibiotics and IM sterid injection.   Patient stable for discharge. Discussed findings and presumed diagnosis. Patient to follow-up with PCP for further evaluation and work-up if symptoms not improved. Return precautions given.    Luiz Blare, DO 02/22/2015, 11:11 AM PGY-2, Leon, DO 02/22/15 2057  Leonard Schwartz, MD 02/23/15 2330

## 2015-02-22 NOTE — ED Notes (Signed)
Patient states he has had increased SOB in the past 2 weeks especially at night and when working. Patient states he has had a productive cough with clear to yellow sputum.

## 2015-04-01 ENCOUNTER — Other Ambulatory Visit: Payer: Commercial Managed Care - HMO

## 2015-04-01 DIAGNOSIS — E7800 Pure hypercholesterolemia, unspecified: Secondary | ICD-10-CM | POA: Diagnosis not present

## 2015-04-01 DIAGNOSIS — I10 Essential (primary) hypertension: Secondary | ICD-10-CM

## 2015-04-02 LAB — CBC WITH DIFFERENTIAL/PLATELET
Basophils Absolute: 0 10*3/uL (ref 0.0–0.2)
Basos: 0 %
EOS (ABSOLUTE): 0.2 10*3/uL (ref 0.0–0.4)
Eos: 4 %
Hematocrit: 41.2 % (ref 37.5–51.0)
Hemoglobin: 13.2 g/dL (ref 12.6–17.7)
Immature Grans (Abs): 0 10*3/uL (ref 0.0–0.1)
Immature Granulocytes: 0 %
Lymphocytes Absolute: 1.2 10*3/uL (ref 0.7–3.1)
Lymphs: 24 %
MCH: 28.1 pg (ref 26.6–33.0)
MCHC: 32 g/dL (ref 31.5–35.7)
MCV: 88 fL (ref 79–97)
Monocytes Absolute: 0.4 10*3/uL (ref 0.1–0.9)
Monocytes: 8 %
Neutrophils Absolute: 3.4 10*3/uL (ref 1.4–7.0)
Neutrophils: 64 %
Platelets: 271 10*3/uL (ref 150–379)
RBC: 4.69 x10E6/uL (ref 4.14–5.80)
RDW: 13.9 % (ref 12.3–15.4)
WBC: 5.3 10*3/uL (ref 3.4–10.8)

## 2015-04-02 LAB — LIPID PANEL
Chol/HDL Ratio: 4.3 ratio units (ref 0.0–5.0)
Cholesterol, Total: 252 mg/dL — ABNORMAL HIGH (ref 100–199)
HDL: 58 mg/dL (ref >39)
LDL Calculated: 181 mg/dL — ABNORMAL HIGH (ref 0–99)
Triglycerides: 64 mg/dL (ref 0–149)
VLDL Cholesterol Cal: 13 mg/dL (ref 5–40)

## 2015-04-02 LAB — BASIC METABOLIC PANEL
BUN/Creatinine Ratio: 9 — ABNORMAL LOW (ref 10–22)
BUN: 12 mg/dL (ref 8–27)
CO2: 24 mmol/L (ref 18–29)
Calcium: 9.1 mg/dL (ref 8.6–10.2)
Chloride: 99 mmol/L (ref 97–106)
Creatinine, Ser: 1.27 mg/dL (ref 0.76–1.27)
GFR calc Af Amer: 64 mL/min/{1.73_m2} (ref >59)
GFR calc non Af Amer: 55 mL/min/{1.73_m2} — ABNORMAL LOW (ref >59)
Glucose: 92 mg/dL (ref 65–99)
Potassium: 4.8 mmol/L (ref 3.5–5.2)
Sodium: 139 mmol/L (ref 136–144)

## 2015-04-02 LAB — HEMOGLOBIN A1C
Est. average glucose Bld gHb Est-mCnc: 126 mg/dL
Hgb A1c MFr Bld: 6 % — ABNORMAL HIGH (ref 4.8–5.6)

## 2015-04-03 ENCOUNTER — Ambulatory Visit (INDEPENDENT_AMBULATORY_CARE_PROVIDER_SITE_OTHER): Payer: Commercial Managed Care - HMO | Admitting: Internal Medicine

## 2015-04-03 ENCOUNTER — Encounter: Payer: Self-pay | Admitting: Internal Medicine

## 2015-04-03 VITALS — BP 144/80 | HR 63 | Temp 97.5°F | Resp 14 | Ht 67.08 in | Wt 129.8 lb

## 2015-04-03 DIAGNOSIS — Z23 Encounter for immunization: Secondary | ICD-10-CM

## 2015-04-03 DIAGNOSIS — R911 Solitary pulmonary nodule: Secondary | ICD-10-CM

## 2015-04-03 DIAGNOSIS — R3912 Poor urinary stream: Secondary | ICD-10-CM

## 2015-04-03 DIAGNOSIS — H9193 Unspecified hearing loss, bilateral: Secondary | ICD-10-CM

## 2015-04-03 DIAGNOSIS — E7800 Pure hypercholesterolemia, unspecified: Secondary | ICD-10-CM | POA: Diagnosis not present

## 2015-04-03 DIAGNOSIS — K5901 Slow transit constipation: Secondary | ICD-10-CM

## 2015-04-03 DIAGNOSIS — Z0000 Encounter for general adult medical examination without abnormal findings: Secondary | ICD-10-CM

## 2015-04-03 DIAGNOSIS — N401 Enlarged prostate with lower urinary tract symptoms: Secondary | ICD-10-CM

## 2015-04-03 DIAGNOSIS — I10 Essential (primary) hypertension: Secondary | ICD-10-CM

## 2015-04-03 MED ORDER — TAMSULOSIN HCL 0.4 MG PO CAPS: 0.4000 mg | ORAL_CAPSULE | Freq: Every day | ORAL | Status: DC

## 2015-04-03 NOTE — Progress Notes (Signed)
Patient ID: Thomas Reyes, male   DOB: [birth year], 74 y.o.   MRN: [id number]   Location: [place]  Provider: Rexene Edison. Mariea Clonts, D.O., C.M.D.  Code Status: full code Goals of Care: Advanced Directive information Does patient have an advance directive?: Yes, Type of Advance Directive: Living will, Does patient want to make changes to advanced directive?: No - Patient declined  Chief Complaint  Patient presents with  . Annual Exam    Yearly check-up, EKG updated in 02/2015, discuss labs (copy printed)   . MMSE    28/30, failed clock drawing  . Medical Management of Chronic Issues    HTN,High Cholesterol, and Hearing loss     HPI: Patient is a 74 y.o. male seen in the office today for an annual wellness exam.    Depression screen [facility] 2/9 04/03/2015 03/31/2014 01/18/2013  Decreased Interest 0 0 0  Down, Depressed, Hopeless 0 0 0  PHQ - 2 Score 0 0 0    Fall Risk  04/03/2015 12/26/2014 10/02/2014 06/30/2014 03/31/2014  Falls in the past year? No No No No No   MMSE - Mini Mental State Exam 04/03/2015  Orientation to time 4  Orientation to Place 5  Registration 3  Attention/ Calculation 5  Recall 2  Language- name 2 objects 2  Language- repeat 1  Language- follow 3 step command 3  Language- read & follow direction 1  Write a sentence 1  Copy design 1  Total score 28  clock was close, but still a fail.  Functional Status Survey: Is the patient deaf or have difficulty hearing?: Yes Does the patient have difficulty seeing, even when wearing glasses/contacts?: No Does the patient have difficulty concentrating, remembering, or making decisions?: No Does the patient have difficulty walking or climbing stairs?: No Does the patient have difficulty dressing or bathing?: No Does the patient have difficulty doing errands alone such as visiting a doctor's office or shopping?: No   Health Maintenance  Topic Date Due  . ZOSTAVAX  05/14/2000  . INFLUENZA VACCINE  01/05/2015    . COLONOSCOPY  04/16/2023  . TETANUS/TDAP  12/04/2023  . PNA vac Low Risk Adult  Completed   Urinary incontinence?  Does have some difficulty making it to the restroom.  Used to not have to get up at night.  Now up 2x at night.  Sometimes hard to start stream, other times ok.  No leakage.    Constipation:  Had to take medication for his bowels to go Sunday and hasn't been since.  Has had gas.  Getting some pain in the lower left quadrant.  Has been eating regularly.  Used to go every day.  Diet is about the same.  No blood in stool or dark stool.  Had taken two dulcolax and went a ton on Sunday--went several times and like water by the time he was done.    Went to ED in Sept with shortness of breath.  Takes nothing for "COPD" but he's not on medication.  Has never seen lung specialist.  His cough, congestion, chest pain went away within a week.  He was treated with steroids and nebs.  Did smoke in past and has hyperinflation on CXR  But never had PFTs.  CXR in ED on 9/18 showed 1.7cm nodular density at right apex that was not seen in 11/2013.    Functional status?  Is independent.    Exercise?  Is physically active for work--cutting wood, other manual labor, volunteer  work.  No dedicated exercise.    Diet?  Does not eat fruits and veggies.  Has lost 5 lbs.  Does not eat much when it's hot out.   Also does not drink water b/c he does not like it.    Vision: OU 20/40, OD 20/70, OS 20/50--says he is supposed to wear glasses--needs referral for January possible cataract removal  Hearing: HOH  Dentition:  Does not see dentist regularly--says he needs to go have the rest of his teeth pulled.  Has no bottom teeth now, but can't afford teeth.  Is to see derm again in Feb to f/u on skin cancer of left face.  Review of Systems:  Review of Systems  Constitutional: Positive for weight loss. Negative for fever, chills and malaise/fatigue.  HENT: Positive for hearing loss. Negative for congestion.         Hearing aides have helped considerably; can't afford dentis  Eyes:       Visual deficit and does not wear glasses as he should  Respiratory: Negative for cough, shortness of breath and wheezing.   Cardiovascular: Negative for chest pain and leg swelling.  Gastrointestinal: Positive for constipation. Negative for abdominal pain, blood in stool and melena.  Genitourinary: Positive for frequency. Negative for dysuria and hematuria.       Difficulty with stream  Musculoskeletal: Negative for myalgias, joint pain and falls.  Skin: Negative for rash.       Skin cancer left face  Neurological: Negative for dizziness, sensory change, speech change, loss of consciousness and weakness.       Facial droop  Psychiatric/Behavioral: Positive for memory loss.    Past Medical History  Diagnosis Date  . Hypercholesterolemia   . Edema of male genital organs 08/20/2012  . Chest pain, unspecified 08/20/2012  . Nocturia 08/20/2012  . 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 Chest pain, unspecified 12/26/2011  . Diverticulosis of colon (without mention of hemorrhage) 06/14/2010  . Elevated blood pressure reading without diagnosis of hypertension 06/14/2010  . Unspecified constipation 05/11/2009  . Urinary tract infection, site not specified 05/11/2009  . Lumbago 05/11/2009  . Carpal tunnel syndrome 03/16/2009  . Encounter for long-term (current) use of other medications   . 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 Corns and callosities 04/11/2007  . Pain in joint, ankle and foot 04/11/2007  . Ulcer of other part of foot 03/16/2007  . Allergic rhinitis, cause unspecified 09/08/2006  . Pterygium, unspecified 03/01/2006  . Other and unspecified hyperlipidemia 02/24/2006  . COPD (chronic obstructive pulmonary disease) ([place]) 02/24/2006  . Hypertrophy of prostate without urinary obstruction and other lower urinary tract symptoms (LUTS)   . Dermatophytosis of  the body   . Tobacco use disorder   . Disturbance of skin sensation   . Ventral hernia, unspecified, without mention of obstruction or gangrene   . Seborrheic dermatitis, unspecified 03/21/2001  . Rash and other nonspecific skin eruption 12/28/1999  . Concussion, unspecified 10/30/1994    Past Surgical History  Procedure Laterality Date  . Elbow surgery Right   . Knee surgery Right 01/2007    knee cap injury  . Tonsillectomy  1948  . Fracture surgery  1986    right hip, right elbow  . Skin cancer excision  07/2014    Back and head    Social History   Social History  . Marital Status: Married    Spouse Name: N/A  . Number of  Children: N/A  . Years of Education: N/A   Occupational History  . Not on file.   Social History Main Topics  . Smoking status: Former Smoker    Quit date: 06/09/2010  . Smokeless tobacco: Never Used  . Alcohol Use: No  . Drug Use: No  . Sexual Activity: Not on file   Other Topics Concern  . Not on file   Social History Narrative   Family History  Problem Relation Age of Onset  . Diabetes Mother   . Hypertension Mother   . Cancer Father      Allergies  Allergen Reactions  . Zocor [Simvastatin] Shortness Of Breath    Sob and itching  . Lipitor [Atorvastatin]     Muscle and bone pain but tolerated pravachol      Medication List       This list is accurate as of: 04/03/15  9:18 AM.  Always use your most recent med list.               aspirin 81 MG tablet  Take 81 mg by mouth daily. Take one tablet once a day     ibuprofen 200 MG tablet  Commonly known as:  ADVIL,MOTRIN  Take 400 mg by mouth every 4 (four) hours as needed for fever, headache, mild pain, moderate pain or cramping.     lisinopril 5 MG tablet  Commonly known as:  PRINIVIL,ZESTRIL  TAKE 1 TABLET EVERY DAY TO CONTROL BLOOD PRESSURE        Physical Exam: Filed Vitals:   04/03/15 0848  BP: 144/80  Pulse: 63  Temp: 97.5 F (36.4 C)  TempSrc: Oral    Resp: 14  Height: 5' 7.07" (1.704 m)  Weight: 129 lb 12.8 oz (58.877 kg)  SpO2: 97%   Body mass index is 20.28 kg/(m^2). Physical Exam  Constitutional: He is oriented to person, place, and time. No distress.  Thin white male  HENT:  Head: Normocephalic and atraumatic.  Right Ear: External ear normal.  Left Ear: External ear normal.  Nose: Nose normal.  Mouth/Throat: Oropharynx is clear and moist. No oropharyngeal exudate.  Bilateral hearing aides  Eyes: Conjunctivae and EOM are normal. Pupils are equal, round, and reactive to light.  Neck: Normal range of motion. Neck supple. No JVD present. No thyromegaly present.  Cardiovascular: Normal rate, regular rhythm, normal heart sounds and intact distal pulses.   Pulmonary/Chest: Effort normal and breath sounds normal. No respiratory distress.  Abdominal: Soft. Bowel sounds are normal. He exhibits no distension and no mass. There is no tenderness.  Musculoskeletal: Normal range of motion. He exhibits no edema or tenderness.  Lymphadenopathy:    He has no cervical adenopathy.  Neurological: He is alert and oriented to person, place, and time. He displays normal reflexes. A cranial nerve deficit is present. He exhibits normal muscle tone. Coordination normal.  Facial droop  Skin: Skin is warm and dry.  Area on left cheek being monitored by derm  Psychiatric: He has a normal mood and affect.    Labs reviewed: Basic Metabolic Panel:  Recent Labs  12/24/14 0816 02/22/15 1216 04/01/15 0851  NA 136 137 139  K 4.9 4.6 4.8  CL 98 104 99  CO2 24 25 24   GLUCOSE 86 101* 92  BUN 14 13 12   CREATININE 1.19 1.23 1.27  CALCIUM 8.9 9.0 9.1   Liver Function Tests:  Recent Labs  09/30/14 0817 12/24/14 0816  AST 16 13  ALT 6 7  ALKPHOS 54 57  BILITOT 0.4 0.3  PROT 6.3 6.5  ALBUMIN 3.8 4.0   No results for input(s): LIPASE, AMYLASE in the last 8760 hours. No results for input(s): AMMONIA in the last 8760 hours. CBC:  Recent  Labs  09/30/14 0817 12/24/14 0816 02/22/15 1216 04/01/15 0851  WBC 6.5 5.4 8.4 5.3  NEUTROABS 4.2 3.4  --  3.4  HGB  --   --  12.4*  --   HCT 39.9 41.7 38.8* 41.2  MCV  --   --  89.0  --   PLT  --   --  322  --    Lipid Panel:  Recent Labs  09/30/14 0817 12/24/14 0816 04/01/15 0851  CHOL 222* 259* 252*  HDL 54 52 58  LDLCALC 154* 192* 181*  TRIG 72 74 64  CHOLHDL 4.1 5.0 4.3   Lab Results  Component Value Date   HGBA1C 6.0* 04/01/2015    Procedures: No results found.  Assessment/Plan 1. Benign prostatic hypertrophy (BPH) with weak urinary stream - ongoing complaints about stream and frequency at night so will try him on flomax - tamsulosin (FLOMAX) 0.4 MG CAPS capsule; Take 1 capsule (0.4 mg total) by mouth daily.  Dispense: 30 capsule; Refill: 3  2. Abnormal x-ray of lungs with single pulmonary nodule -CXR with 1.7cm right apex nodule 02/22/15 with hyperinflation--CT chest was recommended: - CT CHEST NODULE FOLLOW UP LOW DOSE W/O; Future -importance of following through with this emphasized -also will likely need pulmonary referral for PFTs--xray says COPD, but he does not truly have this diagnosis on record--did have long smoking history and tends to get very wheezy when he has an infection, but no difficulty with breathing noted at baseline and he's quite active -is very thin and cannot gain weight despite eating very fattening unhealthy foods and very little veggies  3. Hypercholesterolemia -cannot tolerate statins and could not afford fibrates or zetia and really doesn't want to take despite risks of not doing so -continue to monitor  4. Essential hypertension, benign -bp at goal at home but always slightly elevated here--anxious for visits/white coat syndrome  5. Constipation, slow transit -encouraged pt to eat more healthy veggies (as much as possible with missing bottom teeth) and drink more water--he agrees to do this  6. Hearing loss, bilateral -has  gotten hearing aides which are quite helpful  7. Need for influenza vaccination -flu shot given  Labs/tests ordered:   Orders Placed This Encounter  Procedures  . CT CHEST NODULE FOLLOW UP LOW DOSE W/O    Standing Status: Future     Number of Occurrences:      Standing Expiration Date: 06/02/2016    Order Specific Question:  Reason for Exam (SYMPTOM  OR DIAGNOSIS REQUIRED)    Answer:  weight loss, right apex nodular density seen on CXR 02/22/15    Order Specific Question:  Preferred imaging location?    Answer:  [facility]  . Flu Vaccine QUAD 36+ mos IM   Next appt:  07/06/2015 med mgt   Thomas Reyes, D.O. [facility] [address], [place] [address] Cell Phone (Mon-Fri 8am-5pm):  [phone] On Call:  [phone] & follow prompts after 5pm & weekends Office Phone:  [phone] Office Fax:  [phone]

## 2015-04-03 NOTE — Progress Notes (Signed)
Failed clock drawing  

## 2015-04-05 DIAGNOSIS — R3912 Poor urinary stream: Principal | ICD-10-CM

## 2015-04-05 DIAGNOSIS — R911 Solitary pulmonary nodule: Secondary | ICD-10-CM | POA: Insufficient documentation

## 2015-04-05 DIAGNOSIS — N401 Enlarged prostate with lower urinary tract symptoms: Secondary | ICD-10-CM | POA: Insufficient documentation

## 2015-04-15 ENCOUNTER — Ambulatory Visit
Admission: RE | Admit: 2015-04-15 | Discharge: 2015-04-15 | Disposition: A | Discharge status: Home / Self Care | Payer: Commercial Managed Care - HMO | Source: Ambulatory Visit | Attending: Internal Medicine | Admitting: Internal Medicine

## 2015-04-15 DIAGNOSIS — R911 Solitary pulmonary nodule: Secondary | ICD-10-CM | POA: Diagnosis not present

## 2015-04-16 ENCOUNTER — Other Ambulatory Visit: Payer: Self-pay | Admitting: *Deleted

## 2015-04-16 DIAGNOSIS — R911 Solitary pulmonary nodule: Secondary | ICD-10-CM

## 2015-05-06 ENCOUNTER — Ambulatory Visit (INDEPENDENT_AMBULATORY_CARE_PROVIDER_SITE_OTHER): Payer: Commercial Managed Care - HMO | Admitting: Internal Medicine

## 2015-05-06 ENCOUNTER — Encounter: Payer: Self-pay | Admitting: Internal Medicine

## 2015-05-06 VITALS — BP 122/62 | HR 54 | Ht 67.0 in | Wt 132.2 lb

## 2015-05-06 DIAGNOSIS — R911 Solitary pulmonary nodule: Secondary | ICD-10-CM

## 2015-05-06 DIAGNOSIS — I10 Essential (primary) hypertension: Secondary | ICD-10-CM

## 2015-05-06 MED ORDER — VALSARTAN 80 MG PO TABS: 80.0000 mg | ORAL_TABLET | Freq: Every day | ORAL | Status: DC

## 2015-05-06 NOTE — Patient Instructions (Addendum)
Stop lisinopril and start diovan (valsartan ) 80 mg daily and your tendency to bad cough/gagging should resolve  Please schedule a follow up office visit in  2 months sooner if needed to discuss doing follow up CT chest

## 2015-05-06 NOTE — Progress Notes (Signed)
Subjective:    Patient ID: Thomas Reyes, male    DOB: 26-May-1941,    MRN: [id number]  HPI   70 yowm  Quit smoking 2012 with sob that seemed to get better with rec to use inhalers and did fine without them until one week prior to ER 918/16  with sob >> cough > abn cxr in ER > rx with abx x 7days > CT chest 04/15/15 c/w scar but f/u rec so referred to pulmonary clinic by Dr Hollace Kinnier   05/06/2015 1st [facility] office visit/ Thomas Reyes   Chief Complaint  Patient presents with  . PULMONARY CONSULT    Referred by Dr. Hollace Kinnier for lung nodules. Pt c/o occasional dyspnea with exertion, more in hot/humid weather. Pt denies cough/wheeze.   apparently at one point the cough was so severe he felt he was choking but better now and just c/o doe = MMRC1 = can walk nl pace, flat grade, can't hurry or go uphills or steps s sob    No obvious other patterns in day to day or daytime variabilty or assoc excess or purulent sputum or cp or chest tightness, subjective wheeze overt sinus or hb symptoms. No unusual exp hx or h/o childhood pna/ asthma or knowledge of premature birth.  Sleeping ok without nocturnal  or early am exacerbation  of respiratory  c/o's or need for noct saba. Also denies any obvious fluctuation of symptoms with weather or environmental changes or other aggravating or alleviating factors except as outlined above   Current Medications, Allergies, Complete Past Medical History, Past Surgical History, Family History, and Social History were reviewed in [employer] record.           Review of Systems  Constitutional: Negative.  Negative for fever and unexpected weight change.  HENT: Negative.  Negative for congestion, dental problem, ear pain, nosebleeds, postnasal drip, rhinorrhea, sinus pressure, sneezing, sore throat and trouble swallowing.   Eyes: Negative.  Negative for redness and itching.  Respiratory: Positive for shortness of breath. Negative for  cough, chest tightness and wheezing.   Cardiovascular: Negative.  Negative for palpitations and leg swelling.  Gastrointestinal: Negative.  Negative for nausea and vomiting.  Endocrine: Negative.   Genitourinary: Negative.  Negative for dysuria.  Musculoskeletal: Negative.  Negative for joint swelling.  Skin: Negative.  Negative for rash.  Allergic/Immunologic: Negative.   Neurological: Negative.  Negative for headaches.  Hematological: Bruises/bleeds easily.  Psychiatric/Behavioral: Negative.  Negative for dysphoric mood. The patient is not nervous/anxious.        Objective:   Physical Exam  amb hoarse wm mild hoarseness   Wt Readings from Last 3 Encounters:  05/06/15 132 lb 3.2 oz (59.966 kg)  04/03/15 129 lb 12.8 oz (58.877 kg)  12/26/14 134 lb 3.2 oz (60.873 kg)    Vital signs reviewed   HEENT: nl dentition, turbinates, and oropharynx. Nl external ear canals without cough reflex   NECK :  without JVD/Nodes/TM/ nl carotid upstrokes bilaterally   LUNGS: no acc muscle use,  Nl contour chest which is clear to A and P bilaterally without cough on insp or exp maneuvers   CV:  RRR  no s3 or murmur or increase in P2, no edema   ABD:  soft and nontender with nl inspiratory excursion in the supine position. No bruits or organomegaly, bowel sounds nl  MS:  Nl gait/ ext warm without deformities, calf tenderness, cyanosis or clubbing No obvious joint restrictions  SKIN: warm and dry without lesions    NEURO:  alert, approp, nl sensorium with  no motor deficits     I personally reviewed images and agree with radiology impression as follows:  CT Chest   04/15/15  1. The focal opacity noted at the right apex on current chest radiograph is most likely due to pleural parenchymal scarring. Neoplastic disease, however, is not excluded. Recommend followup unenhanced chest CT in 3 months to assess for stability. 2. The multiple small pulmonary nodules as described.       Assessment & Plan:

## 2015-05-07 ENCOUNTER — Encounter: Payer: Self-pay | Admitting: Internal Medicine

## 2015-05-07 NOTE — Assessment & Plan Note (Signed)
-   See CT 04/15/15 > for f/u 07/15/14 (placed in tickle file )  CT results reviewed with pt >>> Too small for PET or bx, not suspicious enough for excisional bx > really only option for now is follow the Fleischner society guidelines as rec by radiology.  He's concerned about the cost of repeat ct but it's really the most cost effective procedure of the options available other than cxr, which in a surgical candidate would not be sensitive enough to pick up subtle growth if this does turn out to be a neoplasm  Discussed in detail all the  indications, usual  risks and alternatives  relative to the benefits with patient who agrees to proceed with conservative f/u as outlined    Total time devoted to counseling  = 35/7m review case with pt/wife  discussion of options/alternatives/ giving and going over instructions (see avs)

## 2015-05-07 NOTE — Assessment & Plan Note (Signed)
Trial off acei 05/06/2015 due to severe cough hx and ongoing doe  In the best review of chronic cough to date ( NEJM 2016 375 [id number]) ,  ACEi are now felt to cause cough in up to  20% of pts which is a 4 fold increase from previous reports and does not include the variety of non-specific complaints we see in pulmonary clinic in pts on ACEi but previously attributed to copd/asthma to include PNDS, throat and chest congestion, "bronchitis", unexplained dyspnea or h/o cough to point of choking /  noct "strangling" sensations as well as atypical /refractory GERD symptoms like atypical dysphagia.   The only way to prove this is not an "ACEi Case" is a trial off ACEi x a minimum of 6 weeks then regroup.   Try diovan 80 mg daily until returns for f/u CT to see what difference if any this makes in his symptoms.

## 2015-06-25 ENCOUNTER — Encounter: Payer: Self-pay | Admitting: Internal Medicine

## 2015-06-25 ENCOUNTER — Ambulatory Visit (INDEPENDENT_AMBULATORY_CARE_PROVIDER_SITE_OTHER): Payer: Medicare HMO | Admitting: Internal Medicine

## 2015-06-25 VITALS — BP 120/68 | HR 60 | Temp 97.5°F | Resp 20 | Ht 67.0 in | Wt 133.4 lb

## 2015-06-25 DIAGNOSIS — E7800 Pure hypercholesterolemia, unspecified: Secondary | ICD-10-CM

## 2015-06-25 DIAGNOSIS — R911 Solitary pulmonary nodule: Secondary | ICD-10-CM

## 2015-06-25 DIAGNOSIS — N401 Enlarged prostate with lower urinary tract symptoms: Secondary | ICD-10-CM | POA: Diagnosis not present

## 2015-06-25 DIAGNOSIS — L209 Atopic dermatitis, unspecified: Secondary | ICD-10-CM

## 2015-06-25 DIAGNOSIS — I10 Essential (primary) hypertension: Secondary | ICD-10-CM

## 2015-06-25 DIAGNOSIS — R3912 Poor urinary stream: Secondary | ICD-10-CM | POA: Diagnosis not present

## 2015-06-25 NOTE — Progress Notes (Signed)
Patient ID: Thomas Reyes, male   DOB: [birth year], 75 y.o.   MRN: [id number]   Location: [facility] Provider: Rexene Edison. Mariea Clonts, D.O., C.M.D.  Goals of Care: Advanced Directive information Does patient have an advance directive?: Yes, Type of Advance Directive: Living will, Does patient want to make changes to advanced directive?: No - Patient declined  Chief Complaint  Patient presents with  . Medical Management of Chronic Issues    3 month follow-up for Hypercholesterolemia, Hypertension    HPI: Patient is a 75 y.o. male seen in the office today for med mgt of chronic diseases including htn, hyperlipidemia, bph, pulmonary nodule.    Had a sore place that was extremely tender place on his scrotum.  Went away after 3 days.  Had been very itchy.  Extremely itchy even still. Then has flaky skin coming off after that.  Very red.  Has atopic dermatitis.    BPH:  Unchanged.  Doing ok lately--using flomax and it's helped.  Pulmonary nodule:  Went to lung doctor.  He thought it was scar tissue.  He is to f/u with him again and get a repeat CT.  Reviewed Dr. Gustavus Bryant note.  Cough:  Is much better off ace inhibitor.  BP well controlled with diovan instead.    HTN:  bp actually normal today with diovan instead of ace  Hyperlipidemia:  On pravachol for this, but as not been taking--says he gets cramps with it also.  Zetia is too expensive so not taking it either.  Needs new referrals he says to pulmonary, ophthalmology and dermatology.  Unsure if all of these are truly needed this soon--referral coordinator was determining and to let me know.  Review of Systems:  Review of Systems  Constitutional: Negative for fever and chills.  HENT: Negative for congestion.   Eyes: Negative for blurred vision.  Respiratory: Negative for cough and shortness of breath.        Lung nodule possibly scar tissue; off ace and cough better  Cardiovascular: Negative for chest pain, palpitations and leg  swelling.  Genitourinary: Negative for dysuria, urgency and frequency.       Stream improved, less nocturia with flomax  Musculoskeletal: Positive for myalgias. Negative for falls.       With pravachol  Skin: Positive for itching and rash.       Dry scaly patches of skin  Neurological: Negative for dizziness and loss of consciousness.  Endo/Heme/Allergies: Does not bruise/bleed easily.  Psychiatric/Behavioral: Negative for depression and memory loss.    Past Medical History  Diagnosis Date  . Hypercholesterolemia   . Edema of male genital organs 08/20/2012  . Chest pain, unspecified 08/20/2012  . Nocturia 08/20/2012  . 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 Chest pain, unspecified 12/26/2011  . Diverticulosis of colon (without mention of hemorrhage) 06/14/2010  . Elevated blood pressure reading without diagnosis of hypertension 06/14/2010  . Unspecified constipation 05/11/2009  . Urinary tract infection, site not specified 05/11/2009  . Lumbago 05/11/2009  . Carpal tunnel syndrome 03/16/2009  . Encounter for long-term (current) use of other medications   . 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 Corns and callosities 04/11/2007  . Pain in joint, ankle and foot 04/11/2007  . Ulcer of other part of foot 03/16/2007  . Allergic rhinitis, cause unspecified 09/08/2006  . Pterygium, unspecified 03/01/2006  . Other and unspecified hyperlipidemia 02/24/2006  . COPD (chronic obstructive pulmonary disease) ([place]) 02/24/2006  .  Hypertrophy of prostate without urinary obstruction and other lower urinary tract symptoms (LUTS)   . Dermatophytosis of the body   . Tobacco use disorder   . Disturbance of skin sensation   . Ventral hernia, unspecified, without mention of obstruction or gangrene   . Seborrheic dermatitis, unspecified 03/21/2001  . Rash and other nonspecific skin eruption 12/28/1999  . Concussion, unspecified 10/30/1994    Past  Surgical History  Procedure Laterality Date  . Elbow surgery Right   . Knee surgery Right 01/2007    knee cap injury  . Tonsillectomy  1948  . Fracture surgery  1986    right hip, right elbow  . Skin cancer excision  07/2014    Back and head     Allergies  Allergen Reactions  . Zocor [Simvastatin] Shortness Of Breath    Sob and itching  . Lipitor [Atorvastatin]     Muscle and bone pain but tolerated pravachol      Medication List       This list is accurate as of: 06/25/15 11:46 AM.  Always use your most recent med list.               aspirin 81 MG tablet  Take 81 mg by mouth daily. Take one tablet once a day     ibuprofen 200 MG tablet  Commonly known as:  ADVIL,MOTRIN  Take 400 mg by mouth every 4 (four) hours as needed for fever, headache, mild pain, moderate pain or cramping.     tamsulosin 0.4 MG Caps capsule  Commonly known as:  FLOMAX  Take 1 capsule (0.4 mg total) by mouth daily.     valsartan 80 MG tablet  Commonly known as:  DIOVAN  Take 1 tablet (80 mg total) by mouth daily.        Health Maintenance  Topic Date Due  . ZOSTAVAX  05/14/2000  . INFLUENZA VACCINE  01/05/2016  . COLONOSCOPY  04/16/2023  . TETANUS/TDAP  12/04/2023  . PNA vac Low Risk Adult  Completed    Physical Exam: Filed Vitals:   06/25/15 1030  BP: 120/68  Pulse: 60  Temp: 97.5 F (36.4 C)  TempSrc: Oral  Resp: 20  Height: 5\' 7"  (1.702 m)  Weight: 133 lb 6.4 oz (60.51 kg)  SpO2: 97%   Body mass index is 20.89 kg/(m^2). Physical Exam  Constitutional: No distress.  Thin white male  Cardiovascular: Normal rate, regular rhythm, normal heart sounds and intact distal pulses.   Pulmonary/Chest: Effort normal and breath sounds normal. No respiratory distress.  Abdominal: Soft. Bowel sounds are normal. He exhibits no distension and no mass. There is no tenderness.  Musculoskeletal: Normal range of motion.  Skin: Skin is warm and dry.  Some rhinophyma, a couple of dry scaly  patches of skin  Psychiatric: He has a normal mood and affect.    Labs reviewed: Basic Metabolic Panel:  Recent Labs  12/24/14 0816 02/22/15 1216 04/01/15 0851  NA 136 137 139  K 4.9 4.6 4.8  CL 98 104 99  CO2 24 25 24   GLUCOSE 86 101* 92  BUN 14 13 12   CREATININE 1.19 1.23 1.27  CALCIUM 8.9 9.0 9.1   Liver Function Tests:  Recent Labs  09/30/14 0817 12/24/14 0816  AST 16 13  ALT 6 7  ALKPHOS 54 57  BILITOT 0.4 0.3  PROT 6.3 6.5  ALBUMIN 3.8 4.0   No results for input(s): LIPASE, AMYLASE in the last 8760 hours. No  results for input(s): AMMONIA in the last 8760 hours. CBC:  Recent Labs  09/30/14 0817 12/24/14 0816 02/22/15 1216 04/01/15 0851  WBC 6.5 5.4 8.4 5.3  NEUTROABS 4.2 3.4  --  3.4  HGB  --   --  12.4*  --   HCT 39.9 41.7 38.8* 41.2  MCV 83 83 89.0 88  PLT  --  237 322 271   Lipid Panel:  Recent Labs  09/30/14 0817 12/24/14 0816 04/01/15 0851  CHOL 222* 259* 252*  HDL 54 52 58  LDLCALC 154* 192* 181*  TRIG 72 74 64  CHOLHDL 4.1 5.0 4.3   Lab Results  Component Value Date   HGBA1C 6.0* 04/01/2015   Assessment/Plan 1. Atopic eczema -low dose steroid cream renewed  2. Benign prostatic hypertrophy (BPH) with weak urinary stream -cont flomax which has been helping  3. Abnormal x-ray of lungs with single pulmonary nodule -f/u with Dr. Melvyn Novas for1/30 and have f/u imaging as planned after that  4. Essential hypertension, benign -bp at goal now with diovan in place of ace  5. Hypercholesterolemia -cannot tolerate statin therapy and zetia and fibrates are too expensive plus pt really does not want to take anything anyway -if levels cont to climb, will refer to lipid clinic to try to get him an injectable med  6.  Cough: -resolved with stopping ace and starting diovan  Labs/tests ordered:  No orders of the defined types were placed in this encounter.    Next appt:  4 mos for med mgt  Ashir Kunz L. Ileah Falkenstein, D.O. [facility] [address],  [address] Cell Phone (Mon-Fri 8am-5pm):  [phone] On Call:  [phone] & follow prompts after 5pm & weekends Office Phone:  [phone] Office Fax:  [phone]

## 2015-06-25 NOTE — Patient Instructions (Signed)
Restart cream to groin area where skin is very dry--use for a week or two until skin gets more moist, then discontinue and use as needed for dry skin.

## 2015-06-26 ENCOUNTER — Other Ambulatory Visit: Payer: Self-pay | Admitting: Internal Medicine

## 2015-06-26 DIAGNOSIS — R911 Solitary pulmonary nodule: Secondary | ICD-10-CM

## 2015-07-04 ENCOUNTER — Other Ambulatory Visit: Payer: Self-pay | Admitting: Internal Medicine

## 2015-07-06 ENCOUNTER — Ambulatory Visit: Payer: Commercial Managed Care - HMO | Admitting: Internal Medicine

## 2015-07-06 ENCOUNTER — Ambulatory Visit (INDEPENDENT_AMBULATORY_CARE_PROVIDER_SITE_OTHER): Payer: Commercial Managed Care - HMO | Admitting: Internal Medicine

## 2015-07-06 ENCOUNTER — Encounter: Payer: Self-pay | Admitting: Internal Medicine

## 2015-07-06 VITALS — BP 112/70 | HR 55 | Ht 67.0 in | Wt 134.4 lb

## 2015-07-06 DIAGNOSIS — J449 Chronic obstructive pulmonary disease, unspecified: Secondary | ICD-10-CM

## 2015-07-06 DIAGNOSIS — I10 Essential (primary) hypertension: Secondary | ICD-10-CM

## 2015-07-06 DIAGNOSIS — R911 Solitary pulmonary nodule: Secondary | ICD-10-CM | POA: Diagnosis not present

## 2015-07-06 NOTE — Progress Notes (Signed)
Subjective:    Patient ID: Thomas Reyes, male    DOB: 11/19/1940     MRN: [id number]    Brief patient profile:   38 yowm  Quit smoking 2012 with sob that seemed to get better with rec to use prn  inhalers and did fine without them until one week prior to ER 918/16  with sob >> cough > abn cxr in ER > rx with abx x 7days > CT chest 04/15/15 c/w scar but follow up on the ct rec  rec so referred to pulmonary clinic 05/06/15  by Dr Hollace Kinnier for eval of persistent sob     History of Present Illness  05/06/2015 1st [facility] office visit/ Raileigh Sabater   Chief Complaint  Patient presents with  . PULMONARY CONSULT    Referred by Dr. Hollace Kinnier for lung nodules. Pt c/o occasional dyspnea with exertion, more in hot/humid weather. Pt denies cough/wheeze.   apparently at one point the cough was so severe he felt he was choking but better now and just c/o doe = MMRC1 = can walk nl pace, flat grade, can't hurry or go uphills or steps s sob   rec Stop lisinopril and start diovan (valsartan ) 80 mg daily and your tendency to bad cough/gagging should resolve Please schedule a follow up office visit in  2 months sooner if needed to discuss doing follow up CT chest      07/06/2015  f/u ov/Yenty Bloch re: GOLD II copd/  spn/ no inhalers at all   Chief Complaint  Patient presents with  . Follow-up    Pt states coughing much less since last visit. Breathing is doing well.      Not limited by breathing from desired activities  / no need for saba  No obvious day to day or daytime variability or assoc chronic cough or cp or chest tightness, subjective wheeze or overt sinus or hb symptoms. No unusual exp hx or h/o childhood pna/ asthma or knowledge of premature birth.  Sleeping ok without nocturnal  or early am exacerbation  of respiratory  c/o's or need for noct saba. Also denies any obvious fluctuation of symptoms with weather or environmental changes or other aggravating or alleviating factors except  as outlined above   Current Medications, Allergies, Complete Past Medical History, Past Surgical History, Family History, and Social History were reviewed in [employer] record.  ROS  The following are not active complaints unless bolded sore throat, dysphagia, dental problems, itching, sneezing,  nasal congestion or excess/ purulent secretions, ear ache,   fever, chills, sweats, unintended wt loss, classically pleuritic or exertional cp, hemoptysis,  orthopnea pnd or leg swelling, presyncope, palpitations, abdominal pain, anorexia, nausea, vomiting, diarrhea  or change in bowel or bladder habits, change in stools or urine, dysuria,hematuria,  rash, arthralgias, visual complaints, headache, numbness, weakness or ataxia or problems with walking or coordination,  change in mood/affect or memory.                       Objective:   Physical Exam  amb  wm no longer  Hoarse / all smiles   07/06/2015       134   05/06/15 132 lb 3.2 oz (59.966 kg)  04/03/15 129 lb 12.8 oz (58.877 kg)  12/26/14 134 lb 3.2 oz (60.873 kg)    Vital signs reviewed   HEENT: nl dentition, turbinates, and oropharynx. Nl external ear canals without cough reflex  NECK :  without JVD/Nodes/TM/ nl carotid upstrokes bilaterally   LUNGS: no acc muscle use,  Nl contour chest which is clear to A and P bilaterally without cough on insp or exp maneuvers   CV:  RRR  no s3 or murmur or increase in P2, no edema   ABD:  soft and nontender with nl inspiratory excursion in the supine position. No bruits or organomegaly, bowel sounds nl  MS:  Nl gait/ ext warm without deformities, calf tenderness, cyanosis or clubbing No obvious joint restrictions   SKIN: warm and dry without lesions    NEURO:  alert, approp, nl sensorium with  no motor deficits     I personally reviewed images and agree with radiology impression as follows:  CT Chest   04/15/15  1. The focal opacity noted at the right apex on  current chest radiograph is most likely due to pleural parenchymal scarring. Neoplastic disease, however, is not excluded. Recommend followup unenhanced chest CT in 3 months to assess for stability. 2. The multiple small pulmonary nodules as described.     Assessment & Plan:

## 2015-07-06 NOTE — Patient Instructions (Signed)
Keep your appt for CT chest and we will call you with results

## 2015-07-08 ENCOUNTER — Encounter: Payer: Self-pay | Admitting: Internal Medicine

## 2015-07-08 DIAGNOSIS — J449 Chronic obstructive pulmonary disease, unspecified: Secondary | ICD-10-CM | POA: Insufficient documentation

## 2015-07-08 NOTE — Assessment & Plan Note (Addendum)
Spirometry 07/06/2015  FEV1 1.77 (58%)  Ratio 65 s rx at all   As I explained to this patient in detail:  although there is mild  copd present, it may not be clinically relevant:   it does not appear to be limiting activity tolerance any more than a set of worn tires limits someone from driving a car  around a parking lot.  A new set of Michelins might look good but would have no perceived impact on the performance of the car and would not be worth the cost.  That is to say:   this pt is not limited by sob from desired activities so  I don't recommend aggressive pulmonary rx at this point unless limiting symptoms arise or acute exacerbations become as issue, neither of which is the case now.  I asked the patient to contact this office at any time in the future should either of these problems arise.    I had an extended final summary discussion with the patient reviewing all relevant studies completed to date and  lasting 15 to 20 minutes of a 25 minute visit on the following issues:    Each maintenance medication was reviewed in detail including most importantly the difference between maintenance and as needed and under what circumstances the prns are to be used.  Please see instructions for details which were reviewed in writing and the patient given a copy.    F/u is prn

## 2015-07-08 NOTE — Assessment & Plan Note (Signed)
-   See CT 04/15/15 > for f/u 07/16/15 (placed in tickle file )  Discussed in detail all the  indications, usual  risks and alternatives  relative to the benefits with patient who agrees to proceed with conservative f/u as outlined

## 2015-07-08 NOTE — Assessment & Plan Note (Signed)
Trial off acei 05/06/2015 due to severe cough hx and ongoing doe> resolved to his satisfaction 07/08/2015 s need for any pulmonary rx   Although even in retrospect it may not be clear the ACEi contributed to the pt's symptoms,  Pt improved off them and adding them back at this point or in the future would risk confusion in interpretation of non-specific respiratory symptoms to which this patient is prone  ie  Better not to muddy the waters here.   Adequate control on present rx, reviewed > no change in rx needed  >  Follow up per Primary Care planned

## 2015-07-16 ENCOUNTER — Ambulatory Visit (INDEPENDENT_AMBULATORY_CARE_PROVIDER_SITE_OTHER)
Admission: RE | Admit: 2015-07-16 | Discharge: 2015-07-16 | Disposition: A | Discharge status: Home / Self Care | Payer: Commercial Managed Care - HMO | Source: Ambulatory Visit | Attending: Internal Medicine | Admitting: Internal Medicine

## 2015-07-16 DIAGNOSIS — R911 Solitary pulmonary nodule: Secondary | ICD-10-CM | POA: Diagnosis not present

## 2015-07-16 NOTE — Progress Notes (Signed)
Quick Note:  Spoke with pt and notified of results per Dr. Wert. Pt verbalized understanding and denied any questions.  ______ 

## 2015-07-24 ENCOUNTER — Emergency Department (INDEPENDENT_AMBULATORY_CARE_PROVIDER_SITE_OTHER): Payer: Commercial Managed Care - HMO

## 2015-07-24 ENCOUNTER — Emergency Department (HOSPITAL_COMMUNITY)
Admission: EM | Admit: 2015-07-24 | Discharge: 2015-07-24 | Disposition: A | Discharge status: Home / Self Care | Payer: Commercial Managed Care - HMO | Source: Home / Self Care | Attending: Family Medicine | Admitting: Family Medicine

## 2015-07-24 ENCOUNTER — Encounter (HOSPITAL_COMMUNITY): Payer: Self-pay

## 2015-07-24 DIAGNOSIS — R05 Cough: Secondary | ICD-10-CM | POA: Diagnosis not present

## 2015-07-24 DIAGNOSIS — J440 Chronic obstructive pulmonary disease with acute lower respiratory infection: Secondary | ICD-10-CM

## 2015-07-24 DIAGNOSIS — J209 Acute bronchitis, unspecified: Secondary | ICD-10-CM

## 2015-07-24 DIAGNOSIS — R509 Fever, unspecified: Secondary | ICD-10-CM | POA: Diagnosis not present

## 2015-07-24 MED ORDER — IPRATROPIUM-ALBUTEROL 0.5-2.5 (3) MG/3ML IN SOLN: 3.0000 mL | Freq: Once | RESPIRATORY_TRACT | Status: DC

## 2015-07-24 MED ORDER — IPRATROPIUM-ALBUTEROL 0.5-2.5 (3) MG/3ML IN SOLN
RESPIRATORY_TRACT | Status: AC
  Filled 2015-07-24: qty 3

## 2015-07-24 MED ORDER — AZITHROMYCIN 250 MG PO TABS: ORAL_TABLET | ORAL | Status: DC

## 2015-07-24 MED ORDER — PREDNISONE 10 MG PO TABS: ORAL_TABLET | ORAL | Status: DC

## 2015-07-24 MED ORDER — ALBUTEROL SULFATE HFA 108 (90 BASE) MCG/ACT IN AERS: 2.0000 | INHALATION_SPRAY | RESPIRATORY_TRACT | Status: DC | PRN

## 2015-07-24 NOTE — ED Notes (Signed)
Patient is having flu-like symptoms with back and stomach pains Symptoms x4 days Chest congestions

## 2015-07-24 NOTE — ED Provider Notes (Signed)
CSN: [id number]     Arrival date & time 07/24/15  1310 History   First MD Initiated Contact with Patient 07/24/15 1435     No chief complaint on file.  (Consider location/radiation/quality/duration/timing/severity/associated sxs/prior Treatment) HPI Cough wheezing, sob for a few days. No home treatment helping. Ex smoker, mild COPD, recent chest CT for nodule. No fever, diarrhea, vomiting.  Past Medical History  Diagnosis Date  . Hypercholesterolemia   . Edema of male genital organs 08/20/2012  . Chest pain, unspecified 08/20/2012  . Nocturia 08/20/2012  . 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 Chest pain, unspecified 12/26/2011  . Diverticulosis of colon (without mention of hemorrhage) 06/14/2010  . Elevated blood pressure reading without diagnosis of hypertension 06/14/2010  . Unspecified constipation 05/11/2009  . Urinary tract infection, site not specified 05/11/2009  . Lumbago 05/11/2009  . Carpal tunnel syndrome 03/16/2009  . Encounter for long-term (current) use of other medications   . 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 Corns and callosities 04/11/2007  . Pain in joint, ankle and foot 04/11/2007  . Ulcer of other part of foot 03/16/2007  . Allergic rhinitis, cause unspecified 09/08/2006  . Pterygium, unspecified 03/01/2006  . Other and unspecified hyperlipidemia 02/24/2006  . COPD (chronic obstructive pulmonary disease) ([place]) 02/24/2006  . Hypertrophy of prostate without urinary obstruction and other lower urinary tract symptoms (LUTS)   . Dermatophytosis of the body   . Tobacco use disorder   . Disturbance of skin sensation   . Ventral hernia, unspecified, without mention of obstruction or gangrene   . Seborrheic dermatitis, unspecified 03/21/2001  . Rash and other nonspecific skin eruption 12/28/1999  . Concussion, unspecified 10/30/1994   Past Surgical History  Procedure Laterality Date  . Elbow surgery Right    . Knee surgery Right 01/2007    knee cap injury  . Tonsillectomy  1948  . Fracture surgery  1986    right hip, right elbow  . Skin cancer excision  07/2014    Back and head    Family History  Problem Relation Age of Onset  . Diabetes Mother   . Hypertension Mother   . Cancer Father    Social History  Substance Use Topics  . Smoking status: Former Smoker    Quit date: 06/09/2010  . Smokeless tobacco: Never Used  . Alcohol Use: No    Review of Systems See HPI Allergies  Zocor; Ace inhibitors; Lipitor; and Pravachol  Home Medications   Prior to Admission medications   Medication Sig Start Date End Date Taking? Authorizing Provider  aspirin 81 MG tablet Take 81 mg by mouth daily. Take one tablet once a day    Historical Provider, MD  ibuprofen (ADVIL,MOTRIN) 200 MG tablet Take 400 mg by mouth every 4 (four) hours as needed for fever, headache, mild pain, moderate pain or cramping.    Historical Provider, MD  tamsulosin (FLOMAX) 0.4 MG CAPS capsule TAKE 1 CAPSULE EVERY DAY 07/06/15   Tiffany L Reed, DO  valsartan (DIOVAN) 80 MG tablet Take 1 tablet (80 mg total) by mouth daily. 05/06/15 05/05/16  Tanda Rockers, MD   Meds Ordered and Administered this Visit  Medications - No data to display  BP 153/67 mmHg  Pulse 100  Temp(Src) 98 F (36.7 C) (Oral)  Resp 16  SpO2 98% No data found.   Physical Exam  Constitutional: He is oriented to person, place, and time. He appears well-developed and  well-nourished.  HENT:  Head: Normocephalic and atraumatic.  Eyes: Conjunctivae are normal.  Neck: Normal range of motion. Neck supple.  Cardiovascular: Normal rate.   Pulmonary/Chest: Effort normal. No respiratory distress. He has wheezes.  Abdominal: Soft.  Musculoskeletal: Normal range of motion.  Neurological: He is alert and oriented to person, place, and time.  Skin: Skin is warm and dry.  Psychiatric: He has a normal mood and affect. His behavior is normal.  Nursing note  and vitals reviewed.   ED Course  Procedures (including critical care time)  Labs Review Labs Reviewed - No data to display  Imaging Review Dg Chest 2 View  07/24/2015  CLINICAL DATA:  Per pt: sick for four days, cough, congestion, sore throat, back ache, low grade fever. No history of cardiac disease. History of pneumonia. Non-smoker for five years. Patient is not a diabetic EXAM: CHEST  2 VIEW COMPARISON:  02/22/2015 FINDINGS: The cardiac silhouette is normal in size and configuration. No mediastinal or hilar masses or evidence of adenopathy. Lungs are hyperexpanded. There is stable pleural parenchymal scarring at apices, right greater than left. Lungs are otherwise clear. No pleural effusion or pneumothorax. The skeletal structures are demineralized but otherwise unremarkable. IMPRESSION: 1. No acute cardiopulmonary disease. 2. COPD. Electronically Signed   By: Lajean Manes M.D.   On: 07/24/2015 14:54     Visual Acuity Review  Right Eye Distance:   Left Eye Distance:   Bilateral Distance:    Right Eye Near:   Left Eye Near:    Bilateral Near:        Discussed review of x-ray with patient. No infiltrates mild hyperaeration and stable nodule. Patient states that he did feel a lot better after albuterol dual neb. MDM   1. COPD with acute bronchitis ([place])    Patient is advised to continue home symptomatic treatment. Prescription for prednisone and albuterol amoxicillin sent pharmacy patient has indicated. Patient is advised that if there are new or worsening symptoms or attend the emergency department, or contact primary care provider. Instructions of care provided discharged home in stable condition. Return to work/school note provided.  THIS NOTE WAS GENERATED USING A VOICE RECOGNITION SOFTWARE PROGRAM. ALL REASONABLE EFFORTS  WERE MADE TO PROOFREAD THIS DOCUMENT FOR ACCURACY.     Konrad Felix, [place] 07/24/15 1733

## 2015-07-24 NOTE — Discharge Instructions (Signed)
Asthma, Acute Bronchospasm °Acute bronchospasm caused by asthma is also referred to as an asthma attack. Bronchospasm means your air passages become narrowed. The narrowing is caused by inflammation and tightening of the muscles in the air tubes (bronchi) in your lungs. This can make it hard to breathe or cause you to wheeze and cough. °CAUSES °Possible triggers are: °· Animal dander from the skin, hair, or feathers of animals. °· Dust mites contained in house dust. °· Cockroaches. °· Pollen from trees or grass. °· Mold. °· Cigarette or tobacco smoke. °· Air pollutants such as dust, household cleaners, hair sprays, aerosol sprays, paint fumes, strong chemicals, or strong odors. °· Cold air or weather changes. Cold air may trigger inflammation. Winds increase molds and pollens in the air. °· Strong emotions such as crying or laughing hard. °· Stress. °· Certain medicines such as aspirin or beta-blockers. °· Sulfites in foods and drinks, such as dried fruits and wine. °· Infections or inflammatory conditions, such as a flu, cold, or inflammation of the nasal membranes (rhinitis). °· Gastroesophageal reflux disease (GERD). GERD is a condition where stomach acid backs up into your esophagus. °· Exercise or strenuous activity. °SIGNS AND SYMPTOMS  °· Wheezing. °· Excessive coughing, particularly at night. °· Chest tightness. °· Shortness of breath. °DIAGNOSIS  °Your health care provider will ask you about your medical history and perform a physical exam. A chest X-ray or blood testing may be performed to look for other causes of your symptoms or other conditions that may have triggered your asthma attack.  °TREATMENT  °Treatment is aimed at reducing inflammation and opening up the airways in your lungs.  Most asthma attacks are treated with inhaled medicines. These include quick relief or rescue medicines (such as bronchodilators) and controller medicines (such as inhaled corticosteroids). These medicines are sometimes  given through an inhaler or a nebulizer. Systemic steroid medicine taken by mouth or given through an IV tube also can be used to reduce the inflammation when an attack is moderate or severe. Antibiotic medicines are only used if a bacterial infection is present.  °HOME CARE INSTRUCTIONS  °· Rest. °· Drink plenty of liquids. This helps the mucus to remain thin and be easily coughed up. Only use caffeine in moderation and do not use alcohol until you have recovered from your illness. °· Do not smoke. Avoid being exposed to secondhand smoke. °· You play a critical role in keeping yourself in good health. Avoid exposure to things that cause you to wheeze or to have breathing problems. °· Keep your medicines up-to-date and available. Carefully follow your health care provider's treatment plan. °· Take your medicine exactly as prescribed. °· When pollen or pollution is bad, keep windows closed and use an air conditioner or go to places with air conditioning. °· Asthma requires careful medical care. See your health care provider for a follow-up as advised. If you are more than [redacted] weeks pregnant and you were prescribed any new medicines, let your obstetrician know about the visit and how you are doing. Follow up with your health care provider as directed. °· After you have recovered from your asthma attack, make an appointment with your outpatient doctor to talk about ways to reduce the likelihood of future attacks. If you do not have a doctor who manages your asthma, make an appointment with a primary care doctor to discuss your asthma. °SEEK IMMEDIATE MEDICAL CARE IF:  °· You are getting worse. °· You have trouble breathing. If severe, call your local   emergency services (911 in the [place]  You develop chest pain or discomfort.  You are vomiting.  You are not able to keep fluids down.  You are coughing up yellow, green, brown, or bloody sputum.  You have a fever and your symptoms suddenly get worse.  You have  trouble swallowing. MAKE SURE YOU:   Understand these instructions.  Will watch your condition.  Will get help right away if you are not doing well or get worse.   This information is not intended to replace advice given to you by your health care provider. Make sure you discuss any questions you have with your health care provider.   Document Released: 09/07/2006 Document Revised: 05/28/2013 Document Reviewed: 11/28/2012 Elsevier Interactive Patient Education 2016 [employer] Chronic Obstructive Pulmonary Disease Chronic obstructive pulmonary disease (COPD) is a common lung condition in which airflow from the lungs is limited. COPD is a general term that can be used to describe many different lung problems that limit airflow, including both chronic bronchitis and emphysema. If you have COPD, your lung function will probably never return to normal, but there are measures you can take to improve lung function and make yourself feel better. CAUSES   Smoking (common).  Exposure to secondhand smoke.  Genetic problems.  Chronic inflammatory lung diseases or recurrent infections. SYMPTOMS  Shortness of breath, especially with physical activity.  Deep, persistent (chronic) cough with a large amount of thick mucus.  Wheezing.  Rapid breaths (tachypnea).  Gray or bluish discoloration (cyanosis) of the skin, especially in your fingers, toes, or lips.  Fatigue.  Weight loss.  Frequent infections or episodes when breathing symptoms become much worse (exacerbations).  Chest tightness. DIAGNOSIS Your health care provider will take a medical history and perform a physical examination to diagnose COPD. Additional tests for COPD may include:  Lung (pulmonary) function tests.  Chest X-ray.  CT scan.  Blood tests. TREATMENT  Treatment for COPD may include:  Inhaler and nebulizer medicines. These help manage the symptoms of COPD and make your breathing more  comfortable.  Supplemental oxygen. Supplemental oxygen is only helpful if you have a low oxygen level in your blood.  Exercise and physical activity. These are beneficial for nearly all people with COPD.  Lung surgery or transplant.  Nutrition therapy to gain weight, if you are underweight.  Pulmonary rehabilitation. This may involve working with a team of health care providers and specialists, such as respiratory, occupational, and physical therapists. HOME CARE INSTRUCTIONS  Take all medicines (inhaled or pills) as directed by your health care provider.  Avoid over-the-counter medicines or cough syrups that dry up your airway (such as antihistamines) and slow down the elimination of secretions unless instructed otherwise by your health care provider.  If you are a smoker, the most important thing that you can do is stop smoking. Continuing to smoke will cause further lung damage and breathing trouble. Ask your health care provider for help with quitting smoking. He or she can direct you to community resources or hospitals that provide support.  Avoid exposure to irritants such as smoke, chemicals, and fumes that aggravate your breathing.  Use oxygen therapy and pulmonary rehabilitation if directed by your health care provider. If you require home oxygen therapy, ask your health care provider whether you should purchase a pulse oximeter to measure your oxygen level at home.  Avoid contact with individuals who have a contagious illness.  Avoid extreme temperature and humidity changes.  Eat healthy foods. Eating smaller,  more frequent meals and resting before meals may help you maintain your strength.  Stay active, but balance activity with periods of rest. Exercise and physical activity will help you maintain your ability to do things you want to do.  Preventing infection and hospitalization is very important when you have COPD. Make sure to receive all the vaccines your health care  provider recommends, especially the pneumococcal and influenza vaccines. Ask your health care provider whether you need a pneumonia vaccine.  Learn and use relaxation techniques to manage stress.  Learn and use controlled breathing techniques as directed by your health care provider. Controlled breathing techniques include:  Pursed lip breathing. Start by breathing in (inhaling) through your nose for 1 second. Then, purse your lips as if you were going to whistle and breathe out (exhale) through the pursed lips for 2 seconds.  Diaphragmatic breathing. Start by putting one hand on your abdomen just above your waist. Inhale slowly through your nose. The hand on your abdomen should move out. Then purse your lips and exhale slowly. You should be able to feel the hand on your abdomen moving in as you exhale.  Learn and use controlled coughing to clear mucus from your lungs. Controlled coughing is a series of short, progressive coughs. The steps of controlled coughing are: 1. Lean your head slightly forward. 2. Breathe in deeply using diaphragmatic breathing. 3. Try to hold your breath for 3 seconds. 4. Keep your mouth slightly open while coughing twice. 5. Spit any mucus out into a tissue. 6. Rest and repeat the steps once or twice as needed. SEEK MEDICAL CARE IF:  You are coughing up more mucus than usual.  There is a change in the color or thickness of your mucus.  Your breathing is more labored than usual.  Your breathing is faster than usual. SEEK IMMEDIATE MEDICAL CARE IF:  You have shortness of breath while you are resting.  You have shortness of breath that prevents you from:  Being able to talk.  Performing your usual physical activities.  You have chest pain lasting longer than 5 minutes.  Your skin color is more cyanotic than usual.  You measure low oxygen saturations for longer than 5 minutes with a pulse oximeter. MAKE SURE YOU:  Understand these  instructions.  Will watch your condition.  Will get help right away if you are not doing well or get worse.   This information is not intended to replace advice given to you by your health care provider. Make sure you discuss any questions you have with your health care provider.   Document Released: 03/02/2005 Document Revised: 06/13/2014 Document Reviewed: 01/17/2013 Elsevier Interactive Patient Education [employer].

## 2015-08-27 DIAGNOSIS — H353131 Nonexudative age-related macular degeneration, bilateral, early dry stage: Secondary | ICD-10-CM | POA: Diagnosis not present

## 2015-08-27 DIAGNOSIS — H04123 Dry eye syndrome of bilateral lacrimal glands: Secondary | ICD-10-CM | POA: Diagnosis not present

## 2015-08-27 DIAGNOSIS — H25813 Combined forms of age-related cataract, bilateral: Secondary | ICD-10-CM | POA: Diagnosis not present

## 2015-09-14 DIAGNOSIS — H21561 Pupillary abnormality, right eye: Secondary | ICD-10-CM | POA: Diagnosis not present

## 2015-09-14 DIAGNOSIS — H25811 Combined forms of age-related cataract, right eye: Secondary | ICD-10-CM | POA: Diagnosis not present

## 2015-09-14 DIAGNOSIS — H2511 Age-related nuclear cataract, right eye: Secondary | ICD-10-CM | POA: Diagnosis not present

## 2015-10-09 ENCOUNTER — Other Ambulatory Visit: Payer: Self-pay | Admitting: Internal Medicine

## 2015-10-13 DIAGNOSIS — H2512 Age-related nuclear cataract, left eye: Secondary | ICD-10-CM | POA: Diagnosis not present

## 2015-10-19 DIAGNOSIS — H5703 Miosis: Secondary | ICD-10-CM | POA: Diagnosis not present

## 2015-10-19 DIAGNOSIS — H21562 Pupillary abnormality, left eye: Secondary | ICD-10-CM | POA: Diagnosis not present

## 2015-10-19 DIAGNOSIS — H2512 Age-related nuclear cataract, left eye: Secondary | ICD-10-CM | POA: Diagnosis not present

## 2015-10-19 DIAGNOSIS — H25812 Combined forms of age-related cataract, left eye: Secondary | ICD-10-CM | POA: Diagnosis not present

## 2015-10-22 ENCOUNTER — Encounter: Payer: Self-pay | Admitting: Internal Medicine

## 2015-10-22 ENCOUNTER — Ambulatory Visit (INDEPENDENT_AMBULATORY_CARE_PROVIDER_SITE_OTHER): Payer: Commercial Managed Care - HMO | Admitting: Internal Medicine

## 2015-10-22 VITALS — BP 122/60 | HR 60 | Temp 97.3°F | Ht 67.0 in | Wt 137.0 lb

## 2015-10-22 DIAGNOSIS — R3912 Poor urinary stream: Secondary | ICD-10-CM

## 2015-10-22 DIAGNOSIS — L209 Atopic dermatitis, unspecified: Secondary | ICD-10-CM | POA: Diagnosis not present

## 2015-10-22 DIAGNOSIS — N401 Enlarged prostate with lower urinary tract symptoms: Secondary | ICD-10-CM

## 2015-10-22 DIAGNOSIS — K5901 Slow transit constipation: Secondary | ICD-10-CM | POA: Diagnosis not present

## 2015-10-22 DIAGNOSIS — R911 Solitary pulmonary nodule: Secondary | ICD-10-CM

## 2015-10-22 DIAGNOSIS — I10 Essential (primary) hypertension: Secondary | ICD-10-CM | POA: Diagnosis not present

## 2015-10-22 DIAGNOSIS — E7800 Pure hypercholesterolemia, unspecified: Secondary | ICD-10-CM

## 2015-10-22 NOTE — Progress Notes (Signed)
Location:  [facility] clinic Provider:  Min Collymore L. Mariea Clonts, D.O., C.M.D.  Code Status: full code Goals of Care:  Advanced Directives 10/22/2015  Does patient have an advance directive? Yes  Type of Advance Directive Living will  Does patient want to make changes to advanced directive? -  Copy of advanced directive(s) in chart? Yes    Chief Complaint  Patient presents with  . Medical Management of Chronic Issues    HPI: Patient is a 75 y.o. male seen today for medical management of chronic diseases.    In Feb, he was seen in the ED for acute bronchitis/COPD exacerbation.  He had thought he had the flu but this was negative. He is recovered but it was terrible.  Was first time he had that and hopes it's the last.    Had left eye surgery Monday--cataract--says he still can't see worth a flip.  Other one was a month ago.  Still watery.  Can see well with sunglasses, but sun seems extra bright now.  Hurts his eyes.  Right eye had been 20/70 and went to 20/40 with the surgery.  Left was not as bad--did have the start of macular degeneration (dry).  Is now taking an eye vitamin.    Was to go to dermatology and needed referral.  Redness in groin somewhat improved.  Still uses medication once in a while.    BPH:  Passing urine ok with the flomax.  One time nocturia only, sometimes none.  Pulmonary nodule:  Saw Dr. Melvyn Novas on 1/30 last.  He stopped lisinopril and started diovan and cough better.  BP better.  F/u with him on CT.  Hyperlipidemia: not on any meds for cholesterol  Constipation:  Does not drink water.  Doesn't always move bowels daily but not bothered lately.    Had feeling like fluid in right ear.  Has had earwax also.  None of either seen.   Past Medical History  Diagnosis Date  . Hypercholesterolemia   . Edema of male genital organs 08/20/2012  . Chest pain, unspecified 08/20/2012  . Nocturia 08/20/2012  . 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  12/26/2011  . Chest pain, unspecified 12/26/2011  . Diverticulosis of colon (without mention of hemorrhage) 06/14/2010  . Elevated blood pressure reading without diagnosis of hypertension 06/14/2010  . Unspecified constipation 05/11/2009  . Urinary tract infection, site not specified 05/11/2009  . Lumbago 05/11/2009  . Carpal tunnel syndrome 03/16/2009  . Encounter for long-term (current) use of other medications   . 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 Corns and callosities 04/11/2007  . Pain in joint, ankle and foot 04/11/2007  . Ulcer of other part of foot 03/16/2007  . Allergic rhinitis, cause unspecified 09/08/2006  . Pterygium, unspecified 03/01/2006  . Other and unspecified hyperlipidemia 02/24/2006  . COPD (chronic obstructive pulmonary disease) ([place]) 02/24/2006  . Hypertrophy of prostate without urinary obstruction and other lower urinary tract symptoms (LUTS)   . Dermatophytosis of the body   . Tobacco use disorder   . Disturbance of skin sensation   . Ventral hernia, unspecified, without mention of obstruction or gangrene   . Seborrheic dermatitis, unspecified 03/21/2001  . Rash and other nonspecific skin eruption 12/28/1999  . Concussion, unspecified 10/30/1994    Past Surgical History  Procedure Laterality Date  . Elbow surgery Right   . Knee surgery Right 01/2007    knee cap injury  . Tonsillectomy  1948  . Fracture  surgery  1986    right hip, right elbow  . Skin cancer excision  07/2014    Back and head     Allergies  Allergen Reactions  . Zocor [Simvastatin] Shortness Of Breath    Sob and itching  . Ace Inhibitors Cough    Tolerating ARB instead  . Lipitor [Atorvastatin]     Muscle and bone pain  . Pravachol [Pravastatin Sodium] Other (See Comments)    myalgias      Medication List       This list is accurate as of: 10/22/15  8:52 AM.  Always use your most recent med list.               aspirin 81 MG tablet  Take 81 mg by mouth daily.  Take one tablet once a day     ibuprofen 200 MG tablet  Commonly known as:  ADVIL,MOTRIN  Take 400 mg by mouth every 4 (four) hours as needed for fever, headache, mild pain, moderate pain or cramping.     tamsulosin 0.4 MG Caps capsule  Commonly known as:  FLOMAX  TAKE 1 CAPSULE EVERY DAY     valsartan 80 MG tablet  Commonly known as:  DIOVAN  Take 1 tablet (80 mg total) by mouth daily.        Review of Systems:  Review of Systems  Constitutional: Negative for fever, chills, weight loss and malaise/fatigue.  HENT: Positive for ear pain and hearing loss. Negative for congestion and tinnitus.        Hearing aides and able to hear me during visit  Eyes: Positive for blurred vision.  Respiratory: Negative for cough, sputum production and shortness of breath.   Cardiovascular: Negative for chest pain, palpitations and leg swelling.  Gastrointestinal: Positive for constipation. Negative for abdominal pain, diarrhea, blood in stool and melena.  Genitourinary: Negative for dysuria.  Musculoskeletal: Negative for falls.  Skin: Positive for rash.       Improved in groin, but does have derm f/u planned  Neurological: Negative for dizziness, weakness and headaches.  Psychiatric/Behavioral: Negative for memory loss.    Health Maintenance  Topic Date Due  . ZOSTAVAX  05/14/2000  . INFLUENZA VACCINE  01/05/2016  . COLONOSCOPY  04/16/2023  . TETANUS/TDAP  12/04/2023  . PNA vac Low Risk Adult  Completed    Physical Exam: Filed Vitals:   10/22/15 0843  BP: 122/60  Pulse: 60  Temp: 97.3 F (36.3 C)  TempSrc: Oral  Height: 5\' 7"  (1.702 m)  Weight: 137 lb (62.143 kg)  SpO2: 96%   Body mass index is 21.45 kg/(m^2). Physical Exam  Constitutional: He is oriented to person, place, and time. He appears well-developed and well-nourished.  Cardiovascular: Normal rate, regular rhythm, normal heart sounds and intact distal pulses.   Pulmonary/Chest: Effort normal and breath sounds  normal. No respiratory distress. He has no wheezes.  Abdominal: Soft. Bowel sounds are normal. He exhibits no distension. There is no tenderness.  Musculoskeletal: Normal range of motion.  Neurological: He is alert and oriented to person, place, and time.  Skin: Skin is warm and dry. Rash noted.  Right groin  Psychiatric: He has a normal mood and affect.    Labs reviewed: Basic Metabolic Panel:  Recent Labs  12/24/14 0816 02/22/15 1216 04/01/15 0851  NA 136 137 139  K 4.9 4.6 4.8  CL 98 104 99  CO2 24 25 24   GLUCOSE 86 101* 92  BUN 14 13 12  CREATININE 1.19 1.23 1.27  CALCIUM 8.9 9.0 9.1   Liver Function Tests:  Recent Labs  12/24/14 0816  AST 13  ALT 7  ALKPHOS 57  BILITOT 0.3  PROT 6.5  ALBUMIN 4.0   No results for input(s): LIPASE, AMYLASE in the last 8760 hours. No results for input(s): AMMONIA in the last 8760 hours. CBC:  Recent Labs  12/24/14 0816 02/22/15 1216 04/01/15 0851  WBC 5.4 8.4 5.3  NEUTROABS 3.4  --  3.4  HGB  --  12.4*  --   HCT 41.7 38.8* 41.2  MCV 83 89.0 88  PLT 237 322 271   Lipid Panel:  Recent Labs  12/24/14 0816 04/01/15 0851  CHOL 259* 252*  HDL 52 58  LDLCALC 192* 181*  TRIG 74 64  CHOLHDL 5.0 4.3   Lab Results  Component Value Date   HGBA1C 6.0* 04/01/2015    Assessment/Plan 1. Essential hypertension, benign - bp at goal with valsartan in place of ace and cough resolved -cont current meds and monitor - Basic metabolic panel; Future - CBC with Differential/Platelet; Future  2. Hypercholesterolemia -LDL has been quite elevated, but he has not tolerated statins and zetia too expensive for him (plus he admitted last time he simply does not want to take these medications) -consider referral to lipid clinic if he agrees if lipids are not any better -does not follow diet recommendations or exercise; does do some physical work - Lipid panel; Future - Hemoglobin A1c; Future  3. Benign prostatic hypertrophy (BPH)  with weak urinary stream -doing well with flomax with decrease in nocturia  4. Atopic eczema -has f/u with derm planned and referral coordinator was going to get visits approved through the end of the year with [employer]  5. Constipation, slow transit -again emphasized importance of hydration and fiber in diet with leafy greens and veggies  6. Abnormal x-ray of lungs with single pulmonary nodule -saw Dr. Melvyn Novas and was changed from lisinopril to diovan with cough improvement and better bp -has mild copd by PFTs, but it is not affecting him so no meds were recommended -had f/u CT in February which showed no interval change--Dr. Melvyn Novas recommended f/u CT in 1 year    Labs/tests ordered:   Orders Placed This Encounter  Procedures  . Basic metabolic panel    Standing Status: Future     Number of Occurrences:      Standing Expiration Date: 06/23/2016    Order Specific Question:  Has the patient fasted?    Answer:  Yes  . CBC with Differential/Platelet    Standing Status: Future     Number of Occurrences:      Standing Expiration Date: 06/23/2016  . Lipid panel    Standing Status: Future     Number of Occurrences:      Standing Expiration Date: 06/23/2016    Order Specific Question:  Has the patient fasted?    Answer:  Yes  . Hemoglobin A1c    Standing Status: Future     Number of Occurrences:      Standing Expiration Date: 06/23/2016    Next appt:  02/25/2016, labs before   Hector Taft L. Kaleisha Bhargava, D.O. [facility] [address], [place] [address] Cell Phone (Mon-Fri 8am-5pm):  [phone] On Call:  [phone] & follow prompts after 5pm & weekends Office Phone:  [phone] Office Fax:  [phone]

## 2015-12-29 DIAGNOSIS — L219 Seborrheic dermatitis, unspecified: Secondary | ICD-10-CM | POA: Diagnosis not present

## 2015-12-29 DIAGNOSIS — D225 Melanocytic nevi of trunk: Secondary | ICD-10-CM | POA: Diagnosis not present

## 2015-12-29 DIAGNOSIS — L821 Other seborrheic keratosis: Secondary | ICD-10-CM | POA: Diagnosis not present

## 2015-12-29 DIAGNOSIS — L814 Other melanin hyperpigmentation: Secondary | ICD-10-CM | POA: Diagnosis not present

## 2016-01-13 ENCOUNTER — Other Ambulatory Visit: Payer: Self-pay | Admitting: Internal Medicine

## 2016-01-22 NOTE — Addendum Note (Signed)
Addended by: Logan Bores on: 01/22/2016 03:28 PM   Modules accepted: Orders

## 2016-02-18 ENCOUNTER — Ambulatory Visit (INDEPENDENT_AMBULATORY_CARE_PROVIDER_SITE_OTHER): Payer: Commercial Managed Care - HMO | Admitting: Nurse Practitioner

## 2016-02-18 ENCOUNTER — Encounter: Payer: Self-pay | Admitting: Nurse Practitioner

## 2016-02-18 VITALS — BP 124/68 | HR 62 | Temp 97.5°F | Resp 17 | Ht 67.0 in | Wt 136.2 lb

## 2016-02-18 DIAGNOSIS — S59901A Unspecified injury of right elbow, initial encounter: Secondary | ICD-10-CM | POA: Diagnosis not present

## 2016-02-18 NOTE — Progress Notes (Signed)
Careteam: Patient Care Team: Gayland Curry, DO as PCP - General (Geriatric Medicine) Warden Fillers, MD as Consulting Physician (Ophthalmology)  Advanced Directive information Does patient have an advance directive?: Yes, Type of Advance Directive: Living will  Allergies  Allergen Reactions  . Zocor [Simvastatin] Shortness Of Breath    Sob and itching  . Ace Inhibitors Cough    Tolerating ARB instead  . Lipitor [Atorvastatin]     Muscle and bone pain  . Pravachol [Pravastatin Sodium] Other (See Comments)    myalgias    Chief Complaint  Patient presents with  . Acute Visit    Right arm pain/brusining around elbow. Pain has moved up arm and into shoulder.   . other    wants to wait until next week to get flu vaccine.     HPI: Patient is a 75 y.o. male seen in the office today due to arm pain. Last week (6 days ago) he was playing golf. After he swung the golf club felt something pull and then was in pain after that. Pain was originally to right forearm. Now pain is right elbow up into tricep. Pain is worse when he extends his arm. Thought the pain would go away but it is getting worse. Decreased strength to right arm. Bruising and swelling to right elbow. Feels better when arm is tucked under him.  Taking Advil which is helping the pain. Still able to do daily activities but does have pain.   Review of Systems:  Review of Systems  Constitutional: Negative for activity change, appetite change, fatigue and fever.  Musculoskeletal: Positive for joint swelling and myalgias.    Past Medical History:  Diagnosis Date  . Allergic rhinitis, cause unspecified 09/08/2006  . Carpal tunnel syndrome 03/16/2009  . Chest pain, unspecified 08/20/2012  . Chest pain, unspecified 12/26/2011  . Concussion, unspecified 10/30/1994  . COPD (chronic obstructive pulmonary disease) ([place]) 02/24/2006  . Corns and callosities 04/11/2007  . Dermatophytosis of the body   . Disturbance of skin  sensation   . Diverticulosis of colon (without mention of hemorrhage) 06/14/2010  . Edema of male genital organs 08/20/2012  . Elevated blood pressure reading without diagnosis of hypertension 06/14/2010  . Encounter for long-term (current) use of other medications   . Hypercholesterolemia   . Hypertrophy of prostate without urinary obstruction and other lower urinary tract symptoms (LUTS)   . 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 Lumbago 05/11/2009  . Nocturia 08/20/2012  . Other and unspecified hyperlipidemia 02/24/2006  . Pain in joint, ankle and foot 04/11/2007  . Pterygium, unspecified 03/01/2006  . Rash and other nonspecific skin eruption 12/28/1999  . Seborrheic dermatitis, unspecified 03/21/2001  . 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 Tobacco use disorder   . Ulcer of other part of foot 03/16/2007  . Unspecified constipation 05/11/2009  . Urinary tract infection, site not specified 05/11/2009  . Ventral hernia, unspecified, without mention of obstruction or gangrene    Past Surgical History:  Procedure Laterality Date  . ELBOW SURGERY Right   . FRACTURE SURGERY  1986   right hip, right elbow  . KNEE SURGERY Right 01/2007   knee cap injury  . SKIN CANCER EXCISION  07/2014   Back and head   . TONSILLECTOMY  1948   Social History:   reports that he quit smoking about 5 years ago. He has never used smokeless tobacco. He reports that he does  not drink alcohol or use drugs.  Family History  Problem Relation Age of Onset  . Diabetes Mother   . Hypertension Mother   . Cancer Father     Medications: Patient's Medications  New Prescriptions   No medications on file  Previous Medications   ASPIRIN 81 MG TABLET    Take 81 mg by mouth daily. Take one tablet once a day   IBUPROFEN (ADVIL,MOTRIN) 200 MG TABLET    Take 400 mg by mouth every 4 (four) hours as needed for fever, headache, mild pain, moderate pain or cramping.    TAMSULOSIN (FLOMAX) 0.4 MG CAPS CAPSULE    TAKE 1 CAPSULE EVERY DAY   VALSARTAN (DIOVAN) 80 MG TABLET    Take 1 tablet (80 mg total) by mouth daily.  Modified Medications   No medications on file  Discontinued Medications   No medications on file     Physical Exam:  Vitals:   02/18/16 1128  BP: 124/68  Pulse: 62  Resp: 17  Temp: 97.5 F (36.4 C)  TempSrc: Oral  SpO2: 96%  Weight: 136 lb 3.2 oz (61.8 kg)  Height: 5\' 7"  (1.702 m)   Body mass index is 21.33 kg/m.  Physical Exam  Constitutional: He appears well-developed and well-nourished.  Cardiovascular: Normal rate, regular rhythm and normal heart sounds.   Pulmonary/Chest: Effort normal and breath sounds normal.  Musculoskeletal:       Right elbow: He exhibits swelling. He exhibits normal range of motion. Tenderness found. Medial epicondyle tenderness noted.  eccoy  Skin: Skin is warm and dry.    Labs reviewed: Basic Metabolic Panel:  Recent Labs  02/22/15 1216 04/01/15 0851  NA 137 139  K 4.6 4.8  CL 104 99  CO2 25 24  GLUCOSE 101* 92  BUN 13 12  CREATININE 1.23 1.27  CALCIUM 9.0 9.1   Liver Function Tests: No results for input(s): AST, ALT, ALKPHOS, BILITOT, PROT, ALBUMIN in the last 8760 hours. No results for input(s): LIPASE, AMYLASE in the last 8760 hours. No results for input(s): AMMONIA in the last 8760 hours. CBC:  Recent Labs  02/22/15 1216 04/01/15 0851  WBC 8.4 5.3  NEUTROABS  --  3.4  HGB 12.4*  --   HCT 38.8* 41.2  MCV 89.0 88  PLT 322 271   Lipid Panel:  Recent Labs  04/01/15 0851  CHOL 252*  HDL 58  LDLCALC 181*  TRIG 64  CHOLHDL 4.3   TSH: No results for input(s): TSH in the last 8760 hours. A1C: Lab Results  Component Value Date   HGBA1C 6.0 (H) 04/01/2015     Assessment/Plan 1. Elbow injury, right, initial encounter Possible medial epicondyle tendon tear - Ambulatory referral to Orthopedics for further evaluation and treatment -to rest elbow and to cont to  use ibuprofen as needed    Dahna Hattabaugh K. Harle Battiest  [facility] [phone] 8 am - 5 pm) [phone] (after hours)

## 2016-02-19 DIAGNOSIS — M25521 Pain in right elbow: Secondary | ICD-10-CM | POA: Diagnosis not present

## 2016-02-19 DIAGNOSIS — M19121 Post-traumatic osteoarthritis, right elbow: Secondary | ICD-10-CM | POA: Diagnosis not present

## 2016-02-22 ENCOUNTER — Other Ambulatory Visit: Payer: Self-pay | Admitting: Internal Medicine

## 2016-02-22 ENCOUNTER — Other Ambulatory Visit: Payer: Commercial Managed Care - HMO

## 2016-02-22 DIAGNOSIS — I10 Essential (primary) hypertension: Secondary | ICD-10-CM

## 2016-02-22 DIAGNOSIS — E7800 Pure hypercholesterolemia, unspecified: Secondary | ICD-10-CM

## 2016-02-22 DIAGNOSIS — N401 Enlarged prostate with lower urinary tract symptoms: Secondary | ICD-10-CM | POA: Diagnosis not present

## 2016-02-22 DIAGNOSIS — E785 Hyperlipidemia, unspecified: Secondary | ICD-10-CM | POA: Diagnosis not present

## 2016-02-22 LAB — CBC WITH DIFFERENTIAL/PLATELET
Basophils Absolute: 0 cells/uL (ref 0–200)
Basophils Relative: 0 %
Eosinophils Absolute: 185 cells/uL (ref 15–500)
Eosinophils Relative: 5 %
HCT: 38.6 % (ref 38.5–50.0)
Hemoglobin: 12.9 g/dL — ABNORMAL LOW (ref 13.2–17.1)
Lymphocytes Relative: 27 %
Lymphs Abs: 999 cells/uL (ref 850–3900)
MCH: 30.1 pg (ref 27.0–33.0)
MCHC: 33.4 g/dL (ref 32.0–36.0)
MCV: 90.2 fL (ref 80.0–100.0)
MPV: 10.3 fL (ref 7.5–12.5)
Monocytes Absolute: 222 cells/uL (ref 200–950)
Monocytes Relative: 6 %
Neutro Abs: 2294 cells/uL (ref 1500–7800)
Neutrophils Relative %: 62 %
Platelets: 220 10*3/uL (ref 140–400)
RBC: 4.28 MIL/uL (ref 4.20–5.80)
RDW: 13.9 % (ref 11.0–15.0)
WBC: 3.7 10*3/uL — ABNORMAL LOW (ref 3.8–10.8)

## 2016-02-23 LAB — BASIC METABOLIC PANEL
BUN: 14 mg/dL (ref 7–25)
CO2: 27 mmol/L (ref 20–31)
Calcium: 8.9 mg/dL (ref 8.6–10.3)
Chloride: 108 mmol/L (ref 98–110)
Creat: 1.1 mg/dL (ref 0.70–1.18)
Glucose, Bld: 92 mg/dL (ref 65–99)
Potassium: 4.7 mmol/L (ref 3.5–5.3)
Sodium: 141 mmol/L (ref 135–146)

## 2016-02-23 LAB — LIPID PANEL
Cholesterol: 214 mg/dL — ABNORMAL HIGH (ref 125–200)
HDL: 51 mg/dL (ref >=40)
LDL Cholesterol: 149 mg/dL — ABNORMAL HIGH (ref <130)
Total CHOL/HDL Ratio: 4.2 Ratio (ref <=5.0)
Triglycerides: 68 mg/dL (ref <150)
VLDL: 14 mg/dL (ref <30)

## 2016-02-23 LAB — HEMOGLOBIN A1C
Hgb A1c MFr Bld: 5.6 % (ref <5.7)
Mean Plasma Glucose: 114 mg/dL

## 2016-02-25 ENCOUNTER — Encounter: Payer: Self-pay | Admitting: Internal Medicine

## 2016-02-25 ENCOUNTER — Ambulatory Visit (INDEPENDENT_AMBULATORY_CARE_PROVIDER_SITE_OTHER): Payer: Commercial Managed Care - HMO | Admitting: Internal Medicine

## 2016-02-25 VITALS — BP 130/66 | HR 50 | Temp 97.5°F | Ht 67.0 in | Wt 136.0 lb

## 2016-02-25 DIAGNOSIS — Z820 Family history of epilepsy and other diseases of the nervous system: Secondary | ICD-10-CM | POA: Insufficient documentation

## 2016-02-25 DIAGNOSIS — Z23 Encounter for immunization: Secondary | ICD-10-CM | POA: Diagnosis not present

## 2016-02-25 DIAGNOSIS — I10 Essential (primary) hypertension: Secondary | ICD-10-CM

## 2016-02-25 DIAGNOSIS — M7701 Medial epicondylitis, right elbow: Secondary | ICD-10-CM

## 2016-02-25 DIAGNOSIS — E7800 Pure hypercholesterolemia, unspecified: Secondary | ICD-10-CM | POA: Diagnosis not present

## 2016-02-25 DIAGNOSIS — M19021 Primary osteoarthritis, right elbow: Secondary | ICD-10-CM | POA: Insufficient documentation

## 2016-02-25 DIAGNOSIS — N401 Enlarged prostate with lower urinary tract symptoms: Secondary | ICD-10-CM | POA: Diagnosis not present

## 2016-02-25 DIAGNOSIS — M1990 Unspecified osteoarthritis, unspecified site: Secondary | ICD-10-CM

## 2016-02-25 DIAGNOSIS — R3912 Poor urinary stream: Secondary | ICD-10-CM

## 2016-02-25 LAB — PSA: PSA: 0.7 ng/mL (ref <=4.0)

## 2016-02-25 NOTE — Progress Notes (Addendum)
Location:  [facility] clinic Provider:  Emannuel Vise L. Mariea Clonts, D.O., C.M.D.  Code Status: DNR Goals of Care:  Advanced Directives 02/25/2016  Does patient have an advance directive? Yes  Type of Advance Directive Living will  Does patient want to make changes to advanced directive? -  Copy of advanced directive(s) in chart? Yes  Would patient like information on creating an advanced directive? -   Chief Complaint  Patient presents with  . Medical Management of Chronic Issues    4 month medication management, review labs    HPI: Patient is a 75 y.o. male seen today for medical management of chronic diseases.    Apparently, [facility] felt he had golfer's elbow after he had the xray and showed arthritis.  He gave him some medication for a week and something for his stomach and told him he should be better in 2 wks.   bruising is moving down his arm and now more yellow.  Hurts more at night when he lies down on it.  He says he has had an enlarged prostate.  Not having frequency.  flomax helps.  No trouble starting his stream.    Flu shot given today.  Bad cholesterol is trending down.  Hyperglycemia improved to normal.    His sister and mother have had alzheimer's.  Sister still living with hers.    Past Medical History:  Diagnosis Date  . Allergic rhinitis, cause unspecified 09/08/2006  . Carpal tunnel syndrome 03/16/2009  . Chest pain, unspecified 08/20/2012  . Chest pain, unspecified 12/26/2011  . Concussion, unspecified 10/30/1994  . COPD (chronic obstructive pulmonary disease) ([place]) 02/24/2006  . Corns and callosities 04/11/2007  . Dermatophytosis of the body   . Disturbance of skin sensation   . Diverticulosis of colon (without mention of hemorrhage) 06/14/2010  . Edema of male genital organs 08/20/2012  . Elevated blood pressure reading without diagnosis of hypertension 06/14/2010  . Encounter for long-term (current) use of other medications   . Hypercholesterolemia     . Hypertrophy of prostate without urinary obstruction and other lower urinary tract symptoms (LUTS)   . 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 Lumbago 05/11/2009  . Nocturia 08/20/2012  . Other and unspecified hyperlipidemia 02/24/2006  . Pain in joint, ankle and foot 04/11/2007  . Pterygium, unspecified 03/01/2006  . Rash and other nonspecific skin eruption 12/28/1999  . Seborrheic dermatitis, unspecified 03/21/2001  . 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 Tobacco use disorder   . Ulcer of other part of foot 03/16/2007  . Unspecified constipation 05/11/2009  . Urinary tract infection, site not specified 05/11/2009  . Ventral hernia, unspecified, without mention of obstruction or gangrene     Past Surgical History:  Procedure Laterality Date  . ELBOW SURGERY Right   . FRACTURE SURGERY  1986   right hip, right elbow  . KNEE SURGERY Right 01/2007   knee cap injury  . SKIN CANCER EXCISION  07/2014   Back and head   . TONSILLECTOMY  1948    Allergies  Allergen Reactions  . Zocor [Simvastatin] Shortness Of Breath    Sob and itching  . Ace Inhibitors Cough    Tolerating ARB instead  . Lipitor [Atorvastatin]     Muscle and bone pain  . Pravachol [Pravastatin Sodium] Other (See Comments)    myalgias      Medication List       Accurate as of 02/25/16  8:46  AM. Always use your most recent med list.          aspirin 81 MG tablet Take 81 mg by mouth daily. Take one tablet once a day   ibuprofen 200 MG tablet Commonly known as:  ADVIL,MOTRIN Take 400 mg by mouth every 4 (four) hours as needed for fever, headache, mild pain, moderate pain or cramping.   tamsulosin 0.4 MG Caps capsule Commonly known as:  FLOMAX TAKE 1 CAPSULE EVERY DAY   valsartan 80 MG tablet Commonly known as:  DIOVAN Take 1 tablet (80 mg total) by mouth daily.       Review of Systems:  Review of Systems  Constitutional: Negative for chills and  fever.  HENT: Positive for hearing loss. Negative for congestion.   Eyes: Negative for blurred vision.  Respiratory: Negative for cough and shortness of breath.   Cardiovascular: Negative for chest pain and palpitations.  Gastrointestinal: Negative for abdominal pain, blood in stool, constipation and melena.  Genitourinary: Negative for dysuria, frequency and urgency.       Improved urinary stream with flomax  Musculoskeletal: Positive for joint pain and myalgias. Negative for falls.  Neurological: Negative for dizziness and loss of consciousness.  Psychiatric/Behavioral: Negative for depression and memory loss.    Health Maintenance  Topic Date Due  . ZOSTAVAX  05/14/2000  . INFLUENZA VACCINE  01/05/2016  . COLONOSCOPY  04/16/2023  . TETANUS/TDAP  12/04/2023  . PNA vac Low Risk Adult  Completed    Physical Exam: Vitals:   02/25/16 0830  BP: 130/66  Pulse: (!) 50  Temp: 97.5 F (36.4 C)  TempSrc: Oral  SpO2: 97%  Weight: 136 lb (61.7 kg)  Height: 5\' 7"  (1.702 m)   Body mass index is 21.3 kg/m. Physical Exam  Constitutional: He is oriented to person, place, and time. He appears well-developed and well-nourished. No distress.  Cardiovascular: Normal rate, regular rhythm, normal heart sounds and intact distal pulses.   Pulmonary/Chest: Effort normal and breath sounds normal. No respiratory distress.  Abdominal: Soft. Bowel sounds are normal.  Musculoskeletal: Normal range of motion. He exhibits tenderness.  Mild ecchymoses of right forearm remain and some olecranon and forearm tenderness  Neurological: He is alert and oriented to person, place, and time.    Labs reviewed: Basic Metabolic Panel:  Recent Labs  04/01/15 0851 02/22/16 0835  NA 139 141  K 4.8 4.7  CL 99 108  CO2 24 27  GLUCOSE 92 92  BUN 12 14  CREATININE 1.27 1.10  CALCIUM 9.1 8.9   Liver Function Tests: No results for input(s): AST, ALT, ALKPHOS, BILITOT, PROT, ALBUMIN in the last 8760  hours. No results for input(s): LIPASE, AMYLASE in the last 8760 hours. No results for input(s): AMMONIA in the last 8760 hours. CBC:  Recent Labs  04/01/15 0851 02/22/16 0835  WBC 5.3 3.7*  NEUTROABS 3.4 2,294  HGB  --  12.9*  HCT 41.2 38.6  MCV 88 90.2  PLT 271 220   Lipid Panel:  Recent Labs  04/01/15 0851 02/22/16 0835  CHOL 252* 214*  HDL 58 51  LDLCALC 181* 149*  TRIG 64 68  CHOLHDL 4.3 4.2   Lab Results  Component Value Date   HGBA1C 5.6 02/22/2016    Assessment/Plan 1. Golfer's elbow, right -improving gradually, had severe bruising so NP and I were concerned he'd actually torn something -don't have note from ortho but pt reports he had only golfer's elbow and oa of elbow  2.  Arthritis of right elbow -pain improving gradually from injury  3. Essential hypertension, benign -bp at goal with current meds, no changes  4. Hypercholesterolemia -lipids did improve since last visit when he was counseled on dietary changes, he is physically active all year long  -did not tolerate statin therapy due to myalgias, cramps  5. Benign prostatic hypertrophy (BPH) with weak urinary stream -cont flomax which has helped considerably -requests PSA be added to labs today   6. Need for influenza vaccination - Flu Vaccine QUAD 36+ mos IM  7. Family history of Alzheimer's disease -in sister and mother -will monitor MMSE carefully and functional status  Labs/tests ordered:   Orders Placed This Encounter  Procedures  . Flu Vaccine QUAD 36+ mos IM   Next appt:  06/30/2016  Chanda Laperle L. Shahed Yeoman, D.O. [facility] [address], [place] [address] Cell Phone (Mon-Fri 8am-5pm):  [phone] On Call:  [phone] & follow prompts after 5pm & weekends Office Phone:  [phone] Office Fax:  [phone]

## 2016-03-04 DIAGNOSIS — Z961 Presence of intraocular lens: Secondary | ICD-10-CM | POA: Diagnosis not present

## 2016-04-29 ENCOUNTER — Other Ambulatory Visit: Payer: Self-pay | Admitting: Internal Medicine

## 2016-05-04 ENCOUNTER — Other Ambulatory Visit: Payer: Self-pay | Admitting: *Deleted

## 2016-05-04 MED ORDER — VALSARTAN 80 MG PO TABS: 80.0000 mg | ORAL_TABLET | Freq: Every day | ORAL | 3 refills | Status: DC

## 2016-05-04 MED ORDER — TAMSULOSIN HCL 0.4 MG PO CAPS: 0.4000 mg | ORAL_CAPSULE | Freq: Every day | ORAL | 3 refills | Status: DC

## 2016-05-04 NOTE — Telephone Encounter (Signed)
Patient requested Rx to be sent to [facility]

## 2016-05-24 ENCOUNTER — Telehealth: Payer: Self-pay | Admitting: *Deleted

## 2016-05-24 DIAGNOSIS — R911 Solitary pulmonary nodule: Secondary | ICD-10-CM

## 2016-05-24 NOTE — Telephone Encounter (Signed)
-----   Message from Tanda Rockers, MD sent at 07/16/2015  1:05 PM EST ----- Ct chest s contrast f/u mpns

## 2016-06-27 ENCOUNTER — Ambulatory Visit: Payer: Commercial Managed Care - HMO | Admitting: Internal Medicine

## 2016-06-27 ENCOUNTER — Ambulatory Visit (INDEPENDENT_AMBULATORY_CARE_PROVIDER_SITE_OTHER): Payer: Medicare HMO

## 2016-06-27 VITALS — BP 146/70 | HR 65 | Temp 97.6°F | Ht 67.0 in | Wt 139.6 lb

## 2016-06-27 DIAGNOSIS — Z0000 Encounter for general adult medical examination without abnormal findings: Secondary | ICD-10-CM | POA: Diagnosis not present

## 2016-06-27 NOTE — Progress Notes (Signed)
Subjective:   Thomas Reyes is a 76 y.o. male who presents for an Initial Medicare Annual Wellness Visit.  Review of Systems  Cardiac Risk Factors include: advanced age (>33men, >29 women);male gender;dyslipidemia    Objective:    Today's Vitals   06/27/16 1518  BP: (!) 146/70  Pulse: 65  Temp: 97.6 F (36.4 C)  TempSrc: Oral  SpO2: 97%  Weight: 139 lb 9.6 oz (63.3 kg)  Height: 5\' 7"  (1.702 m)   Body mass index is 21.86 kg/m.  Current Medications (verified) Outpatient Encounter Prescriptions as of 06/27/2016  Medication Sig  . aspirin 81 MG tablet Take 81 mg by mouth daily. Take one tablet once a day  . ibuprofen (ADVIL,MOTRIN) 200 MG tablet Take 400 mg by mouth every 4 (four) hours as needed for fever, headache, mild pain, moderate pain or cramping.  . tamsulosin (FLOMAX) 0.4 MG CAPS capsule Take 1 capsule (0.4 mg total) by mouth daily.  . valsartan (DIOVAN) 80 MG tablet Take 1 tablet (80 mg total) by mouth daily.   No facility-administered encounter medications on file as of 06/27/2016.     Allergies (verified) Zocor [simvastatin]; Ace inhibitors; Lipitor [atorvastatin]; and Pravachol [pravastatin sodium]   History: Past Medical History:  Diagnosis Date  . Allergic rhinitis, cause unspecified 09/08/2006  . Carpal tunnel syndrome 03/16/2009  . Chest pain, unspecified 08/20/2012  . Chest pain, unspecified 12/26/2011  . Concussion, unspecified 10/30/1994  . COPD (chronic obstructive pulmonary disease) ([place]) 02/24/2006  . Corns and callosities 04/11/2007  . Dermatophytosis of the body   . Disturbance of skin sensation   . Diverticulosis of colon (without mention of hemorrhage) 06/14/2010  . Edema of male genital organs 08/20/2012  . Elevated blood pressure reading without diagnosis of hypertension 06/14/2010  . Encounter for long-term (current) use of other medications   . Hypercholesterolemia   . Hypertrophy of prostate without urinary obstruction and other  lower urinary tract symptoms (LUTS)   . 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 Lumbago 05/11/2009  . Nocturia 08/20/2012  . Other and unspecified hyperlipidemia 02/24/2006  . Pain in joint, ankle and foot 04/11/2007  . Pterygium, unspecified 03/01/2006  . Rash and other nonspecific skin eruption 12/28/1999  . Seborrheic dermatitis, unspecified 03/21/2001  . 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 Tobacco use disorder   . Ulcer of other part of foot 03/16/2007  . Unspecified constipation 05/11/2009  . Urinary tract infection, site not specified 05/11/2009  . Ventral hernia, unspecified, without mention of obstruction or gangrene    Past Surgical History:  Procedure Laterality Date  . ELBOW SURGERY Right   . EYE SURGERY     Cataract Removal  . FRACTURE SURGERY  1986   right hip, right elbow  . KNEE SURGERY Right 01/2007   knee cap injury  . SKIN CANCER EXCISION  07/2014   Back and head   . TONSILLECTOMY  1948   Family History  Problem Relation Age of Onset  . Diabetes Mother   . Hypertension Mother   . Cancer Father    Social History   Occupational History  . Not on file.   Social History Main Topics  . Smoking status: Former Smoker    Quit date: 06/09/2010  . Smokeless tobacco: Never Used  . Alcohol use No  . Drug use: No  . Sexual activity: Not Currently   Tobacco Counseling Counseling given: No   Activities of Daily  Living In your present state of health, do you have any difficulty performing the following activities: 06/27/2016  Hearing? Y  Vision? Y  Difficulty concentrating or making decisions? Y  Walking or climbing stairs? N  Dressing or bathing? N  Doing errands, shopping? N  Preparing Food and eating ? N  Using the Toilet? N  In the past six months, have you accidently leaked urine? N  Do you have problems with loss of bowel control? N  Managing your Medications? N  Managing your Finances? N    Housekeeping or managing your Housekeeping? N  Some recent data might be hidden    Immunizations and Health Maintenance Immunization History  Administered Date(s) Administered  . Influenza,inj,Quad PF,36+ Mos 03/31/2014, 04/03/2015, 02/25/2016  . Pneumococcal Conjugate-13 07/18/2013  . Pneumococcal Polysaccharide-23 10/02/2014  . Tdap 06/10/2005, 12/03/2013   There are no preventive care reminders to display for this patient.  Patient Care Team: Gayland Curry, DO as PCP - General (Geriatric Medicine) Warden Fillers, MD as Consulting Physician (Ophthalmology)  Indicate any recent Medical Services you may have received from other than [facility] providers in the past year (date may be approximate).    Assessment:   This is a routine wellness examination for Thomas Reyes.  Hearing/Vision screen Hearing Screening Comments: Pt has bilateral hearing aids. Last hearing screen done 06/20/16 at [facility].  Vision Screening Comments: Last eye exam done March 2017 (cataract removal) done with Dr. Katy Fitch at [place].   Dietary issues and exercise activities discussed: Current Exercise Habits: The patient does not participate in regular exercise at present, Exercise limited by: None identified  Goals    . <enter goal here>          Starting 06/27/16, I will maintain my current lifestyle.       Depression Screen PHQ 2/9 Scores 06/27/2016 10/22/2015 04/03/2015 03/31/2014  PHQ - 2 Score 0 0 0 0    Fall Risk Fall Risk  06/27/2016 02/18/2016 10/22/2015 06/25/2015 04/03/2015  Falls in the past year? No No No No No    Cognitive Function: MMSE - Mini Mental State Exam 04/03/2015  Orientation to time 4  Orientation to Place 5  Registration 3  Attention/ Calculation 5  Recall 2  Language- name 2 objects 2  Language- repeat 1  Language- follow 3 step command 3  Language- read & follow direction 1  Write a sentence 1  Copy design 1  Total  score 28        Screening Tests Health Maintenance  Topic Date Due  . ZOSTAVAX  06/07/2019 (Originally 05/14/2000)  . TETANUS/TDAP  12/04/2023  . INFLUENZA VACCINE  Completed  . PNA vac Low Risk Adult  Completed        Plan:    I have personally reviewed and addressed the Medicare Annual Wellness questionnaire and have noted the following in the patient's chart:  A. Medical and social history B. Use of alcohol, tobacco or illicit drugs  C. Current medications and supplements D. Functional ability and status E.  Nutritional status F.  Physical activity G. Advance directives H. List of other physicians I.  Hospitalizations, surgeries, and ER visits in previous 12 months J.  [place] to include hearing, vision, cognitive, depression L. Referrals and appointments - none  In addition, I have reviewed and discussed with patient certain preventive protocols, quality metrics, and best practice recommendations. A written personalized care plan for preventive services as  well as general preventive health recommendations were provided to patient.  See attached scanned questionnaire for additional information.   Signed,   Allyn Kenner, LPN Health Advisor

## 2016-06-27 NOTE — Progress Notes (Signed)
   I reviewed health advisor's note, was available for consultation and agree with the assessment and plan as written.  Will evaluate for possible hemorrhoids at his appt with me.  Nikalas Bramel L. Delaina Fetsch, D.O. [facility] [address], [place] [address] Cell Phone (Mon-Fri 8am-5pm):  [phone] On Call:  [phone] & follow prompts after 5pm & weekends Office Phone:  [phone] Office Fax:  [phone]   Quick Notes   Health Maintenance:  Up to date on maintenance    Abnormal Screen: None; MMSE-26/30; Passed Clock test     Patient Concerns:  Pt believes he might have hemorrhoids, states it has been itching a lot the past several months.     Nurse Concerns:  None

## 2016-06-27 NOTE — Patient Instructions (Addendum)
Mr. Clamp , Thank you for taking time to come for your Medicare Wellness Visit. I appreciate your ongoing commitment to your health goals. Please review the following plan we discussed and let me know if I can assist you in the future.   These are the goals we discussed: Goals    . <enter goal here>          Starting 06/27/16, I will maintain my current lifestyle.        This is a list of the screening recommended for you and due dates:  Health Maintenance  Topic Date Due  . Shingles Vaccine  06/07/2019*  . Tetanus Vaccine  12/04/2023  . Flu Shot  Completed  . Pneumonia vaccines  Completed  *Topic was postponed. The date shown is not the original due date.  Preventive Care for Adults  A healthy lifestyle and preventive care can promote health and wellness. Preventive health guidelines for adults include the following key practices.  . A routine yearly physical is a good way to check with your health care provider about your health and preventive screening. It is a chance to share any concerns and updates on your health and to receive a thorough exam.  . Visit your dentist for a routine exam and preventive care every 6 months. Brush your teeth twice a day and floss once a day. Good oral hygiene prevents tooth decay and gum disease.  . The frequency of eye exams is based on your age, health, family medical history, use  of contact lenses, and other factors. Follow your health care provider's ecommendations for frequency of eye exams.  . Eat a healthy diet. Foods like vegetables, fruits, whole grains, low-fat dairy products, and lean protein foods contain the nutrients you need without too many calories. Decrease your intake of foods high in solid fats, added sugars, and salt. Eat the right amount of calories for you. Get information about a proper diet from your health care provider, if necessary.  . Regular physical exercise is one of the most important things you can do for your  health. Most adults should get at least 150 minutes of moderate-intensity exercise (any activity that increases your heart rate and causes you to sweat) each week. In addition, most adults need muscle-strengthening exercises on 2 or more days a week.  Silver Sneakers may be a benefit available to you. To determine eligibility, you may visit the website: www.silversneakers.com or contact program at [phone] Mon-Fri between 8AM-8PM.   . Maintain a healthy weight. The body mass index (BMI) is a screening tool to identify possible weight problems. It provides an estimate of body fat based on height and weight. Your health care provider can find your BMI and can help you achieve or maintain a healthy weight.   For adults 20 years and older: ? A BMI below 18.5 is considered underweight. ? A BMI of 18.5 to 24.9 is normal. ? A BMI of 25 to 29.9 is considered overweight. ? A BMI of 30 and above is considered obese.   . Maintain normal blood lipids and cholesterol levels by exercising and minimizing your intake of saturated fat. Eat a balanced diet with plenty of fruit and vegetables. Blood tests for lipids and cholesterol should begin at age 47 and be repeated every 5 years. If your lipid or cholesterol levels are high, you are over 50, or you are at high risk for heart disease, you may need your cholesterol levels checked more frequently. Ongoing high  lipid and cholesterol levels should be treated with medicines if diet and exercise are not working.  . If you smoke, find out from your health care provider how to quit. If you do not use tobacco, please do not start.  . If you choose to drink alcohol, please do not consume more than 2 drinks per day. One drink is considered to be 12 ounces (355 mL) of beer, 5 ounces (148 mL) of wine, or 1.5 ounces (44 mL) of liquor.  . If you are 63-51 years old, ask your health care provider if you should take aspirin to prevent strokes.  . Use sunscreen. Apply  sunscreen liberally and repeatedly throughout the day. You should seek shade when your shadow is shorter than you. Protect yourself by wearing long sleeves, pants, a wide-brimmed hat, and sunglasses year round, whenever you are outdoors.  . Once a month, do a whole body skin exam, using a mirror to look at the skin on your back. Tell your health care provider of new moles, moles that have irregular borders, moles that are larger than a pencil eraser, or moles that have changed in shape or color.

## 2016-06-30 ENCOUNTER — Encounter: Payer: Self-pay | Admitting: Internal Medicine

## 2016-06-30 ENCOUNTER — Ambulatory Visit: Payer: Commercial Managed Care - HMO

## 2016-06-30 ENCOUNTER — Ambulatory Visit (INDEPENDENT_AMBULATORY_CARE_PROVIDER_SITE_OTHER): Payer: Medicare HMO | Admitting: Internal Medicine

## 2016-06-30 VITALS — BP 120/60 | HR 64 | Temp 97.5°F | Ht 67.0 in | Wt 137.0 lb

## 2016-06-30 DIAGNOSIS — E7800 Pure hypercholesterolemia, unspecified: Secondary | ICD-10-CM

## 2016-06-30 DIAGNOSIS — Z0000 Encounter for general adult medical examination without abnormal findings: Secondary | ICD-10-CM

## 2016-06-30 DIAGNOSIS — K649 Unspecified hemorrhoids: Secondary | ICD-10-CM

## 2016-06-30 DIAGNOSIS — J449 Chronic obstructive pulmonary disease, unspecified: Secondary | ICD-10-CM | POA: Diagnosis not present

## 2016-06-30 DIAGNOSIS — I10 Essential (primary) hypertension: Secondary | ICD-10-CM

## 2016-06-30 NOTE — Patient Instructions (Signed)
Anusol cream or preparation H cream  For hemorrhoidal itching.   Preparation H wipes for hemorrhoidal itching Anusol suppositories if hemorrhoids become sore or bleed

## 2016-06-30 NOTE — Progress Notes (Signed)
Provider:  Rexene Edison. Mariea Clonts, D.O., C.M.D. Location:   [facility]   Place of Service: clinic     Previous PCP: Hollace Kinnier, DO Patient Care Team: Gayland Curry, DO as PCP - General (Geriatric Medicine) Warden Fillers, MD as Consulting Physician (Ophthalmology)  Extended Emergency Contact Information Primary Emergency Contact: [facility] Address: [place], [place] [address] [place] Phone: [phone] Relation: Spouse  Code Status: DNR Goals of Care: Advanced Directive information Advanced Directives 06/27/2016  Does Patient Have a Medical Advance Directive? Yes  Type of Advance Directive [place]  Does patient want to make changes to medical advance directive? -  Copy of [place] in Chart? Yes  Would patient like information on creating a medical advance directive? -   Chief Complaint  Patient presents with  . Annual Exam    cpe    HPI: Patient is a 76 y.o. male seen today for an annual physical exam.    He had his wellness visit on 06/27/16.  Main thing from it was that he has had itchy hemorrhoids which are already being treated.  It's flaring up more often.  Does not always have a bm daily.  Sometimes if he goes two days w/o going, he goes 3-4 times.    BP was also elevated, but is normal on today's visit.  Has a pulmonary appt coming up in Feb and an ophtho appt coming in April.  Also sees derm again in April or May.    A+D ointment helps his groin itching.    Past Medical History:  Diagnosis Date  . Allergic rhinitis, cause unspecified 09/08/2006  . Carpal tunnel syndrome 03/16/2009  . Chest pain, unspecified 08/20/2012  . Chest pain, unspecified 12/26/2011  . Concussion, unspecified 10/30/1994  . COPD (chronic obstructive pulmonary disease) ([place]) 02/24/2006  . Corns and callosities 04/11/2007  . Dermatophytosis of the body   . Disturbance of skin sensation   .  Diverticulosis of colon (without mention of hemorrhage) 06/14/2010  . Edema of male genital organs 08/20/2012  . Elevated blood pressure reading without diagnosis of hypertension 06/14/2010  . Encounter for long-term (current) use of other medications   . Hypercholesterolemia   . Hypertrophy of prostate without urinary obstruction and other lower urinary tract symptoms (LUTS)   . 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 Lumbago 05/11/2009  . Nocturia 08/20/2012  . Other and unspecified hyperlipidemia 02/24/2006  . Pain in joint, ankle and foot 04/11/2007  . Pterygium, unspecified 03/01/2006  . Rash and other nonspecific skin eruption 12/28/1999  . Seborrheic dermatitis, unspecified 03/21/2001  . 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 Tobacco use disorder   . Ulcer of other part of foot 03/16/2007  . Unspecified constipation 05/11/2009  . Urinary tract infection, site not specified 05/11/2009  . Ventral hernia, unspecified, without mention of obstruction or gangrene    Past Surgical History:  Procedure Laterality Date  . ELBOW SURGERY Right   . EYE SURGERY     Cataract Removal  . FRACTURE SURGERY  1986   right hip, right elbow  . KNEE SURGERY Right 01/2007   knee cap injury  . SKIN CANCER EXCISION  07/2014   Back and head   . TONSILLECTOMY  1948    reports that he quit smoking about 6 years ago. He has never used smokeless  tobacco. He reports that he does not drink alcohol or use drugs.  Functional Status Survey:    Family History  Problem Relation Age of Onset  . Diabetes Mother   . Hypertension Mother   . Cancer Father     Health Maintenance  Topic Date Due  . ZOSTAVAX  06/07/2019 (Originally 05/14/2000)  . TETANUS/TDAP  12/04/2023  . INFLUENZA VACCINE  Completed  . PNA vac Low Risk Adult  Completed    Allergies  Allergen Reactions  . Zocor [Simvastatin] Shortness Of Breath    Sob and itching  . Ace Inhibitors  Cough    Tolerating ARB instead  . Lipitor [Atorvastatin]     Muscle and bone pain  . Pravachol [Pravastatin Sodium] Other (See Comments)    myalgias    Allergies as of 06/30/2016      Reactions   Zocor [simvastatin] Shortness Of Breath   Sob and itching   Ace Inhibitors Cough   Tolerating ARB instead   Lipitor [atorvastatin]    Muscle and bone pain   Pravachol [pravastatin Sodium] Other (See Comments)   myalgias      Medication List       Accurate as of 06/30/16  2:09 PM. Always use your most recent med list.          aspirin 81 MG tablet Take 81 mg by mouth daily. Take one tablet once a day   ibuprofen 200 MG tablet Commonly known as:  ADVIL,MOTRIN Take 400 mg by mouth every 4 (four) hours as needed for fever, headache, mild pain, moderate pain or cramping.   tamsulosin 0.4 MG Caps capsule Commonly known as:  FLOMAX Take 1 capsule (0.4 mg total) by mouth daily.   valsartan 80 MG tablet Commonly known as:  DIOVAN Take 1 tablet (80 mg total) by mouth daily.       Review of Systems  Constitutional: Negative for chills, fever and malaise/fatigue.  HENT: Positive for hearing loss. Negative for congestion.        Now wearing hearing aides  Eyes: Negative for blurred vision.  Respiratory: Negative for cough, shortness of breath and wheezing.   Cardiovascular: Negative for chest pain, palpitations and leg swelling.  Gastrointestinal: Negative for abdominal pain, blood in stool, constipation, diarrhea, heartburn, melena, nausea and vomiting.       Hemorrhoids  Genitourinary: Negative for dysuria.  Musculoskeletal: Negative for falls and joint pain.  Skin: Positive for itching. Negative for rash.       Groin continues to itch him at times, but quit using prescribed cream and didn't call for refills  Neurological: Negative for weakness.    Vitals:   06/30/16 1356  BP: 120/60  Pulse: 64  Temp: 97.5 F (36.4 C)  TempSrc: Oral  SpO2: 97%  Weight: 137 lb (62.1  kg)  Height: 5\' 7"  (1.702 m)   Body mass index is 21.46 kg/m. Physical Exam  Constitutional: He is oriented to person, place, and time. He appears well-developed. No distress.  Eyes: Conjunctivae and EOM are normal. Pupils are equal, round, and reactive to light.  Mild erythema of left sclera laterally, reports had stye yesterday that's now gone  Neck: Neck supple. No JVD present.  Cardiovascular: Normal rate, regular rhythm, normal heart sounds and intact distal pulses.   Pulmonary/Chest: Effort normal and breath sounds normal. No respiratory distress.  Abdominal: Soft. Bowel sounds are normal.  Musculoskeletal: Normal range of motion. He exhibits no tenderness.  Lymphadenopathy:    He has  no cervical adenopathy.  Neurological: He is alert and oriented to person, place, and time. He displays normal reflexes. No sensory deficit. He exhibits normal muscle tone. Coordination normal.  Facial asymmetry  Skin: Skin is warm and dry. Capillary refill takes less than 2 seconds.  rhinophyma  Psychiatric: He has a normal mood and affect.    Labs reviewed: Basic Metabolic Panel:  Recent Labs  02/22/16 0835  NA 141  K 4.7  CL 108  CO2 27  GLUCOSE 92  BUN 14  CREATININE 1.10  CALCIUM 8.9   Liver Function Tests: No results for input(s): AST, ALT, ALKPHOS, BILITOT, PROT, ALBUMIN in the last 8760 hours. No results for input(s): LIPASE, AMYLASE in the last 8760 hours. No results for input(s): AMMONIA in the last 8760 hours. CBC:  Recent Labs  02/22/16 0835  WBC 3.7*  NEUTROABS 2,294  HGB 12.9*  HCT 38.6  MCV 90.2  PLT 220   Cardiac Enzymes: No results for input(s): CKTOTAL, CKMB, CKMBINDEX, TROPONINI in the last 8760 hours. BNP: Invalid input(s): POCBNP Lab Results  Component Value Date   HGBA1C 5.6 02/22/2016   Assessment/Plan 1. Annual physical exam - performed today - Lipid panel; Future - COMPLETE METABOLIC PANEL WITH GFR; Future  2. Hemorrhoids, unspecified  hemorrhoid type -seems these are not truly bothering him much, but his itchy groin still is b/c he stopped the cream he was to use--resume otc version  -also given names of anusol and preparationH for hemorrhoidal itching and cream  3. COPD GOLD II  -stable, keep f/u with pulmonary due to nodule felt to be scar tissue  4. Essential hypertension, benign -bp at goal, cont same meds and monitor - COMPLETE METABOLIC PANEL WITH GFR; Future  5. Hypercholesterolemia -could not tolerate zocor, lipitor or pravachol - f/u labs - Lipid panel; Future  Labs/tests ordered:   Orders Placed This Encounter  Procedures  . Lipid panel    Standing Status:   Future    Standing Expiration Date:   12/28/2016    Order Specific Question:   Has the patient fasted?    Answer:   Yes  . COMPLETE METABOLIC PANEL WITH GFR    [facility]    Standing Status:   Future    Standing Expiration Date:   12/28/2016   Len Kluver L. Roshad Hack, D.O. [facility] [address], [place] [address] Cell Phone (Mon-Fri 8am-5pm):  [phone] On Call:  [phone] & follow prompts after 5pm & weekends Office Phone:  [phone] Office Fax:  [phone]

## 2016-07-15 ENCOUNTER — Ambulatory Visit (INDEPENDENT_AMBULATORY_CARE_PROVIDER_SITE_OTHER)
Admission: RE | Admit: 2016-07-15 | Discharge: 2016-07-15 | Disposition: A | Discharge status: Home / Self Care | Payer: Medicare HMO | Source: Ambulatory Visit | Attending: Internal Medicine | Admitting: Internal Medicine

## 2016-07-15 DIAGNOSIS — R911 Solitary pulmonary nodule: Secondary | ICD-10-CM | POA: Diagnosis not present

## 2016-09-22 DIAGNOSIS — Z961 Presence of intraocular lens: Secondary | ICD-10-CM | POA: Diagnosis not present

## 2016-09-22 DIAGNOSIS — H11002 Unspecified pterygium of left eye: Secondary | ICD-10-CM | POA: Diagnosis not present

## 2016-09-22 DIAGNOSIS — H04123 Dry eye syndrome of bilateral lacrimal glands: Secondary | ICD-10-CM | POA: Diagnosis not present

## 2016-09-22 DIAGNOSIS — H353131 Nonexudative age-related macular degeneration, bilateral, early dry stage: Secondary | ICD-10-CM | POA: Diagnosis not present

## 2016-11-01 ENCOUNTER — Other Ambulatory Visit: Payer: Medicare HMO

## 2016-11-01 DIAGNOSIS — I10 Essential (primary) hypertension: Secondary | ICD-10-CM

## 2016-11-01 DIAGNOSIS — E7800 Pure hypercholesterolemia, unspecified: Secondary | ICD-10-CM

## 2016-11-01 DIAGNOSIS — Z0000 Encounter for general adult medical examination without abnormal findings: Secondary | ICD-10-CM

## 2016-11-01 LAB — COMPLETE METABOLIC PANEL WITH GFR
ALT: 7 U/L — ABNORMAL LOW (ref 9–46)
AST: 12 U/L (ref 10–35)
Albumin: 3.6 g/dL (ref 3.6–5.1)
Alkaline Phosphatase: 55 U/L (ref 40–115)
BUN: 11 mg/dL (ref 7–25)
CO2: 26 mmol/L (ref 20–31)
Calcium: 8.7 mg/dL (ref 8.6–10.3)
Chloride: 107 mmol/L (ref 98–110)
Creat: 1.2 mg/dL — ABNORMAL HIGH (ref 0.70–1.18)
GFR, Est African American: 67 mL/min (ref >=60)
GFR, Est Non African American: 58 mL/min — ABNORMAL LOW (ref >=60)
Glucose, Bld: 94 mg/dL (ref 65–99)
Potassium: 4.4 mmol/L (ref 3.5–5.3)
Sodium: 141 mmol/L (ref 135–146)
Total Bilirubin: 0.4 mg/dL (ref 0.2–1.2)
Total Protein: 6.1 g/dL (ref 6.1–8.1)

## 2016-11-01 LAB — LIPID PANEL
Cholesterol: 200 mg/dL — ABNORMAL HIGH (ref <200)
HDL: 46 mg/dL (ref >40)
LDL Cholesterol: 138 mg/dL — ABNORMAL HIGH (ref <100)
Total CHOL/HDL Ratio: 4.3 Ratio (ref <5.0)
Triglycerides: 82 mg/dL (ref <150)
VLDL: 16 mg/dL (ref <30)

## 2016-11-03 ENCOUNTER — Encounter: Payer: Self-pay | Admitting: *Deleted

## 2016-11-03 ENCOUNTER — Ambulatory Visit (INDEPENDENT_AMBULATORY_CARE_PROVIDER_SITE_OTHER): Payer: Medicare HMO | Admitting: Internal Medicine

## 2016-11-03 ENCOUNTER — Encounter: Payer: Self-pay | Admitting: Internal Medicine

## 2016-11-03 VITALS — BP 122/60 | HR 54 | Temp 97.6°F | Ht 67.0 in | Wt 137.4 lb

## 2016-11-03 DIAGNOSIS — H3530 Unspecified macular degeneration: Secondary | ICD-10-CM | POA: Diagnosis not present

## 2016-11-03 DIAGNOSIS — I10 Essential (primary) hypertension: Secondary | ICD-10-CM | POA: Diagnosis not present

## 2016-11-03 DIAGNOSIS — E7800 Pure hypercholesterolemia, unspecified: Secondary | ICD-10-CM | POA: Diagnosis not present

## 2016-11-03 DIAGNOSIS — K5901 Slow transit constipation: Secondary | ICD-10-CM

## 2016-11-03 DIAGNOSIS — C4492 Squamous cell carcinoma of skin, unspecified: Secondary | ICD-10-CM | POA: Diagnosis not present

## 2016-11-03 DIAGNOSIS — J449 Chronic obstructive pulmonary disease, unspecified: Secondary | ICD-10-CM

## 2016-11-03 DIAGNOSIS — Z23 Encounter for immunization: Secondary | ICD-10-CM

## 2016-11-03 MED ORDER — ICAPS AREDS 2 PO CAPS: 2.0000 | ORAL_CAPSULE | Freq: Every day | ORAL | 11 refills | Status: DC

## 2016-11-03 MED ORDER — ZOSTER VAC RECOMB ADJUVANTED 50 MCG/0.5ML IM SUSR: 0.5000 mL | Freq: Once | INTRAMUSCULAR | 1 refills | Status: AC

## 2016-11-03 NOTE — Progress Notes (Signed)
Location:  [facility] clinic Provider:  Hriday Stai L. Mariea Clonts, D.O., C.M.D.  Code Status: FULL code Goals of Care:  Advanced Directives 11/03/2016  Does Patient Have a Medical Advance Directive? Yes  Type of Advance Directive Living will  Does patient want to make changes to medical advance directive? -  Copy of [place] in Chart? -  Would patient like information on creating a medical advance directive? -   Chief Complaint  Patient presents with  . Medical Management of Chronic Issues    4 month follow up    HPI: Patient is a 76 y.o. male seen today for medical management of chronic diseases.    Has a weird feeling in his stomach almost like hunger.  Not sore.  Happens before meals.  When he eats, he gets a funny feeling and has to wait before he eats more.  Happening for the past 2 mos.  Only happens now and then.  Has not noticed any particular food correlation.  Not early satiety.  Not losing weight.  No choking or coughing during meals.  He thinks it might be related to being constipated for a 2-3 day period.  Will take a stool softener occasionally, 1-2 works well and he goes.  Discussed fiber and hydration.    Bad cholesterol LDL is down to 138 from 149 last time and 192 in 2016.  Hasn't changed his diet.  Quit smoking in 2012.    He has macular degeneration.  Is now on 2 areds2 capsules--Dr. Katy Fitch.   Is due to go back to his dermatology for skin cancer.  One on head, a couple on his left arm in past. Lyndle Herrlich.   Goes to pulmonary once a year--last seen 07/06/15 by Dr. Melvyn Novas.  No shortness of breath or wheezing,  Feels good.    Has chronic left facial droop.  Cannot feel when food runs down the left side of his face.  He doesn't notice this when he is shaving.  Intermittent numbness of the area--cannot always feel it.  Past Medical History:  Diagnosis Date  . Allergic rhinitis, cause unspecified 09/08/2006  . Carpal tunnel syndrome 03/16/2009  . Chest pain,  unspecified 08/20/2012  . Chest pain, unspecified 12/26/2011  . Concussion, unspecified 10/30/1994  . COPD (chronic obstructive pulmonary disease) ([place]) 02/24/2006  . Corns and callosities 04/11/2007  . Dermatophytosis of the body   . Disturbance of skin sensation   . Diverticulosis of colon (without mention of hemorrhage) 06/14/2010  . Edema of male genital organs 08/20/2012  . Elevated blood pressure reading without diagnosis of hypertension 06/14/2010  . Encounter for long-term (current) use of other medications   . Hypercholesterolemia   . Hypertrophy of prostate without urinary obstruction and other lower urinary tract symptoms (LUTS)   . 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 Lumbago 05/11/2009  . Nocturia 08/20/2012  . Other and unspecified hyperlipidemia 02/24/2006  . Pain in joint, ankle and foot 04/11/2007  . Pterygium, unspecified 03/01/2006  . Rash and other nonspecific skin eruption 12/28/1999  . Seborrheic dermatitis, unspecified 03/21/2001  . 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 Tobacco use disorder   . Ulcer of other part of foot 03/16/2007  . Unspecified constipation 05/11/2009  . Urinary tract infection, site not specified 05/11/2009  . Ventral hernia, unspecified, without mention of obstruction or gangrene     Past Surgical History:  Procedure Laterality Date  . ELBOW SURGERY  Right   . EYE SURGERY     Cataract Removal  . FRACTURE SURGERY  1986   right hip, right elbow  . KNEE SURGERY Right 01/2007   knee cap injury  . SKIN CANCER EXCISION  07/2014   Back and head   . TONSILLECTOMY  1948    Allergies  Allergen Reactions  . Zocor [Simvastatin] Shortness Of Breath    Sob and itching  . Ace Inhibitors Cough    Tolerating ARB instead  . Lipitor [Atorvastatin]     Muscle and bone pain  . Pravachol [Pravastatin Sodium] Other (See Comments)    myalgias    Allergies as of 11/03/2016      Reactions    Zocor [simvastatin] Shortness Of Breath   Sob and itching   Ace Inhibitors Cough   Tolerating ARB instead   Lipitor [atorvastatin]    Muscle and bone pain   Pravachol [pravastatin Sodium] Other (See Comments)   myalgias      Medication List       Accurate as of 11/03/16  8:17 AM. Always use your most recent med list.          aspirin 81 MG tablet Take 81 mg by mouth daily. Take one tablet once a day   ibuprofen 200 MG tablet Commonly known as:  ADVIL,MOTRIN Take 400 mg by mouth every 4 (four) hours as needed for fever, headache, mild pain, moderate pain or cramping.   tamsulosin 0.4 MG Caps capsule Commonly known as:  FLOMAX Take 1 capsule (0.4 mg total) by mouth daily.   valsartan 80 MG tablet Commonly known as:  DIOVAN Take 1 tablet (80 mg total) by mouth daily.       Review of Systems:  Review of Systems  Constitutional: Negative for chills, fever and malaise/fatigue.  HENT: Positive for hearing loss. Negative for congestion.   Eyes: Negative for blurred vision.       Bothered by sun since cataract surgery; has newly diagnosed macular degeneration on areds now  Respiratory: Negative for cough and shortness of breath.   Cardiovascular: Negative for chest pain, palpitations and leg swelling.  Gastrointestinal: Positive for constipation. Negative for abdominal pain, blood in stool, diarrhea, heartburn, melena, nausea and vomiting.       See hpi  Genitourinary: Negative for dysuria.  Musculoskeletal: Negative for falls and myalgias.  Skin: Negative for itching and rash.  Neurological: Positive for tingling and sensory change. Negative for dizziness, loss of consciousness and weakness.       Chronic left facial droop, but motor seems to be intact; sensation comes and goes  Psychiatric/Behavioral: Negative for depression and memory loss. The patient does not have insomnia.     Health Maintenance  Topic Date Due  . INFLUENZA VACCINE  01/04/2017  . TETANUS/TDAP   12/04/2023  . PNA vac Low Risk Adult  Completed    Physical Exam: Vitals:   11/03/16 0807  BP: 122/60  Pulse: (!) 54  Temp: 97.6 F (36.4 C)  SpO2: 97%  Weight: 137 lb 6.4 oz (62.3 kg)  Height: 5\' 7"  (1.702 m)   Body mass index is 21.52 kg/m. Physical Exam  Constitutional: He is oriented to person, place, and time. No distress.  Cardiovascular: Normal rate, regular rhythm, normal heart sounds and intact distal pulses.   Pulmonary/Chest: Effort normal and breath sounds normal. No respiratory distress. He has no wheezes.  Abdominal: Soft. Bowel sounds are normal. He exhibits no distension. There is no tenderness.  Musculoskeletal: Normal range of motion.  Neurological: He is alert and oriented to person, place, and time.  Left facial droop but able to smile, puff cheeks and raise eyebrows, some sensory loss off and on for years  Skin: Skin is warm and dry.  Psychiatric: He has a normal mood and affect.    Labs reviewed: Basic Metabolic Panel:  Recent Labs  02/22/16 0835 11/01/16 0807  NA 141 141  K 4.7 4.4  CL 108 107  CO2 27 26  GLUCOSE 92 94  BUN 14 11  CREATININE 1.10 1.20*  CALCIUM 8.9 8.7   Liver Function Tests:  Recent Labs  11/01/16 0807  AST 12  ALT 7*  ALKPHOS 55  BILITOT 0.4  PROT 6.1  ALBUMIN 3.6   No results for input(s): LIPASE, AMYLASE in the last 8760 hours. No results for input(s): AMMONIA in the last 8760 hours. CBC:  Recent Labs  02/22/16 0835  WBC 3.7*  NEUTROABS 2,294  HGB 12.9*  HCT 38.6  MCV 90.2  PLT 220   Lipid Panel:  Recent Labs  02/22/16 0835 11/01/16 0807  CHOL 214* 200*  HDL 51 46  LDLCALC 149* 138*  TRIG 68 82  CHOLHDL 4.2 4.3   Lab Results  Component Value Date   HGBA1C 5.6 02/22/2016    Assessment/Plan 1. Constipation, slow transit -ongoing, cont stool softener as needed  2. COPD GOLD II  -cont annual f/u with pulmonary, not using any inhalers or nebs, no wheezing, no dyspnea today  3.  Essential hypertension, benign -bp well controlled on valsartan  4. Hypercholesterolemia -lipids still not at goal, refuses to take pills for it and did not tolerate statins, does not want injections either  5. Macular degeneration of both eyes, unspecified type -cont areds2 but take 2 capsules as directed not just one  6. Squamous cell skin cancer -f/u with dermatology for annual skin exam--no referral needed  7. Need for shingles vaccine - Zoster Vac Recomb Adjuvanted [facility]) injection; Inject 0.5 mLs into the muscle once.  Dispense: 0.5 mL; Refill: 1 -sent to pharmacy   Labs/tests ordered:  No orders of the defined types were placed in this encounter.  Next appt:  03/09/2017   Cortnee Steinmiller L. Glorious Flicker, D.O. [facility] [address],  [address] Cell Phone (Mon-Fri 8am-5pm):  [phone] On Call:  [phone] & follow prompts after 5pm & weekends Office Phone:  [phone] Office Fax:  [phone]

## 2016-12-28 ENCOUNTER — Ambulatory Visit: Payer: Medicare HMO | Admitting: Internal Medicine

## 2017-01-23 DIAGNOSIS — L814 Other melanin hyperpigmentation: Secondary | ICD-10-CM | POA: Diagnosis not present

## 2017-01-23 DIAGNOSIS — D485 Neoplasm of uncertain behavior of skin: Secondary | ICD-10-CM | POA: Diagnosis not present

## 2017-01-23 DIAGNOSIS — D225 Melanocytic nevi of trunk: Secondary | ICD-10-CM | POA: Diagnosis not present

## 2017-01-23 DIAGNOSIS — L821 Other seborrheic keratosis: Secondary | ICD-10-CM | POA: Diagnosis not present

## 2017-03-09 ENCOUNTER — Encounter: Payer: Self-pay | Admitting: Internal Medicine

## 2017-03-09 ENCOUNTER — Other Ambulatory Visit: Payer: Self-pay

## 2017-03-09 ENCOUNTER — Ambulatory Visit (INDEPENDENT_AMBULATORY_CARE_PROVIDER_SITE_OTHER): Payer: Medicare HMO | Admitting: Internal Medicine

## 2017-03-09 VITALS — BP 122/70 | HR 56 | Temp 97.5°F | Wt 137.0 lb

## 2017-03-09 DIAGNOSIS — Z8711 Personal history of peptic ulcer disease: Secondary | ICD-10-CM

## 2017-03-09 DIAGNOSIS — J449 Chronic obstructive pulmonary disease, unspecified: Secondary | ICD-10-CM

## 2017-03-09 DIAGNOSIS — K5901 Slow transit constipation: Secondary | ICD-10-CM | POA: Diagnosis not present

## 2017-03-09 DIAGNOSIS — Z23 Encounter for immunization: Secondary | ICD-10-CM

## 2017-03-09 DIAGNOSIS — Z86010 Personal history of colonic polyps: Secondary | ICD-10-CM | POA: Diagnosis not present

## 2017-03-09 DIAGNOSIS — K5790 Diverticulosis of intestine, part unspecified, without perforation or abscess without bleeding: Secondary | ICD-10-CM

## 2017-03-09 MED ORDER — OMEPRAZOLE 20 MG PO CPDR
20.0000 mg | DELAYED_RELEASE_CAPSULE | Freq: Two times a day (BID) | ORAL | 3 refills | Status: DC
Start: 2017-03-09 — End: 2017-09-04

## 2017-03-09 NOTE — Addendum Note (Signed)
Addended by: Despina Hidden on: 03/09/2017 10:56 AM   Modules accepted: Orders

## 2017-03-09 NOTE — Addendum Note (Signed)
Addended by: Denyse Amass on: 03/09/2017 10:31 AM   Modules accepted: Orders

## 2017-03-09 NOTE — Progress Notes (Signed)
Location:  [facility] clinic Provider:  Louis Ivery L. Mariea Clonts, D.O., C.M.D.  Code Status: FULL Goals of Care:  Advanced Directives 03/09/2017  Does Patient Have a Medical Advance Directive? Yes  Type of Advance Directive Living will  Does patient want to make changes to medical advance directive? -  Copy of [place] in Chart? -  Would patient like information on creating a medical advance directive? -   Chief Complaint  Patient presents with  . Medical Management of Chronic Issues    60mth follow-up, abdominal pain x4 mths getting worse    HPI: Patient is a 76 y.o. male seen today for medical management of chronic diseases.    He now reports he's been having abdominal pain for the past 4 months that is getting worse.  At the last appt over 4 mos ago he said he had a hunger pang type belly pain before meals.  It was occurring occasionally and happened after he ate, too.  He then told me he thought it was due to constipation for 2-3 days.  He was using a stool softener and we discussed fiber and hydration.  Now it's worse when he lays down and better when he gets up.  Now it's bothering him in his chest.  He does not c/o indigestion very much--did have once last week with diarrhea after.  Does not drink water and did not increase fiber.  His wife is here and says he's hardheaded like his mama.  Eats dinner at 5-6 and goes to bed at 9:30-10pm.  Has bilateral reducible inguinal hernias.  The pain might happen once in 2 wks.  Also does not eat regularly.  Gets busy and misses meals.  Rarely takes advil--says he took it once yesterday, but normally does not.  He did have stomach ulcers years ago.  He also had diverticular disease when he last saw Dr. Watt Climes.    He says he went to the lung specialist, the nodules were said to be scar tissue.  He's coughing up clear mucus.  He is worried more about this--happens early am and late at night.  Discussed he has COPD diagnosis.  His wife notices his  breathing is not what it used to be.  Says he can't run around like he used to.  Used to be able to mow the lawn with a hill and now he has to stop a few times.    He agreed to flu shot.  Past Medical History:  Diagnosis Date  . Allergic rhinitis, cause unspecified 09/08/2006  . Carpal tunnel syndrome 03/16/2009  . Chest pain, unspecified 08/20/2012  . Chest pain, unspecified 12/26/2011  . Concussion, unspecified 10/30/1994  . COPD (chronic obstructive pulmonary disease) ([place]) 02/24/2006  . Corns and callosities 04/11/2007  . Dermatophytosis of the body   . Disturbance of skin sensation   . Diverticulosis of colon (without mention of hemorrhage) 06/14/2010  . Edema of male genital organs 08/20/2012  . Elevated blood pressure reading without diagnosis of hypertension 06/14/2010  . Encounter for long-term (current) use of other medications   . Hypercholesterolemia   . Hypertrophy of prostate without urinary obstruction and other lower urinary tract symptoms (LUTS)   . 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 Lumbago 05/11/2009  . Nocturia 08/20/2012  . Other and unspecified hyperlipidemia 02/24/2006  . Pain in joint, ankle and foot 04/11/2007  . Pterygium, unspecified 03/01/2006  . Rash and other nonspecific skin eruption 12/28/1999  . Seborrheic dermatitis, unspecified  03/21/2001  . 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 Tobacco use disorder   . Ulcer of other part of foot 03/16/2007  . Unspecified constipation 05/11/2009  . Urinary tract infection, site not specified 05/11/2009  . Ventral hernia, unspecified, without mention of obstruction or gangrene     Past Surgical History:  Procedure Laterality Date  . ELBOW SURGERY Right   . EYE SURGERY     Cataract Removal  . FRACTURE SURGERY  1986   right hip, right elbow  . KNEE SURGERY Right 01/2007   knee cap injury  . SKIN CANCER EXCISION  07/2014   Back and head   .  TONSILLECTOMY  1948    Allergies  Allergen Reactions  . Zocor [Simvastatin] Shortness Of Breath    Sob and itching  . Ace Inhibitors Cough    Tolerating ARB instead  . Lipitor [Atorvastatin]     Muscle and bone pain  . Pravachol [Pravastatin Sodium] Other (See Comments)    myalgias    Outpatient Encounter Prescriptions as of 03/09/2017  Medication Sig  . aspirin 81 MG tablet Take 81 mg by mouth daily. Take one tablet once a day  . ibuprofen (ADVIL,MOTRIN) 200 MG tablet Take 400 mg by mouth every 4 (four) hours as needed for fever, headache, mild pain, moderate pain or cramping.  . Multiple Vitamins-Minerals (ICAPS AREDS 2) CAPS Take 2 capsules by mouth daily.  . tamsulosin (FLOMAX) 0.4 MG CAPS capsule Take 1 capsule (0.4 mg total) by mouth daily.  . valsartan (DIOVAN) 80 MG tablet Take 1 tablet (80 mg total) by mouth daily.   No facility-administered encounter medications on file as of 03/09/2017.     Review of Systems:  Review of Systems  Constitutional: Negative for chills, fever and malaise/fatigue.  HENT: Positive for hearing loss.   Eyes: Negative for blurred vision.  Respiratory: Negative for cough and shortness of breath.   Cardiovascular: Positive for chest pain. Negative for palpitations and leg swelling.  Gastrointestinal: Positive for abdominal pain, constipation and heartburn. Negative for blood in stool, diarrhea, melena, nausea and vomiting.  Genitourinary: Negative for dysuria.  Musculoskeletal: Negative for falls.  Skin: Negative for itching and rash.  Neurological: Negative for dizziness and loss of consciousness.  Endo/Heme/Allergies: Does not bruise/bleed easily.  Psychiatric/Behavioral: Negative for depression and memory loss. The patient does not have insomnia.     Health Maintenance  Topic Date Due  . INFLUENZA VACCINE  01/04/2017  . TETANUS/TDAP  12/04/2023  . PNA vac Low Risk Adult  Completed    Physical Exam: Vitals:   03/09/17 0801  BP:  122/70  Pulse: (!) 56  Temp: (!) 97.5 F (36.4 C)  TempSrc: Oral  SpO2: 96%  Weight: 137 lb (62.1 kg)   Body mass index is 21.46 kg/m. Physical Exam  Constitutional: He is oriented to person, place, and time. No distress.  HENT:  Head: Normocephalic and atraumatic.  Cardiovascular: Normal rate, regular rhythm, normal heart sounds and intact distal pulses.   Pulmonary/Chest: Effort normal and breath sounds normal. No respiratory distress. He has no wheezes.  Abdominal: Soft. Bowel sounds are normal. He exhibits no distension. There is tenderness. There is no guarding. A hernia is present.  Tender in epigastric area and RLQ; bilateral inguinal hernias, reducible  Musculoskeletal: Normal range of motion.  Neurological: He is alert and oriented to person, place, and time.  Repeats questions multiple times in visit (wife says he's  hardheaded)  Skin: Skin is warm and dry.  Psychiatric: He has a normal mood and affect.    Labs reviewed: Basic Metabolic Panel:  Recent Labs  11/01/16 0807  NA 141  K 4.4  CL 107  CO2 26  GLUCOSE 94  BUN 11  CREATININE 1.20*  CALCIUM 8.7   Liver Function Tests:  Recent Labs  11/01/16 0807  AST 12  ALT 7*  ALKPHOS 55  BILITOT 0.4  PROT 6.1  ALBUMIN 3.6   No results for input(s): LIPASE, AMYLASE in the last 8760 hours. No results for input(s): AMMONIA in the last 8760 hours. CBC: No results for input(s): WBC, NEUTROABS, HGB, HCT, MCV, PLT in the last 8760 hours. Lipid Panel:  Recent Labs  11/01/16 0807  CHOL 200*  HDL 46  LDLCALC 138*  TRIG 82  CHOLHDL 4.3   Lab Results  Component Value Date   HGBA1C 5.6 02/22/2016    Assessment/Plan 1. H/O peptic ulcer - per pt's wife - Ambulatory referral to Gastroenterology - Helicobacter pylori special antigen - omeprazole (PRILOSEC) 20 MG capsule; Take 1 capsule (20 mg total) by mouth 2 (two) times daily before a meal.  Dispense: 60 capsule; Refill: 3 -ulcers never mentioned to me  in 6+ years by pt -will treat for acid reflux with prilosec bid before meals and arrange to see Dr. Watt Climes who saw him years ago and did his cscopes where he had polyps -avoid the ibuprofen/advil (rarely used though), does not drink alcohol  2. Diverticulosis of intestine without bleeding, unspecified intestinal tract location - in history, pt follows a very low fiber diet based on his preferences which is not helpful--eats hamburgers, fries, etc,  - Ambulatory referral to Gastroenterology  3. Personal history of colonic polyps - Ambulatory referral to Gastroenterology  4. COPD GOLD II  -has some degree of chronic cough with clear sputum -nodule study not due until February and f/u with Dr. Melvyn Novas after it  5. Constipation, slow transit -counseled again about increased fiber and WATER in diet--drinks coffee and soda and eats processed foods a lot--encouraged salads which his wife says he will eat sometimes  6. Need for immunization against influenza -given - Flu vaccine HIGH DOSE PF (Fluzone High dose)  Labs/tests ordered:   Orders Placed This Encounter  Procedures  . Helicobacter pylori special antigen  . Flu vaccine HIGH DOSE PF (Fluzone High dose)  . Ambulatory referral to Gastroenterology    Referral Priority:   Routine    Referral Type:   Consultation    Referral Reason:   Specialty Services Required    Referred to Provider:   Clarene Essex, MD    Requested Specialty:   Gastroenterology    Number of Visits Requested:   1    Next appt:  4 mos med mgt  Nathalia Wismer L. Brieann Osinski, D.O. [facility] [address], [place] [address] Cell Phone (Mon-Fri 8am-5pm):  [phone] On Call:  [phone] & follow prompts after 5pm & weekends Office Phone:  [phone] Office Fax:  [phone]

## 2017-03-09 NOTE — Patient Instructions (Signed)
Constipation, Adult Constipation is when a person:  Poops (has a bowel movement) fewer times in a week than normal.  Has a hard time pooping.  Has poop that is dry, hard, or bigger than normal.  Follow these instructions at home: Eating and drinking   Eat foods that have a lot of fiber, such as: ? Fresh fruits and vegetables. ? Whole grains. ? Beans.  Eat less of foods that are high in fat, low in fiber, or overly processed, such as: ? [place] fries. ? Hamburgers. ? Cookies. ? Candy. ? Soda.  Drink enough fluid to keep your pee (urine) clear or pale yellow. General instructions  Exercise regularly or as told by your doctor.  Go to the restroom when you feel like you need to poop. Do not hold it in.  Take over-the-counter and prescription medicines only as told by your doctor. These include any fiber supplements.  Do pelvic floor retraining exercises, such as: ? Doing deep breathing while relaxing your lower belly (abdomen). ? Relaxing your pelvic floor while pooping.  Watch your condition for any changes.  Keep all follow-up visits as told by your doctor. This is important. Contact a doctor if:  You have pain that gets worse.  You have a fever.  You have not pooped for 4 days.  You throw up (vomit).  You are not hungry.  You lose weight.  You are bleeding from the anus.  You have thin, pencil-like poop (stool). Get help right away if:  You have a fever, and your symptoms suddenly get worse.  You leak poop or have blood in your poop.  Your belly feels hard or bigger than normal (is bloated).  You have very bad belly pain.  You feel dizzy or you faint. This information is not intended to replace advice given to you by your health care provider. Make sure you discuss any questions you have with your health care provider. Document Released: 11/09/2007 Document Revised: 12/11/2015 Document Reviewed: 11/11/2015 Elsevier Interactive Patient Education   2017 [place] for Gastroesophageal Reflux Disease, Adult When you have gastroesophageal reflux disease (GERD), the foods you eat and your eating habits are very important. Choosing the right foods can help ease the discomfort of GERD. Consider working with a diet and nutrition specialist (dietitian) to help you make healthy food choices. What general guidelines should I follow? Eating plan  Choose healthy foods low in fat, such as fruits, vegetables, whole grains, low-fat dairy products, and lean meat, fish, and poultry.  Eat frequent, small meals instead of three large meals each day. Eat your meals slowly, in a relaxed setting. Avoid bending over or lying down until 2-3 hours after eating.  Limit high-fat foods such as fatty meats or fried foods.  Limit your intake of oils, butter, and shortening to less than 8 teaspoons each day.  Avoid the following: ? Foods that cause symptoms. These may be different for different people. Keep a food diary to keep track of foods that cause symptoms. ? Alcohol. ? Drinking large amounts of liquid with meals. ? Eating meals during the 2-3 hours before bed.  Cook foods using methods other than frying. This may include baking, grilling, or broiling. Lifestyle   Maintain a healthy weight. Ask your health care provider what weight is healthy for you. If you need to lose weight, work with your health care provider to do so safely.  Exercise for at least 30 minutes on 5 or more days  each week, or as told by your health care provider.  Avoid wearing clothes that fit tightly around your waist and chest.  Do not use any products that contain nicotine or tobacco, such as cigarettes and e-cigarettes. If you need help quitting, ask your health care provider.  Sleep with the head of your bed raised. Use a wedge under the mattress or blocks under the bed frame to raise the head of the bed. What foods are not recommended? The items listed may  not be a complete list. Talk with your dietitian about what dietary choices are best for you. Grains Pastries or quick breads with added fat. [place] toast. Vegetables Deep fried vegetables. [place] fries. Any vegetables prepared with added fat. Any vegetables that cause symptoms. For some people this may include tomatoes and tomato products, chili peppers, onions and garlic, and horseradish. Fruits Any fruits prepared with added fat. Any fruits that cause symptoms. For some people this may include citrus fruits, such as oranges, grapefruit, pineapple, and lemons. Meats and other protein foods High-fat meats, such as fatty beef or pork, hot dogs, ribs, ham, sausage, salami and bacon. Fried meat or protein, including fried fish and fried chicken. Nuts and nut butters. Dairy Whole milk and chocolate milk. Sour cream. Cream. Ice cream. Cream cheese. Milk shakes. Beverages Coffee and tea, with or without caffeine. Carbonated beverages. Sodas. Energy drinks. Fruit juice made with acidic fruits (such as orange or grapefruit). Tomato juice. Alcoholic drinks. Fats and oils Butter. Margarine. Shortening. Ghee. Sweets and desserts Chocolate and cocoa. Donuts. Seasoning and other foods Pepper. Peppermint and spearmint. Any condiments, herbs, or seasonings that cause symptoms. For some people, this may include curry, hot sauce, or vinegar-based salad dressings. Summary  When you have gastroesophageal reflux disease (GERD), food and lifestyle choices are very important to help ease the discomfort of GERD.  Eat frequent, small meals instead of three large meals each day. Eat your meals slowly, in a relaxed setting. Avoid bending over or lying down until 2-3 hours after eating.  Limit high-fat foods such as fatty meat or fried foods. This information is not intended to replace advice given to you by your health care provider. Make sure you discuss any questions you have with your health care  provider. Document Released: 05/23/2005 Document Revised: 05/24/2016 Document Reviewed: 05/24/2016 Elsevier Interactive Patient Education  2017 [employer].

## 2017-03-10 LAB — H. PYLORI BREATH TEST: H. pylori Breath Test: NOT DETECTED

## 2017-04-11 ENCOUNTER — Other Ambulatory Visit: Payer: Self-pay | Admitting: Gastroenterology

## 2017-04-11 DIAGNOSIS — R1013 Epigastric pain: Secondary | ICD-10-CM

## 2017-04-13 ENCOUNTER — Ambulatory Visit
Admission: RE | Admit: 2017-04-13 | Discharge: 2017-04-13 | Disposition: A | Discharge status: Home / Self Care | Payer: Medicare HMO | Source: Ambulatory Visit | Attending: Gastroenterology | Admitting: Gastroenterology

## 2017-04-13 DIAGNOSIS — K5730 Diverticulosis of large intestine without perforation or abscess without bleeding: Secondary | ICD-10-CM | POA: Diagnosis not present

## 2017-04-13 DIAGNOSIS — R1013 Epigastric pain: Secondary | ICD-10-CM

## 2017-04-13 MED ORDER — IOPAMIDOL (ISOVUE-300) INJECTION 61%: 100.0000 mL | Freq: Once | INTRAVENOUS | Status: DC | PRN

## 2017-04-24 DIAGNOSIS — K222 Esophageal obstruction: Secondary | ICD-10-CM | POA: Diagnosis not present

## 2017-04-24 DIAGNOSIS — R1013 Epigastric pain: Secondary | ICD-10-CM | POA: Diagnosis not present

## 2017-04-24 DIAGNOSIS — K449 Diaphragmatic hernia without obstruction or gangrene: Secondary | ICD-10-CM | POA: Diagnosis not present

## 2017-04-24 DIAGNOSIS — K3189 Other diseases of stomach and duodenum: Secondary | ICD-10-CM | POA: Diagnosis not present

## 2017-05-28 IMAGING — CT CT CHEST W/O CM
3 of 4 series · 16 of 30 positions shown, 18 images · non-contrast
Comparison: Chest radiograph, 02/22/2015

CLINICAL DATA: Followup for abnormal chest radiograph showing a
focal density at the right apex.

EXAM:
CT CHEST WITHOUT CONTRAST
TECHNIQUE: Multidetector CT imaging of the chest was performed following the
standard protocol without IV contrast.

[Series 3: chest w/o · axial · non-contrast · 0.68mm/px · z∈[-260,-30]mm · 5 of 70 slices shown, 7 images]
[im 12/70  mediastinal]
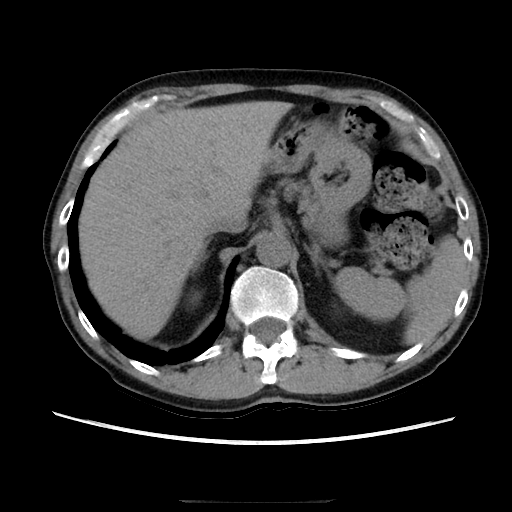
[im 12/70  lung]
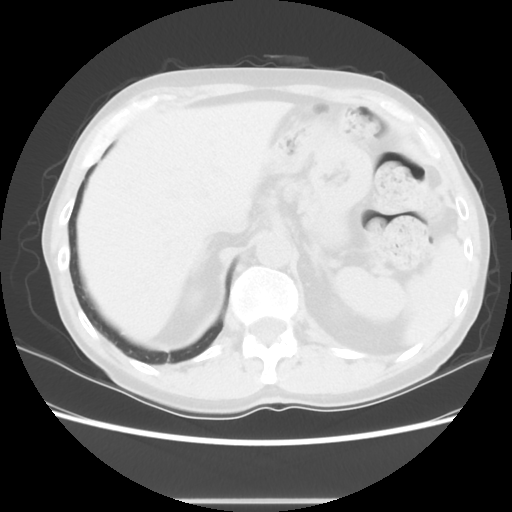
[im 24/70  lung]
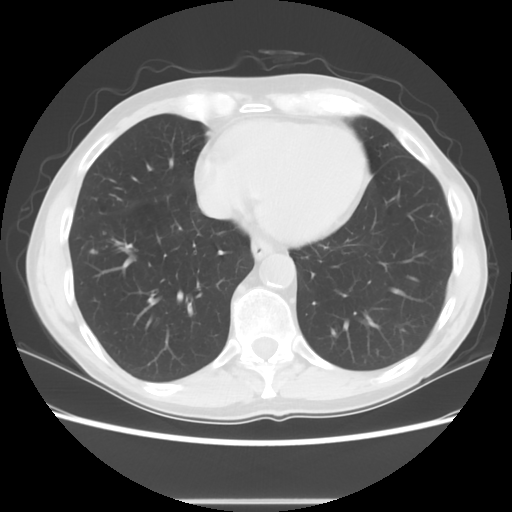
[im 35/70  lung]
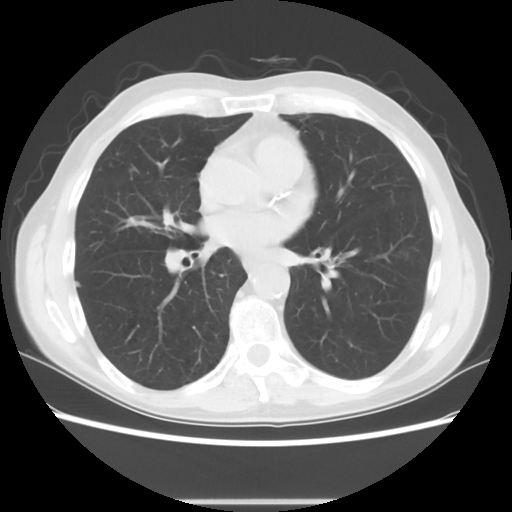
[im 47/70  lung]
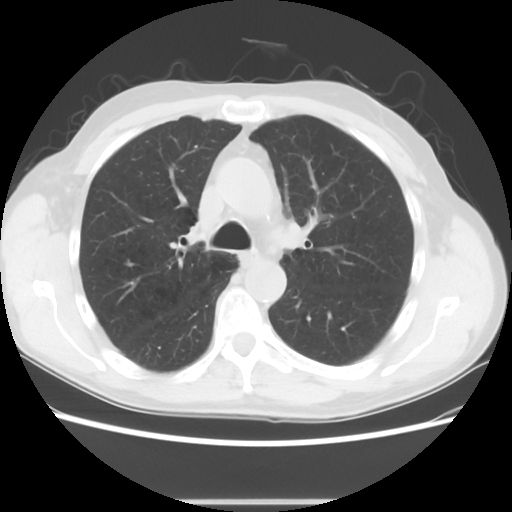
[im 58/70  mediastinal]
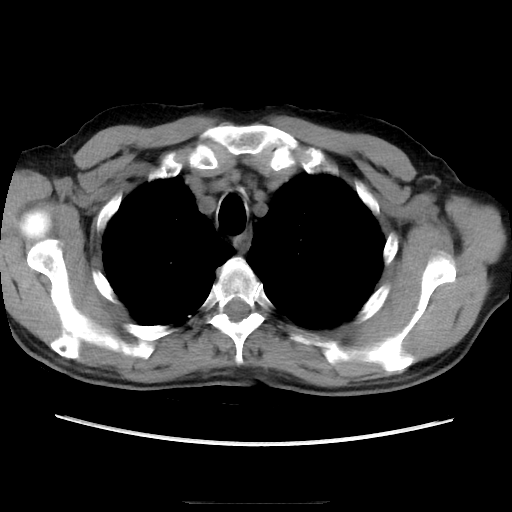
[im 58/70  lung]
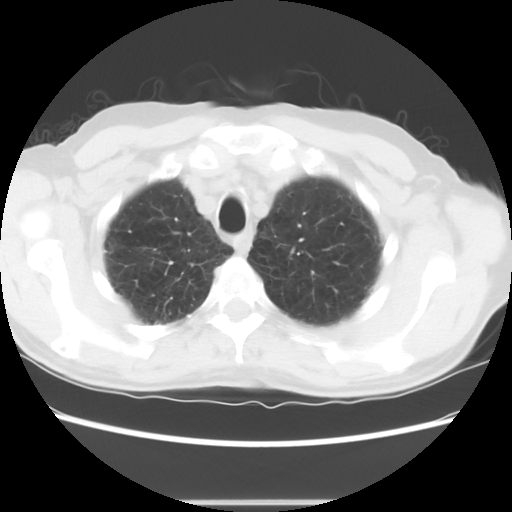

[Series 4: lung windows · axial · 0.68mm/px · z∈[-260,-25]mm · 5 of 71 slices shown]
[im 12/71  lung]
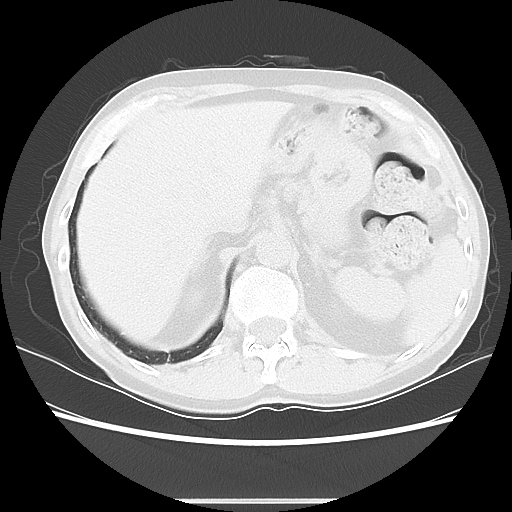
[im 24/71  lung]
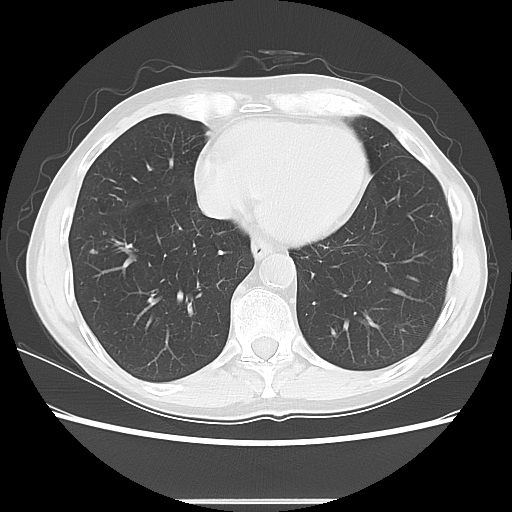
[im 36/71  lung]
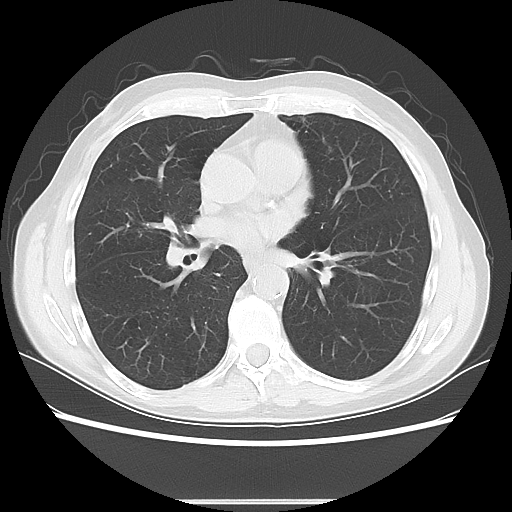
[im 47/71  lung]
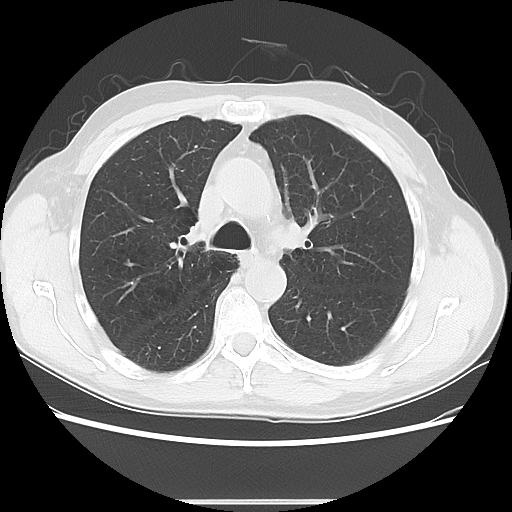
[im 59/71  lung]
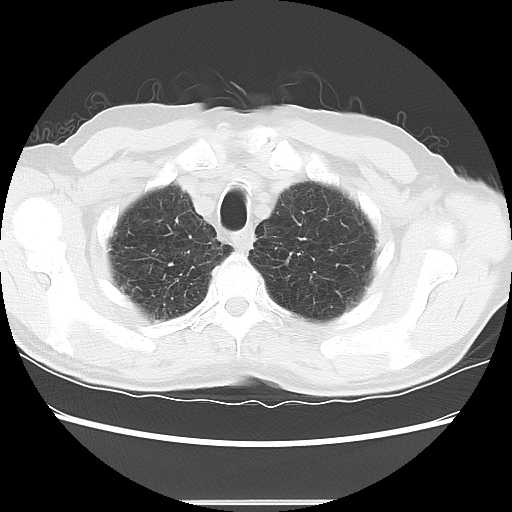

[Series 602: sagittal body · sagittal · 0.69mm/px · 6 of 140 slices shown]
[im 11/140  mediastinal]
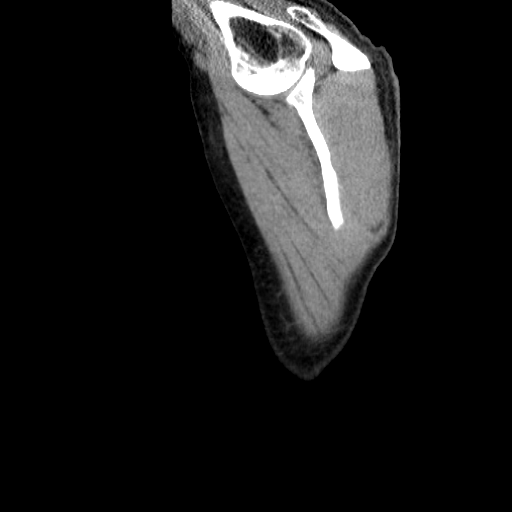
[im 33/140  mediastinal]
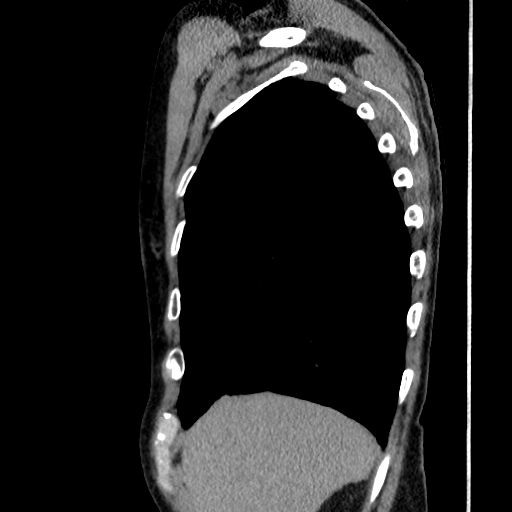
[im 43/140  mediastinal]
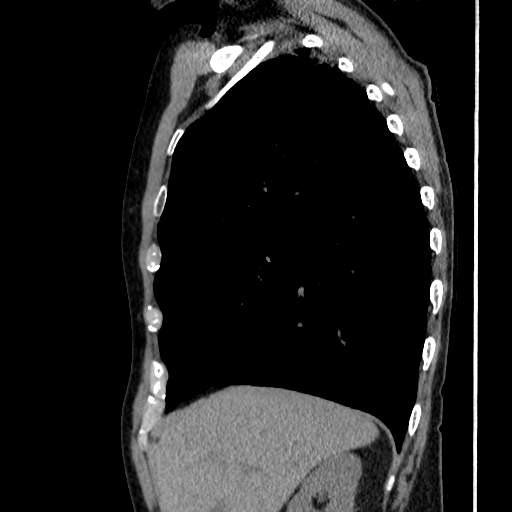
[im 65/140  mediastinal]
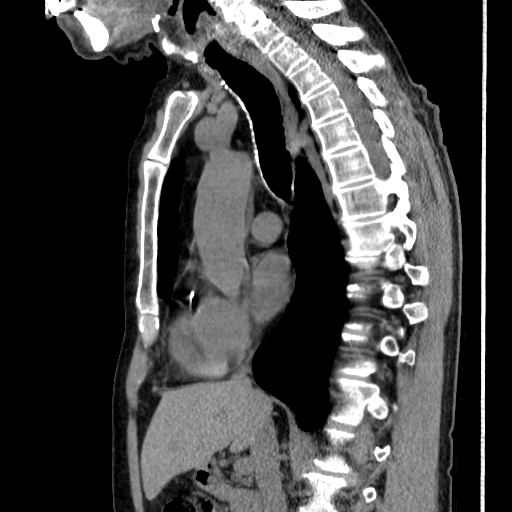
[im 75/140  mediastinal]
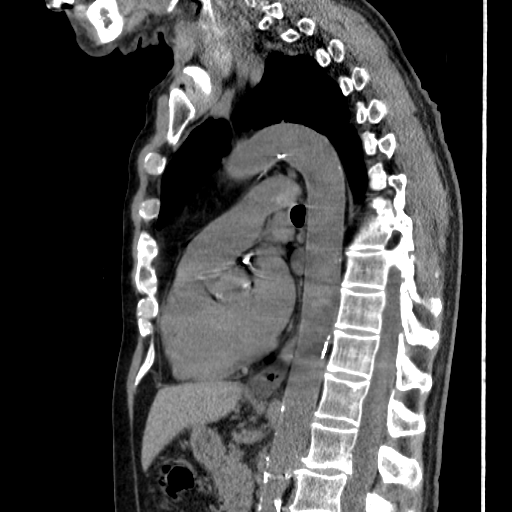
[im 97/140  mediastinal]
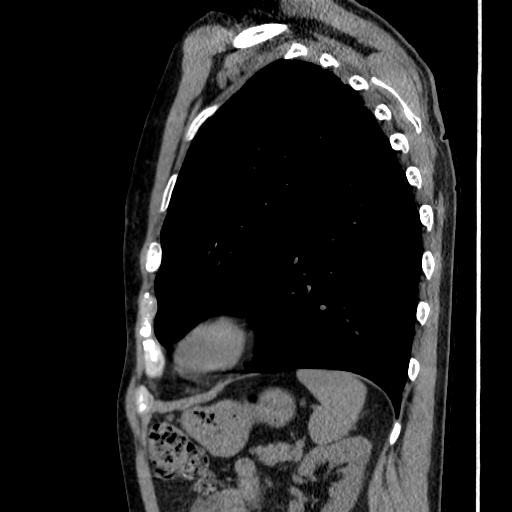

[16 of 30 positions shown; findings below may reference images not displayed]

FINDINGS: Thoracic inlet:  No mass or adenopathy.  Normal thyroid.

Mediastinum and hila: Heart normal in size and configuration. There
are moderate coronary artery calcifications. Mild prominence of the
ascending aorta measuring 3.5 cm in diameter. No mediastinal or
hilar masses or pathologically enlarged lymph nodes.

Lungs and pleura: There is focal opacity in the posterior right
upper lobe at the apex consistent with the opacity seen on the chest
radiograph. On the coronal view, this measures 12 mm greatest
dimension. This is confluent with additional opacity that extends
over the superior margin of the right apex, most likely all due to
pleural parenchymal scarring. Minor scarring is noted at the left
apex. There are also multiple small pulmonary nodules. There is a 6
mm nodule the diaphragmatic base of the left lower lobe. 5 mm nodule
lies in the right middle lobe adjacent to the minor fissure with an
adjacent 4 mm nodule. There multiple additional nodules which
measure 4 mm or less. Lungs show changes of centrilobular emphysema
in the upper lobes. There is an area focal emphysema in the
posterior right lower lobe. No lung consolidation or edema. No
pleural effusion or pneumothorax.

Limited upper abdomen:  Unremarkable.

Musculoskeletal:  No osteoblastic or osteolytic lesions.
IMPRESSION: 1. The focal opacity noted at the right apex on current chest
radiograph is most likely due to pleural parenchymal scarring.
Neoplastic disease, however, is not excluded. Recommend followup
unenhanced chest CT in 3 months to assess for stability.
2. The multiple small pulmonary nodules as described.

## 2017-06-12 ENCOUNTER — Other Ambulatory Visit: Payer: Self-pay | Admitting: Internal Medicine

## 2017-06-12 DIAGNOSIS — R911 Solitary pulmonary nodule: Secondary | ICD-10-CM

## 2017-06-15 ENCOUNTER — Other Ambulatory Visit: Payer: Self-pay | Admitting: Internal Medicine

## 2017-07-10 ENCOUNTER — Ambulatory Visit (INDEPENDENT_AMBULATORY_CARE_PROVIDER_SITE_OTHER): Payer: Medicare HMO | Admitting: Internal Medicine

## 2017-07-10 ENCOUNTER — Encounter: Payer: Self-pay | Admitting: Internal Medicine

## 2017-07-10 VITALS — BP 138/80 | HR 73 | Temp 97.4°F | Ht 67.0 in | Wt 144.0 lb

## 2017-07-10 DIAGNOSIS — J449 Chronic obstructive pulmonary disease, unspecified: Secondary | ICD-10-CM | POA: Diagnosis not present

## 2017-07-10 DIAGNOSIS — E7800 Pure hypercholesterolemia, unspecified: Secondary | ICD-10-CM | POA: Diagnosis not present

## 2017-07-10 DIAGNOSIS — K4021 Bilateral inguinal hernia, without obstruction or gangrene, recurrent: Secondary | ICD-10-CM | POA: Diagnosis not present

## 2017-07-10 DIAGNOSIS — I10 Essential (primary) hypertension: Secondary | ICD-10-CM

## 2017-07-10 DIAGNOSIS — K222 Esophageal obstruction: Secondary | ICD-10-CM

## 2017-07-10 NOTE — Progress Notes (Signed)
Location:  [facility] clinic Provider:  Tiffany L. Mariea Clonts, D.O., C.M.D.  Code Status: Full  Goals of Care:  Advanced Directives 07/10/2017  Does Patient Have a Medical Advance Directive? Yes  Type of Advance Directive Living will  Does patient want to make changes to medical advance directive? No - Patient declined  Copy of [place] in Chart? -  Would patient like information on creating a medical advance directive? -     Chief Complaint  Patient presents with  . Medical Management of Chronic Issues    44mth follow-up  . has living will    HPI: Patient is a 77 y.o. male seen today for medical management of chronic diseases.   Overall feeling well today in office. He has improved with his abdominal pain, he was found to have an esophageal stricture (benign) on his Upper Endo in Nov 2018. He was placed on Prilosec in Oct 20 mg BID, he has been taking it only once as he said once daily helps him.   COPD: He goes to the pulmonologist next week for his yearly check-up. He denies shortness of breath, wheezing, and activity intolerance.  Hypertension: Today BP in office: 138/80. Recheck 140/80. His home blood pressure ranges 120's (SBP). He takes his valsartan at night. He denies chest pain, vision changes, or headaches. Reports taking his medicine daily and not missing pills.   Inguinal Hernia: Reducible as previously discussed. He states it has come out once and it took a while but it went back. He denies color changes or pain associated with that event.    Past Medical History:  Diagnosis Date  . Allergic rhinitis, cause unspecified 09/08/2006  . Carpal tunnel syndrome 03/16/2009  . Chest pain, unspecified 08/20/2012  . Chest pain, unspecified 12/26/2011  . Concussion, unspecified 10/30/1994  . COPD (chronic obstructive pulmonary disease) ([place]) 02/24/2006  . Corns and callosities 04/11/2007  . Dermatophytosis of the body   . Disturbance of skin sensation   .  Diverticulosis of colon (without mention of hemorrhage) 06/14/2010  . Edema of male genital organs 08/20/2012  . Elevated blood pressure reading without diagnosis of hypertension 06/14/2010  . Encounter for long-term (current) use of other medications   . Hypercholesterolemia   . Hypertrophy of prostate without urinary obstruction and other lower urinary tract symptoms (LUTS)   . 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 Lumbago 05/11/2009  . Nocturia 08/20/2012  . Other and unspecified hyperlipidemia 02/24/2006  . Pain in joint, ankle and foot 04/11/2007  . Pterygium, unspecified 03/01/2006  . Rash and other nonspecific skin eruption 12/28/1999  . Seborrheic dermatitis, unspecified 03/21/2001  . 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 Tobacco use disorder   . Ulcer of other part of foot 03/16/2007  . Unspecified constipation 05/11/2009  . Urinary tract infection, site not specified 05/11/2009  . Ventral hernia, unspecified, without mention of obstruction or gangrene     Past Surgical History:  Procedure Laterality Date  . ELBOW SURGERY Right   . EYE SURGERY     Cataract Removal  . FRACTURE SURGERY  1986   right hip, right elbow  . KNEE SURGERY Right 01/2007   knee cap injury  . SKIN CANCER EXCISION  07/2014   Back and head   . TONSILLECTOMY  1948    Allergies  Allergen Reactions  . Zocor [Simvastatin] Shortness Of Breath    Sob and itching  . Ace Inhibitors  Cough    Tolerating ARB instead  . Lipitor [Atorvastatin]     Muscle and bone pain  . Pravachol [Pravastatin Sodium] Other (See Comments)    myalgias    Outpatient Encounter Medications as of 07/10/2017  Medication Sig  . aspirin 81 MG tablet Take 81 mg by mouth daily. Take one tablet once a day  . ibuprofen (ADVIL,MOTRIN) 200 MG tablet Take 400 mg by mouth every 4 (four) hours as needed for fever, headache, mild pain, moderate pain or cramping.  . Multiple  Vitamins-Minerals (ICAPS AREDS 2) CAPS Take 2 capsules by mouth daily.  [place] Kitchen omeprazole (PRILOSEC) 20 MG capsule Take 1 capsule (20 mg total) by mouth 2 (two) times daily before a meal.  . tamsulosin (FLOMAX) 0.4 MG CAPS capsule TAKE 1 CAPSULE (0.4 MG TOTAL) BY MOUTH DAILY.  . valsartan (DIOVAN) 80 MG tablet TAKE 1 TABLET (80 MG TOTAL) BY MOUTH DAILY.   No facility-administered encounter medications on file as of 07/10/2017.     Review of Systems:  Review of Systems  Constitutional: Negative for chills and fever.  HENT: Positive for hearing loss.        Bilateral hearing aids  Eyes:       Glasses-for reading   Respiratory: Negative.   Cardiovascular: Negative.   Gastrointestinal: Negative.        Bilateral inguinal hernia-reducible without problem   Genitourinary: Negative.   Musculoskeletal: Negative.   Neurological: Negative.   Psychiatric/Behavioral: Negative.     Health Maintenance  Topic Date Due  . TETANUS/TDAP  12/04/2023  . INFLUENZA VACCINE  Completed  . PNA vac Low Risk Adult  Completed    Physical Exam: Vitals:   07/10/17 0831  BP: 138/80  Pulse: 73  Temp: (!) 97.4 F (36.3 C)  TempSrc: Oral  SpO2: 98%  Weight: 144 lb (65.3 kg)  Height: 5\' 7"  (1.702 m)   Body mass index is 22.55 kg/m. Physical Exam  Constitutional: He is oriented to person, place, and time. He appears well-developed and well-nourished.  HENT:  Head: Normocephalic.  Cardiovascular: Normal rate, regular rhythm, normal heart sounds and intact distal pulses.  Pulmonary/Chest: Effort normal and breath sounds normal.  Neurological: He is alert and oriented to person, place, and time.  Skin: Skin is warm and dry.  Psychiatric: He has a normal mood and affect. His behavior is normal. Judgment and thought content normal.    Labs reviewed: Basic Metabolic Panel: Recent Labs    11/01/16 0807  NA 141  K 4.4  CL 107  CO2 26  GLUCOSE 94  BUN 11  CREATININE 1.20*  CALCIUM 8.7   Liver  Function Tests: Recent Labs    11/01/16 0807  AST 12  ALT 7*  ALKPHOS 55  BILITOT 0.4  PROT 6.1  ALBUMIN 3.6   No results for input(s): LIPASE, AMYLASE in the last 8760 hours. No results for input(s): AMMONIA in the last 8760 hours. CBC: No results for input(s): WBC, NEUTROABS, HGB, HCT, MCV, PLT in the last 8760 hours. Lipid Panel: Recent Labs    11/01/16 0807  CHOL 200*  HDL 46  LDLCALC 138*  TRIG 82  CHOLHDL 4.3   Lab Results  Component Value Date   HGBA1C 5.6 02/22/2016    Procedures since last visit: No results found.  Assessment/Plan  1. Essential hypertension, benign Blood pressure today in office elevated on recheck. He states he is taking his medication as directed. Asked to keep a BP log and notify  office if BP at home stays 140/90's.   - CBC with Differential/Platelet; Future - COMPLETE METABOLIC PANEL WITH GFR; Future  2. COPD GOLD II  He is followed by pulmonary. He denies any current symptoms with his COPD today in the office. He is not maintained on an inhaler at this time. He has follow up for yearly visit with pulmonary next week. Will look for note to review.   3. Hypercholesterolemia Labs from last year were elevated, he has significant allergies to statins. Will refrain from adding a medication today. Discussion of diet changes today and in previous visit. Will check labs prior to next visit to assess if diet has improved this.   - Lipid panel; Future  4. Benign esophageal stricture Was seen by GI in Nov 2018 and had upper endo which showed esophageal stricture. Will maintain prilosec daily. He is without abdominal pain of heart burn symptoms today.  5. Bilateral recurrent inguinal hernia without obstruction or gangrene Watch hernias for "strangulation signs" color changes and pain. Call office at the first sign of these or if the hernia will just not move back in.    Labs/tests ordered:   Orders Placed This Encounter  Procedures  . CBC  with Differential/Platelet    Standing Status:   Future    Standing Expiration Date:   03/09/2018  . COMPLETE METABOLIC PANEL WITH GFR    Standing Status:   Future    Standing Expiration Date:   03/09/2018  . Lipid panel    Standing Status:   Future    Standing Expiration Date:   03/09/2018    Next appt: 11/13/2017   Karen Kays, RN, DNP Student  Geriatrics [facility] [phone], [place] [address] Cell Phone (Mon-Fri 8am-5pm):  [phone] On Call:  [phone] & follow prompts after 5pm & weekends Office Phone:  [phone] Office Fax:  [phone]

## 2017-07-10 NOTE — Patient Instructions (Addendum)
Please check blood pressure at less one hour after medication, at different times of the day so that we can get a good reading of your blood pressure daily. If blood pressure stays over 140/80 please call the office.  Try and avoid high fat foods like meats, cheese, fried foods, and sweets.  Stay hydrated.   Please stop using Ibuprofen as this is harmful to your kidneys. Tylenol is a good choice for pain.  Watch hernias for "strangulation signs" color changes and pain. Call office at the first sign of these or if the hernia will just not move back in.

## 2017-07-17 ENCOUNTER — Ambulatory Visit (INDEPENDENT_AMBULATORY_CARE_PROVIDER_SITE_OTHER)
Admission: RE | Admit: 2017-07-17 | Discharge: 2017-07-17 | Disposition: A | Discharge status: Home / Self Care | Payer: Medicare HMO | Source: Ambulatory Visit | Attending: Internal Medicine | Admitting: Internal Medicine

## 2017-07-17 DIAGNOSIS — R918 Other nonspecific abnormal finding of lung field: Secondary | ICD-10-CM | POA: Diagnosis not present

## 2017-07-17 DIAGNOSIS — R911 Solitary pulmonary nodule: Secondary | ICD-10-CM | POA: Diagnosis not present

## 2017-07-18 NOTE — Progress Notes (Signed)
Spoke with pt and notified of results per Dr. Wert. Pt verbalized understanding and denied any questions. 

## 2017-09-04 ENCOUNTER — Ambulatory Visit (INDEPENDENT_AMBULATORY_CARE_PROVIDER_SITE_OTHER): Payer: Medicare HMO

## 2017-09-04 VITALS — BP 140/86 | HR 63 | Temp 97.4°F | Ht 67.0 in | Wt 144.0 lb

## 2017-09-04 DIAGNOSIS — Z0000 Encounter for general adult medical examination without abnormal findings: Secondary | ICD-10-CM

## 2017-09-04 DIAGNOSIS — Z8711 Personal history of peptic ulcer disease: Secondary | ICD-10-CM

## 2017-09-04 MED ORDER — OMEPRAZOLE 20 MG PO CPDR: 20.0000 mg | DELAYED_RELEASE_CAPSULE | Freq: Two times a day (BID) | ORAL | 0 refills | Status: DC

## 2017-09-04 NOTE — Patient Instructions (Signed)
Thomas Reyes , Thank you for taking time to come for your Medicare Wellness Visit. I appreciate your ongoing commitment to your health goals. Please review the following plan we discussed and let me know if I can assist you in the future.   Screening recommendations/referrals: Colonoscopy up to date, due 04/16/2023 Recommended yearly ophthalmology/optometry visit for glaucoma screening and checkup Recommended yearly dental visit for hygiene and checkup  Vaccinations: Influenza vaccine up to date, due 2019 fall season Pneumococcal vaccine up to date, completed Tdap vaccine up to date, due 12/04/2023 Shingles vaccine due, declined    Advanced directives: in chart  Conditions/risks identified: none  Next appointment: Dr. Mariea Clonts 11/13/2017 @ 1:30pm             [name], RN 09/07/2018 @ 9:15am  Preventive Care 78 Years and Older, Male Preventive care refers to lifestyle choices and visits with your health care provider that can promote health and wellness. What does preventive care include?  A yearly physical exam. This is also called an annual well check.  Dental exams once or twice a year.  Routine eye exams. Ask your health care provider how often you should have your eyes checked.  Personal lifestyle choices, including:  Daily care of your teeth and gums.  Regular physical activity.  Eating a healthy diet.  Avoiding tobacco and drug use.  Limiting alcohol use.  Practicing safe sex.  Taking low doses of aspirin every day.  Taking vitamin and mineral supplements as recommended by your health care provider. What happens during an annual well check? The services and screenings done by your health care provider during your annual well check will depend on your age, overall health, lifestyle risk factors, and family history of disease. Counseling  Your health care provider may ask you questions about your:  Alcohol use.  Tobacco use.  Drug use.  Emotional  well-being.  Home and relationship well-being.  Sexual activity.  Eating habits.  History of falls.  Memory and ability to understand (cognition).  Work and work Statistician. Screening  You may have the following tests or measurements:  Height, weight, and BMI.  Blood pressure.  Lipid and cholesterol levels. These may be checked every 5 years, or more frequently if you are over 35 years old.  Skin check.  Lung cancer screening. You may have this screening every year starting at age 27 if you have a 30-pack-year history of smoking and currently smoke or have quit within the past 15 years.  Fecal occult blood test (FOBT) of the stool. You may have this test every year starting at age 25.  Flexible sigmoidoscopy or colonoscopy. You may have a sigmoidoscopy every 5 years or a colonoscopy every 10 years starting at age 25.  Prostate cancer screening. Recommendations will vary depending on your family history and other risks.  Hepatitis C blood test.  Hepatitis B blood test.  Sexually transmitted disease (STD) testing.  Diabetes screening. This is done by checking your blood sugar (glucose) after you have not eaten for a while (fasting). You may have this done every 1-3 years.  Abdominal aortic aneurysm (AAA) screening. You may need this if you are a current or former smoker.  Osteoporosis. You may be screened starting at age 78 if you are at high risk. Talk with your health care provider about your test results, treatment options, and if necessary, the need for more tests. Vaccines  Your health care provider may recommend certain vaccines, such as:  Influenza vaccine.  This is recommended every year.  Tetanus, diphtheria, and acellular pertussis (Tdap, Td) vaccine. You may need a Td booster every 10 years.  Zoster vaccine. You may need this after age 78.  Pneumococcal 13-valent conjugate (PCV13) vaccine. One dose is recommended after age 78.  Pneumococcal  polysaccharide (PPSV23) vaccine. One dose is recommended after age 78. Talk to your health care provider about which screenings and vaccines you need and how often you need them. This information is not intended to replace advice given to you by your health care provider. Make sure you discuss any questions you have with your health care provider. Document Released: 06/19/2015 Document Revised: 02/10/2016 Document Reviewed: 03/24/2015 Elsevier Interactive Patient Education  2017 [place] Prevention in the Home Falls can cause injuries. They can happen to people of all ages. There are many things you can do to make your home safe and to help prevent falls. What can I do on the outside of my home?  Regularly fix the edges of walkways and driveways and fix any cracks.  Remove anything that might make you trip as you walk through a door, such as a raised step or threshold.  Trim any bushes or trees on the path to your home.  Use bright outdoor lighting.  Clear any walking paths of anything that might make someone trip, such as rocks or tools.  Regularly check to see if handrails are loose or broken. Make sure that both sides of any steps have handrails.  Any raised decks and porches should have guardrails on the edges.  Have any leaves, snow, or ice cleared regularly.  Use sand or salt on walking paths during winter.  Clean up any spills in your garage right away. This includes oil or grease spills. What can I do in the bathroom?  Use night lights.  Install grab bars by the toilet and in the tub and shower. Do not use towel bars as grab bars.  Use non-skid mats or decals in the tub or shower.  If you need to sit down in the shower, use a plastic, non-slip stool.  Keep the floor dry. Clean up any water that spills on the floor as soon as it happens.  Remove soap buildup in the tub or shower regularly.  Attach bath mats securely with double-sided non-slip rug  tape.  Do not have throw rugs and other things on the floor that can make you trip. What can I do in the bedroom?  Use night lights.  Make sure that you have a light by your bed that is easy to reach.  Do not use any sheets or blankets that are too big for your bed. They should not hang down onto the floor.  Have a firm chair that has side arms. You can use this for support while you get dressed.  Do not have throw rugs and other things on the floor that can make you trip. What can I do in the kitchen?  Clean up any spills right away.  Avoid walking on wet floors.  Keep items that you use a lot in easy-to-reach places.  If you need to reach something above you, use a strong step stool that has a grab bar.  Keep electrical cords out of the way.  Do not use floor polish or wax that makes floors slippery. If you must use wax, use non-skid floor wax.  Do not have throw rugs and other things on the floor that can make  you trip. What can I do with my stairs?  Do not leave any items on the stairs.  Make sure that there are handrails on both sides of the stairs and use them. Fix handrails that are broken or loose. Make sure that handrails are as long as the stairways.  Check any carpeting to make sure that it is firmly attached to the stairs. Fix any carpet that is loose or worn.  Avoid having throw rugs at the top or bottom of the stairs. If you do have throw rugs, attach them to the floor with carpet tape.  Make sure that you have a light switch at the top of the stairs and the bottom of the stairs. If you do not have them, ask someone to add them for you. What else can I do to help prevent falls?  Wear shoes that:  Do not have high heels.  Have rubber bottoms.  Are comfortable and fit you well.  Are closed at the toe. Do not wear sandals.  If you use a stepladder:  Make sure that it is fully opened. Do not climb a closed stepladder.  Make sure that both sides of the  stepladder are locked into place.  Ask someone to hold it for you, if possible.  Clearly mark and make sure that you can see:  Any grab bars or handrails.  First and last steps.  Where the edge of each step is.  Use tools that help you move around (mobility aids) if they are needed. These include:  Canes.  Walkers.  Scooters.  Crutches.  Turn on the lights when you go into a dark area. Replace any light bulbs as soon as they burn out.  Set up your furniture so you have a clear path. Avoid moving your furniture around.  If any of your floors are uneven, fix them.  If there are any pets around you, be aware of where they are.  Review your medicines with your doctor. Some medicines can make you feel dizzy. This can increase your chance of falling. Ask your doctor what other things that you can do to help prevent falls. This information is not intended to replace advice given to you by your health care provider. Make sure you discuss any questions you have with your health care provider. Document Released: 03/19/2009 Document Revised: 10/29/2015 Document Reviewed: 06/27/2014 Elsevier Interactive Patient Education  2017 [employer].

## 2017-09-04 NOTE — Progress Notes (Signed)
Subjective:   Thomas Reyes is a 77 y.o. male who presents for Medicare Annual/Subsequent preventive examination.  Last AWV-06/27/2016       Objective:    Vitals: BP 140/86 (BP Location: Right Arm, Patient Position: Sitting)   Pulse 63   Temp (!) 97.4 F (36.3 C) (Oral)   Ht 5\' 7"  (1.702 m)   Wt 144 lb (65.3 kg)   SpO2 96%   BMI 22.55 kg/m   Body mass index is 22.55 kg/m.  Advanced Directives 09/04/2017 07/10/2017 03/09/2017 11/03/2016 06/27/2016 02/25/2016 02/18/2016  Does Patient Have a Medical Advance Directive? Yes Yes Yes Yes Yes Yes Yes  Type of Advance Directive Living will Living will Living will Living will [place] will Living will  Does patient want to make changes to medical advance directive? No - Patient declined No - Patient declined - - - - -  Copy of Press photographer in Chart? - - - - Yes Yes -  Would patient like information on creating a medical advance directive? - - - - - - -    Tobacco Social History   Tobacco Use  Smoking Status Former Smoker  . Last attempt to quit: 06/09/2010  . Years since quitting: 7.2  Smokeless Tobacco Never Used     Counseling given: Not Answered   Clinical Intake:  Pre-visit preparation completed: No  Pain : No/denies pain     Nutritional Risks: None Diabetes: No  How often do you need to have someone help you when you read instructions, pamphlets, or other written materials from your doctor or pharmacy?: 1 - Never What is the last grade level you completed in school?: HS  Interpreter Needed?: No  Information entered by :: [name] ,RN  Past Medical History:  Diagnosis Date  . Allergic rhinitis, cause unspecified 09/08/2006  . Carpal tunnel syndrome 03/16/2009  . Chest pain, unspecified 08/20/2012  . Chest pain, unspecified 12/26/2011  . Concussion, unspecified 10/30/1994  . COPD (chronic obstructive pulmonary disease) ([place]) 02/24/2006  . Corns and callosities  04/11/2007  . Dermatophytosis of the body   . Disturbance of skin sensation   . Diverticulosis of colon (without mention of hemorrhage) 06/14/2010  . Edema of male genital organs 08/20/2012  . Elevated blood pressure reading without diagnosis of hypertension 06/14/2010  . Encounter for long-term (current) use of other medications   . Hypercholesterolemia   . Hypertrophy of prostate without urinary obstruction and other lower urinary tract symptoms (LUTS)   . 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 Lumbago 05/11/2009  . Nocturia 08/20/2012  . Other and unspecified hyperlipidemia 02/24/2006  . Pain in joint, ankle and foot 04/11/2007  . Pterygium, unspecified 03/01/2006  . Rash and other nonspecific skin eruption 12/28/1999  . Seborrheic dermatitis, unspecified 03/21/2001  . 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 Tobacco use disorder   . Ulcer of other part of foot 03/16/2007  . Unspecified constipation 05/11/2009  . Urinary tract infection, site not specified 05/11/2009  . Ventral hernia, unspecified, without mention of obstruction or gangrene    Past Surgical History:  Procedure Laterality Date  . ELBOW SURGERY Right   . EYE SURGERY     Cataract Removal  . FRACTURE SURGERY  1986   right hip, right elbow  . KNEE SURGERY Right 01/2007   knee cap injury  . SKIN CANCER EXCISION  07/2014   Back and head   .  TONSILLECTOMY  1948   Family History  Problem Relation Age of Onset  . Diabetes Mother   . Hypertension Mother   . Cancer Father    Social History   Socioeconomic History  . Marital status: Married    Spouse name: Not on file  . Number of children: Not on file  . Years of education: Not on file  . Highest education level: Not on file  Occupational History  . Not on file  Social Needs  . Financial resource strain: Not hard at all  . Food insecurity:    Worry: Never true    Inability: Never true  . Transportation  needs:    Medical: No    Non-medical: No  Tobacco Use  . Smoking status: Former Smoker    Last attempt to quit: 06/09/2010    Years since quitting: 7.2  . Smokeless tobacco: Never Used  Substance and Sexual Activity  . Alcohol use: No    Alcohol/week: 0.0 oz  . Drug use: No  . Sexual activity: Not Currently  Lifestyle  . Physical activity:    Days per week: 0 days    Minutes per session: 0 min  . Stress: Only a little  Relationships  . Social connections:    Talks on phone: Once a week    Gets together: Once a week    Attends religious service: More than 4 times per year    Active member of club or organization: No    Attends meetings of clubs or organizations: Never    Relationship status: Married  Other Topics Concern  . Not on file  Social History Narrative  . Not on file    Outpatient Encounter Medications as of 09/04/2017  Medication Sig  . aspirin 81 MG tablet Take 81 mg by mouth daily. Take one tablet once a day  . ibuprofen (ADVIL,MOTRIN) 200 MG tablet Take 400 mg by mouth every 4 (four) hours as needed for fever, headache, mild pain, moderate pain or cramping.  Marland Kitchen omeprazole (PRILOSEC) 20 MG capsule Take 1 capsule (20 mg total) by mouth 2 (two) times daily before a meal.  . tamsulosin (FLOMAX) 0.4 MG CAPS capsule TAKE 1 CAPSULE (0.4 MG TOTAL) BY MOUTH DAILY.  . valsartan (DIOVAN) 80 MG tablet TAKE 1 TABLET (80 MG TOTAL) BY MOUTH DAILY.  . Multiple Vitamins-Minerals (ICAPS AREDS 2) CAPS Take 2 capsules by mouth daily. (Patient not taking: Reported on 09/04/2017)   No facility-administered encounter medications on file as of 09/04/2017.     Activities of Daily Living In your present state of health, do you have any difficulty performing the following activities: 09/04/2017  Hearing? Y  Vision? Y  Difficulty concentrating or making decisions? Y  Walking or climbing stairs? N  Dressing or bathing? N  Doing errands, shopping? N  Preparing Food and eating ? N  Using the  Toilet? N  In the past six months, have you accidently leaked urine? N  Do you have problems with loss of bowel control? N  Managing your Medications? N  Managing your Finances? N  Housekeeping or managing your Housekeeping? N  Some recent data might be hidden    Patient Care Team: Gayland Curry, DO as PCP - General (Geriatric Medicine) Warden Fillers, MD as Consulting Physician (Ophthalmology)   Assessment:   This is a routine wellness examination for [place].  Exercise Activities and Dietary recommendations Current Exercise Habits: The patient does not participate in regular exercise at present, Exercise  limited by: None identified  Goals    None      Fall Risk Fall Risk  09/04/2017 07/10/2017 03/09/2017 06/30/2016 06/27/2016  Falls in the past year? No No No No No   Is the patient's home free of loose throw rugs in walkways, pet beds, electrical cords, etc?   yes      Grab bars in the bathroom? yes      Handrails on the stairs?   yes      Adequate lighting?   yes  Depression Screen PHQ 2/9 Scores 09/04/2017 07/10/2017 03/09/2017 06/30/2016  PHQ - 2 Score 0 0 0 0    Cognitive Function MMSE - Mini Mental State Exam 09/04/2017 06/27/2016 04/03/2015  Orientation to time 5 5 4   Orientation to Place 5 5 5   Registration 3 3 3   Attention/ Calculation 4 4 5   Recall 1 1 2   Language- name 2 objects 2 2 2   Language- repeat 1 1 1   Language- follow 3 step command 3 3 3   Language- read & follow direction 1 1 1   Write a sentence 1 1 1   Copy design 1 0 1  Total score 27 26 28         Immunization History  Administered Date(s) Administered  . Influenza, High Dose Seasonal PF 03/09/2017  . Influenza,inj,Quad PF,6+ Mos 03/31/2014, 04/03/2015, 02/25/2016  . Pneumococcal Conjugate-13 07/18/2013  . Pneumococcal Polysaccharide-23 10/02/2014  . Tdap 06/10/2005, 12/03/2013    Qualifies for Shingles Vaccine? Yes, educated and declined  Screening Tests Health Maintenance  Topic Date  Due  . INFLUENZA VACCINE  01/04/2018  . TETANUS/TDAP  12/04/2023  . PNA vac Low Risk Adult  Completed   Cancer Screenings: Lung: Low Dose CT Chest recommended if Age 34-80 years, 30 pack-year currently smoking OR have quit w/in 15years. Patient does qualify. Colorectal: up to date  Additional Screenings:  Hepatitis C Screening: declined    Plan:    I have personally reviewed and addressed the Medicare Annual Wellness questionnaire and have noted the following in the patient's chart:  A. Medical and social history B. Use of alcohol, tobacco or illicit drugs  C. Current medications and supplements D. Functional ability and status E.  Nutritional status F.  Physical activity G. Advance directives H. List of other physicians I.  Hospitalizations, surgeries, and ER visits in previous 12 months J.  [place] to include hearing, vision, cognitive, depression L. Referrals and appointments - none  In addition, I have reviewed and discussed with patient certain preventive protocols, quality metrics, and best practice recommendations. A written personalized care plan for preventive services as well as general preventive health recommendations were provided to patient.  See attached scanned questionnaire for additional information.   Signed,   [name], RN Nurse Health Advisor  Patient Concerns: R lower back spot itchy

## 2017-09-04 NOTE — Addendum Note (Signed)
Addended by: [name] E on: 09/04/2017 10:03 AM   Modules accepted: Orders

## 2017-11-09 ENCOUNTER — Other Ambulatory Visit: Payer: Medicare HMO

## 2017-11-09 DIAGNOSIS — E7800 Pure hypercholesterolemia, unspecified: Secondary | ICD-10-CM | POA: Diagnosis not present

## 2017-11-09 DIAGNOSIS — I10 Essential (primary) hypertension: Secondary | ICD-10-CM | POA: Diagnosis not present

## 2017-11-09 LAB — CBC WITH DIFFERENTIAL/PLATELET
Basophils Absolute: 22 cells/uL (ref 0–200)
Basophils Relative: 0.5 %
Eosinophils Absolute: 180 cells/uL (ref 15–500)
Eosinophils Relative: 4.1 %
HCT: 41.4 % (ref 38.5–50.0)
Hemoglobin: 14.1 g/dL (ref 13.2–17.1)
Lymphs Abs: 1223 cells/uL (ref 850–3900)
MCH: 29.2 pg (ref 27.0–33.0)
MCHC: 34.1 g/dL (ref 32.0–36.0)
MCV: 85.7 fL (ref 80.0–100.0)
MPV: 9.9 fL (ref 7.5–12.5)
Monocytes Relative: 9.2 %
Neutro Abs: 2570 cells/uL (ref 1500–7800)
Neutrophils Relative %: 58.4 %
Platelets: 198 10*3/uL (ref 140–400)
RBC: 4.83 10*6/uL (ref 4.20–5.80)
RDW: 13.3 % (ref 11.0–15.0)
Total Lymphocyte: 27.8 %
WBC mixed population: 405 cells/uL (ref 200–950)
WBC: 4.4 10*3/uL (ref 3.8–10.8)

## 2017-11-09 LAB — COMPLETE METABOLIC PANEL WITH GFR
AG Ratio: 1.6 (calc) (ref 1.0–2.5)
ALT: 9 U/L (ref 9–46)
AST: 16 U/L (ref 10–35)
Albumin: 4.1 g/dL (ref 3.6–5.1)
Alkaline phosphatase (APISO): 60 U/L (ref 40–115)
BUN/Creatinine Ratio: 11 (calc) (ref 6–22)
BUN: 15 mg/dL (ref 7–25)
CO2: 29 mmol/L (ref 20–32)
Calcium: 9.2 mg/dL (ref 8.6–10.3)
Chloride: 103 mmol/L (ref 98–110)
Creat: 1.41 mg/dL — ABNORMAL HIGH (ref 0.70–1.18)
GFR, Est African American: 55 mL/min/{1.73_m2} — ABNORMAL LOW (ref >=60)
GFR, Est Non African American: 48 mL/min/{1.73_m2} — ABNORMAL LOW (ref >=60)
Globulin: 2.6 g/dL (calc) (ref 1.9–3.7)
Glucose, Bld: 98 mg/dL (ref 65–99)
Potassium: 4.6 mmol/L (ref 3.5–5.3)
Sodium: 137 mmol/L (ref 135–146)
Total Bilirubin: 0.7 mg/dL (ref 0.2–1.2)
Total Protein: 6.7 g/dL (ref 6.1–8.1)

## 2017-11-09 LAB — LIPID PANEL
Cholesterol: 253 mg/dL — ABNORMAL HIGH (ref <200)
HDL: 44 mg/dL (ref >40)
LDL Cholesterol (Calc): 185 mg/dL (calc) — ABNORMAL HIGH
Non-HDL Cholesterol (Calc): 209 mg/dL (calc) — ABNORMAL HIGH (ref <130)
Total CHOL/HDL Ratio: 5.8 (calc) — ABNORMAL HIGH (ref <5.0)
Triglycerides: 112 mg/dL (ref <150)

## 2017-11-13 ENCOUNTER — Ambulatory Visit (INDEPENDENT_AMBULATORY_CARE_PROVIDER_SITE_OTHER): Payer: Medicare HMO | Admitting: Internal Medicine

## 2017-11-13 ENCOUNTER — Encounter: Payer: Self-pay | Admitting: Internal Medicine

## 2017-11-13 VITALS — BP 138/80 | HR 72 | Temp 97.5°F | Ht 67.0 in | Wt 140.0 lb

## 2017-11-13 DIAGNOSIS — T466X5A Adverse effect of antihyperlipidemic and antiarteriosclerotic drugs, initial encounter: Secondary | ICD-10-CM | POA: Diagnosis not present

## 2017-11-13 DIAGNOSIS — J449 Chronic obstructive pulmonary disease, unspecified: Secondary | ICD-10-CM | POA: Diagnosis not present

## 2017-11-13 DIAGNOSIS — M79672 Pain in left foot: Secondary | ICD-10-CM | POA: Diagnosis not present

## 2017-11-13 DIAGNOSIS — Z0000 Encounter for general adult medical examination without abnormal findings: Secondary | ICD-10-CM | POA: Diagnosis not present

## 2017-11-13 DIAGNOSIS — E7800 Pure hypercholesterolemia, unspecified: Secondary | ICD-10-CM | POA: Diagnosis not present

## 2017-11-13 DIAGNOSIS — M791 Myalgia, unspecified site: Secondary | ICD-10-CM

## 2017-11-13 DIAGNOSIS — K5901 Slow transit constipation: Secondary | ICD-10-CM

## 2017-11-13 DIAGNOSIS — I10 Essential (primary) hypertension: Secondary | ICD-10-CM | POA: Diagnosis not present

## 2017-11-13 NOTE — Progress Notes (Signed)
Provider:  Rexene Edison. Mariea Clonts, D.O., C.M.D. Location:   [facility]  Place of Service:   clinic  Previous PCP: Gayland Curry, DO Patient Care Team: Gayland Curry, DO as PCP - General (Geriatric Medicine) Warden Fillers, MD as Consulting Physician (Ophthalmology)  Extended Emergency Contact Information Primary Emergency Contact: [facility] Address: [place], [place] [address] [place] Phone: [phone] Relation: Spouse  Code Status: full code Goals of Care: Advanced Directive information Advanced Directives 11/13/2017  Does Patient Have a Medical Advance Directive? Yes  Type of Advance Directive Living will  Does patient want to make changes to medical advance directive? No - Patient declined  Copy of [place] in Chart? -  Would patient like information on creating a medical advance directive? -   Chief Complaint  Patient presents with  . Annual Exam    CPE    HPI: Patient is a 77 y.o. male seen today for an annual physical exam.  LDL is 185. He has had myalgias from lipitor, zocor, and pravachol.  Counseled on eating more chicken on the grill rather than steak and burgers.  He does have a lot of fried food.  He is baking fish instead of cooking it in grease.  Stays physically active.    Has hard skin on the back of his left foot that will soak in salt water and come off, but heel continues to hurt.  He requests something to put on there for the pain.    Bowels doing well now.  May have 2-3 days w/o a bm, but then may go three times.    Not c/o sob except if he walks up a bunch of hills.    Past Medical History:  Diagnosis Date  . Allergic rhinitis, cause unspecified 09/08/2006  . Carpal tunnel syndrome 03/16/2009  . Chest pain, unspecified 08/20/2012  . Chest pain, unspecified 12/26/2011  . Concussion, unspecified 10/30/1994  . COPD (chronic obstructive pulmonary disease) ([place]) 02/24/2006  .  Corns and callosities 04/11/2007  . Dermatophytosis of the body   . Disturbance of skin sensation   . Diverticulosis of colon (without mention of hemorrhage) 06/14/2010  . Edema of male genital organs 08/20/2012  . Elevated blood pressure reading without diagnosis of hypertension 06/14/2010  . Encounter for long-term (current) use of other medications   . Hypercholesterolemia   . Hypertrophy of prostate without urinary obstruction and other lower urinary tract symptoms (LUTS)   . 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 Lumbago 05/11/2009  . Nocturia 08/20/2012  . Other and unspecified hyperlipidemia 02/24/2006  . Pain in joint, ankle and foot 04/11/2007  . Pterygium, unspecified 03/01/2006  . Rash and other nonspecific skin eruption 12/28/1999  . Seborrheic dermatitis, unspecified 03/21/2001  . 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 Tobacco use disorder   . Ulcer of other part of foot 03/16/2007  . Unspecified constipation 05/11/2009  . Urinary tract infection, site not specified 05/11/2009  . Ventral hernia, unspecified, without mention of obstruction or gangrene    Past Surgical History:  Procedure Laterality Date  . ELBOW SURGERY Right   . EYE SURGERY     Cataract Removal  . FRACTURE SURGERY  1986   right hip, right elbow  . KNEE SURGERY Right 01/2007   knee cap injury  . SKIN CANCER EXCISION  07/2014   Back  and head   . TONSILLECTOMY  1948    reports that he quit smoking about 7 years ago. He has never used smokeless tobacco. He reports that he does not drink alcohol or use drugs.  Functional Status Survey:    Family History  Problem Relation Age of Onset  . Diabetes Mother   . Hypertension Mother   . Cancer Father     Health Maintenance  Topic Date Due  . INFLUENZA VACCINE  01/04/2018  . TETANUS/TDAP  12/04/2023  . PNA vac Low Risk Adult  Completed    Allergies  Allergen Reactions  . Zocor [Simvastatin]  Shortness Of Breath    Sob and itching  . Ace Inhibitors Cough    Tolerating ARB instead  . Lipitor [Atorvastatin]     Muscle and bone pain  . Pravachol [Pravastatin Sodium] Other (See Comments)    myalgias    Outpatient Encounter Medications as of 11/13/2017  Medication Sig  . aspirin 81 MG tablet Take 81 mg by mouth daily. Take one tablet once a day  . ibuprofen (ADVIL,MOTRIN) 200 MG tablet Take 400 mg by mouth every 4 (four) hours as needed for fever, headache, mild pain, moderate pain or cramping.  Marland Kitchen omeprazole (PRILOSEC) 20 MG capsule Take 1 capsule (20 mg total) by mouth 2 (two) times daily before a meal.  . tamsulosin (FLOMAX) 0.4 MG CAPS capsule TAKE 1 CAPSULE (0.4 MG TOTAL) BY MOUTH DAILY.  . valsartan (DIOVAN) 80 MG tablet TAKE 1 TABLET (80 MG TOTAL) BY MOUTH DAILY.   No facility-administered encounter medications on file as of 11/13/2017.     Review of Systems  Constitutional: Negative for chills, diaphoresis, fever, malaise/fatigue and weight loss.  HENT: Positive for hearing loss.        Bilateral hearing aids  Eyes: Negative for blurred vision.  Respiratory: Negative for cough and shortness of breath.   Cardiovascular: Negative for chest pain, palpitations and leg swelling.  Gastrointestinal: Positive for constipation. Negative for abdominal pain, blood in stool, diarrhea and melena.  Genitourinary: Negative for dysuria.  Musculoskeletal: Negative for falls and joint pain.       Heel pain left  Skin:       Fungal toenails  Neurological: Negative for dizziness and loss of consciousness.  Endo/Heme/Allergies: Does not bruise/bleed easily.  Psychiatric/Behavioral: Negative for depression and memory loss. The patient is not nervous/anxious and does not have insomnia.     Vitals:   11/13/17 1335  BP: 138/80  Pulse: 72  Temp: (!) 97.5 F (36.4 C)  TempSrc: Oral  SpO2: 97%  Weight: 140 lb (63.5 kg)  Height: 5\' 7"  (1.702 m)   Body mass index is 21.93  kg/m. Physical Exam  Constitutional: He is oriented to person, place, and time. He appears well-developed and well-nourished. No distress.  HENT:  Head: Normocephalic and atraumatic.  Right Ear: External ear normal.  Left Ear: External ear normal.  Nose: Nose normal.  Mouth/Throat: Oropharynx is clear and moist. No oropharyngeal exudate.  Bilateral hearing aids, only small amt cerumen in each ear canal  Eyes: Pupils are equal, round, and reactive to light. Conjunctivae and EOM are normal.  Neck: Normal range of motion. Neck supple. No JVD present. No tracheal deviation present. No thyromegaly present.  Cardiovascular: Normal rate, regular rhythm, normal heart sounds and intact distal pulses.  Pulmonary/Chest: Effort normal and breath sounds normal. He has no wheezes.  Abdominal: Soft. Bowel sounds are normal. He exhibits no distension. There is no  tenderness.  Musculoskeletal: Normal range of motion.  Lymphadenopathy:    He has no cervical adenopathy.  Neurological: He is alert and oriented to person, place, and time. He displays normal reflexes. No cranial nerve deficit. Coordination normal.  Skin: Skin is warm and dry. Capillary refill takes less than 2 seconds.  Scaly dry skin on left heel; tender over lateral heel bone  Psychiatric: He has a normal mood and affect.    Labs reviewed: Basic Metabolic Panel: Recent Labs    11/09/17 0809  NA 137  K 4.6  CL 103  CO2 29  GLUCOSE 98  BUN 15  CREATININE 1.41*  CALCIUM 9.2   Liver Function Tests: Recent Labs    11/09/17 0809  AST 16  ALT 9  BILITOT 0.7  PROT 6.7   No results for input(s): LIPASE, AMYLASE in the last 8760 hours. No results for input(s): AMMONIA in the last 8760 hours. CBC: Recent Labs    11/09/17 0809  WBC 4.4  NEUTROABS 2,570  HGB 14.1  HCT 41.4  MCV 85.7  PLT 198   Cardiac Enzymes: No results for input(s): CKTOTAL, CKMB, CKMBINDEX, TROPONINI in the last 8760 hours. BNP: Invalid input(s):  POCBNP Lab Results  Component Value Date   HGBA1C 5.6 02/22/2016   No results found for: TSH No results found for: VITAMINB12 No results found for: FOLATE No results found for: IRON, TIBC, FERRITIN  Imaging and Procedures Recently: EKG performed and reviewed in encounter.  Assessment/Plan 1. Annual physical exam -performed today, refuses statins, has not gotten shingrix, otherwise up to date  2. Essential hypertension, benign - bp at goal for him, does not want more meds - EKG 12-Lead  3. Hypercholesterolemia -remains sky high, counseled on dietary changes and given low fat low cholesterol information again today, cont to be physically active -refuses statins due to prior myalgias with three of them  4. Constipation, slow transit -ongoing, but responsive to fiber, otcs  5. COPD GOLD II  -stable, sob with significant exertion only by his report, refused to go back to pulmonary--claims that nothing was found but got copd diagnosis, inhalers expensive  6. Myalgia due to statin -with three different statins (see allergies)  7. Pain of left heel -advised to use aspercreme otc on the heel for pain; if persists and injection needed, would refer to podiatry  Labs/tests ordered:  No new F/u 6 mos  Casimer Russett L. Tristin Gladman, D.O. [facility] [address], [place] [address] Cell Phone (Mon-Fri 8am-5pm):  [phone] On Call:  [phone] & follow prompts after 5pm & weekends Office Phone:  [phone] Office Fax:  [phone]

## 2017-11-13 NOTE — Patient Instructions (Addendum)
Try using aspercreme on your left heel to help with pain.  It's ok to continue epsom salt soaks and scrubbing the dry skin.  Fat and Cholesterol Restricted Diet Getting too much fat and cholesterol in your diet may cause health problems. Following this diet helps keep your fat and cholesterol at normal levels. This can keep you from getting sick. What types of fat should I choose?  Choose monosaturated and polyunsaturated fats. These are found in foods such as olive oil, canola oil, flaxseeds, walnuts, almonds, and seeds.  Eat more omega-3 fats. Good choices include salmon, mackerel, sardines, tuna, flaxseed oil, and ground flaxseeds.  Limit saturated fats. These are in animal products such as meats, butter, and cream. They can also be in plant products such as palm oil, palm kernel oil, and coconut oil.  Avoid foods with partially hydrogenated oils in them. These contain trans fats. Examples of foods that have trans fats are stick margarine, some tub margarines, cookies, crackers, and other baked goods. What general guidelines do I need to follow?  Check food labels. Look for the words "trans fat" and "saturated fat."  When preparing a meal: ? Fill half of your plate with vegetables and green salads. ? Fill one fourth of your plate with whole grains. Look for the word "whole" as the first word in the ingredient list. ? Fill one fourth of your plate with lean protein foods.  Eat more foods that have fiber, like apples, carrots, beans, peas, and barley.  Eat more home-cooked foods. Eat less at restaurants and buffets.  Limit or avoid alcohol.  Limit foods high in starch and sugar.  Limit fried foods.  Cook foods without frying them. Baking, boiling, grilling, and broiling are all great options.  Lose weight if you are overweight. Losing even a small amount of weight can help your overall health. It can also help prevent diseases such as diabetes and heart disease. What foods can I  eat? Grains Whole grains, such as whole wheat or whole grain breads, crackers, cereals, and pasta. Unsweetened oatmeal, bulgur, barley, quinoa, or brown rice. Corn or whole wheat flour tortillas. Vegetables Fresh or frozen vegetables (raw, steamed, roasted, or grilled). Green salads. Fruits All fresh, canned (in natural juice), or frozen fruits. Meat and Other Protein Products Ground beef (85% or leaner), grass-fed beef, or beef trimmed of fat. Skinless chicken or [place]. Ground chicken or [place]. Pork trimmed of fat. All fish and seafood. Eggs. Dried beans, peas, or lentils. Unsalted nuts or seeds. Unsalted canned or dry beans. Dairy Low-fat dairy products, such as skim or 1% milk, 2% or reduced-fat cheeses, low-fat ricotta or cottage cheese, or plain low-fat yogurt. Fats and Oils Tub margarines without trans fats. Light or reduced-fat mayonnaise and salad dressings. Avocado. Olive, canola, sesame, or safflower oils. Natural peanut or almond butter (choose ones without added sugar and oil). The items listed above may not be a complete list of recommended foods or beverages. Contact your dietitian for more options. What foods are not recommended? Grains White bread. White pasta. White rice. Cornbread. Bagels, pastries, and croissants. Crackers that contain trans fat. Vegetables White potatoes. Corn. Creamed or fried vegetables. Vegetables in a cheese sauce. Fruits Dried fruits. Canned fruit in light or heavy syrup. Fruit juice. Meat and Other Protein Products Fatty cuts of meat. Ribs, chicken wings, bacon, sausage, bologna, salami, chitterlings, fatback, hot dogs, bratwurst, and packaged luncheon meats. Liver and organ meats. Dairy Whole or 2% milk, cream, half-and-half, and cream cheese.  Whole milk cheeses. Whole-fat or sweetened yogurt. Full-fat cheeses. Nondairy creamers and whipped toppings. Processed cheese, cheese spreads, or cheese curds. Sweets and Desserts Corn syrup, sugars,  honey, and molasses. Candy. Jam and jelly. Syrup. Sweetened cereals. Cookies, pies, cakes, donuts, muffins, and ice cream. Fats and Oils Butter, stick margarine, lard, shortening, ghee, or bacon fat. Coconut, palm kernel, or palm oils. Beverages Alcohol. Sweetened drinks (such as sodas, lemonade, and fruit drinks or punches). The items listed above may not be a complete list of foods and beverages to avoid. Contact your dietitian for more information. This information is not intended to replace advice given to you by your health care provider. Make sure you discuss any questions you have with your health care provider. Document Released: 11/22/2011 Document Revised: 01/28/2016 Document Reviewed: 08/22/2013 Elsevier Interactive Patient Education  [employer].

## 2017-11-14 DIAGNOSIS — H353131 Nonexudative age-related macular degeneration, bilateral, early dry stage: Secondary | ICD-10-CM | POA: Diagnosis not present

## 2017-11-14 DIAGNOSIS — H04123 Dry eye syndrome of bilateral lacrimal glands: Secondary | ICD-10-CM | POA: Diagnosis not present

## 2017-11-14 DIAGNOSIS — Z961 Presence of intraocular lens: Secondary | ICD-10-CM | POA: Diagnosis not present

## 2017-11-14 DIAGNOSIS — H10413 Chronic giant papillary conjunctivitis, bilateral: Secondary | ICD-10-CM | POA: Diagnosis not present

## 2017-11-14 DIAGNOSIS — H11002 Unspecified pterygium of left eye: Secondary | ICD-10-CM | POA: Diagnosis not present

## 2018-03-28 ENCOUNTER — Other Ambulatory Visit: Payer: Self-pay | Admitting: Internal Medicine

## 2018-03-28 DIAGNOSIS — Z8711 Personal history of peptic ulcer disease: Secondary | ICD-10-CM

## 2018-05-21 ENCOUNTER — Ambulatory Visit (INDEPENDENT_AMBULATORY_CARE_PROVIDER_SITE_OTHER): Payer: Medicare HMO | Admitting: Internal Medicine

## 2018-05-21 ENCOUNTER — Encounter: Payer: Self-pay | Admitting: Internal Medicine

## 2018-05-21 VITALS — BP 132/80 | HR 66 | Temp 97.6°F | Ht 67.0 in | Wt 139.0 lb

## 2018-05-21 DIAGNOSIS — G720 Drug-induced myopathy: Secondary | ICD-10-CM

## 2018-05-21 DIAGNOSIS — T466X5A Adverse effect of antihyperlipidemic and antiarteriosclerotic drugs, initial encounter: Secondary | ICD-10-CM

## 2018-05-21 DIAGNOSIS — L853 Xerosis cutis: Secondary | ICD-10-CM | POA: Diagnosis not present

## 2018-05-21 DIAGNOSIS — K439 Ventral hernia without obstruction or gangrene: Secondary | ICD-10-CM

## 2018-05-21 DIAGNOSIS — E7800 Pure hypercholesterolemia, unspecified: Secondary | ICD-10-CM | POA: Diagnosis not present

## 2018-05-21 DIAGNOSIS — I10 Essential (primary) hypertension: Secondary | ICD-10-CM

## 2018-05-21 DIAGNOSIS — Z23 Encounter for immunization: Secondary | ICD-10-CM | POA: Diagnosis not present

## 2018-05-21 DIAGNOSIS — R739 Hyperglycemia, unspecified: Secondary | ICD-10-CM

## 2018-05-21 DIAGNOSIS — L570 Actinic keratosis: Secondary | ICD-10-CM

## 2018-05-21 NOTE — Patient Instructions (Addendum)
Aquaphor cream Shea butter  Cocoa butter Are good moisturizers to apply after bathing to your feet, hands and any dry skin.  Please try to replace some of your pepsis with water.  At least one per day.

## 2018-05-21 NOTE — Progress Notes (Signed)
Location:  [facility] clinic Provider:  Kyden Potash L. Mariea Clonts, D.O., C.M.D.  Code Status: full code Goals of Care:  Advanced Directives 11/13/2017  Does Patient Have a Medical Advance Directive? Yes  Type of Advance Directive Living will  Does patient want to make changes to medical advance directive? No - Patient declined  Copy of [place] in Chart? -  Would patient like information on creating a medical advance directive? -     Chief Complaint  Patient presents with  . Medical Management of Chronic Issues    99mth follow-up    HPI: Patient is a 77 y.o. male seen today for medical management of chronic diseases.    Has pain where his hernia is overnight, but then when he gets up and has coffee, it goes away.  Other times, it doesn't bother him at all.  Comes out depending on his position, but it always goes back in--about takes his breath sometimes.  He's able to reduce it by rubbing it.    Feet are getting very dry--skin gets hard under his toenails and he has to soak them to get it back out.    Does not drink water.  Mostly pepsi and coffee.  Only drinks 1.5 cups of coffee.  Counseled on hydration and negatives of soda consumption.  Past Medical History:  Diagnosis Date  . Allergic rhinitis, cause unspecified 09/08/2006  . Carpal tunnel syndrome 03/16/2009  . Chest pain, unspecified 08/20/2012  . Chest pain, unspecified 12/26/2011  . Concussion, unspecified 10/30/1994  . COPD (chronic obstructive pulmonary disease) ([place]) 02/24/2006  . Corns and callosities 04/11/2007  . Dermatophytosis of the body   . Disturbance of skin sensation   . Diverticulosis of colon (without mention of hemorrhage) 06/14/2010  . Edema of male genital organs 08/20/2012  . Elevated blood pressure reading without diagnosis of hypertension 06/14/2010  . Encounter for long-term (current) use of other medications   . Hypercholesterolemia   . Hypertrophy of prostate without urinary obstruction  and other lower urinary tract symptoms (LUTS)   . 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 Lumbago 05/11/2009  . Nocturia 08/20/2012  . Other and unspecified hyperlipidemia 02/24/2006  . Pain in joint, ankle and foot 04/11/2007  . Pterygium, unspecified 03/01/2006  . Rash and other nonspecific skin eruption 12/28/1999  . Seborrheic dermatitis, unspecified 03/21/2001  . 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 Tobacco use disorder   . Ulcer of other part of foot 03/16/2007  . Unspecified constipation 05/11/2009  . Urinary tract infection, site not specified 05/11/2009  . Ventral hernia, unspecified, without mention of obstruction or gangrene     Past Surgical History:  Procedure Laterality Date  . ELBOW SURGERY Right   . EYE SURGERY     Cataract Removal  . FRACTURE SURGERY  1986   right hip, right elbow  . KNEE SURGERY Right 01/2007   knee cap injury  . SKIN CANCER EXCISION  07/2014   Back and head   . TONSILLECTOMY  1948    Allergies  Allergen Reactions  . Zocor [Simvastatin] Shortness Of Breath    Sob and itching  . Ace Inhibitors Cough    Tolerating ARB instead  . Lipitor [Atorvastatin]     Muscle and bone pain  . Pravachol [Pravastatin Sodium] Other (See Comments)    myalgias    Outpatient Encounter Medications as of 05/21/2018  Medication Sig  . aspirin 81 MG tablet  Take 81 mg by mouth daily. Take one tablet once a day  . ibuprofen (ADVIL,MOTRIN) 200 MG tablet Take 400 mg by mouth every 4 (four) hours as needed for fever, headache, mild pain, moderate pain or cramping.  Marland Kitchen omeprazole (PRILOSEC) 20 MG capsule TAKE 1 CAPSULE (20 MG TOTAL) BY MOUTH TWO TIMES DAILY BEFORE A MEAL.  . tamsulosin (FLOMAX) 0.4 MG CAPS capsule TAKE 1 CAPSULE (0.4 MG TOTAL) BY MOUTH DAILY.  . valsartan (DIOVAN) 80 MG tablet TAKE 1 TABLET (80 MG TOTAL) BY MOUTH DAILY.   No facility-administered encounter medications on file as of 05/21/2018.      Review of Systems:  Review of Systems  Constitutional: Negative for chills, fever and malaise/fatigue.  HENT: Positive for hearing loss.        Hearing aids  Eyes: Negative for blurred vision.  Respiratory: Negative for cough and shortness of breath.   Cardiovascular: Negative for chest pain and palpitations.  Gastrointestinal: Positive for constipation. Negative for abdominal pain, blood in stool, diarrhea, heartburn, melena, nausea and vomiting.       Hernia  Genitourinary: Negative for dysuria.  Musculoskeletal: Negative for back pain, falls, joint pain and myalgias.  Skin:       Very dry scaly skin of feet  Neurological: Negative for dizziness, weakness and headaches.  Endo/Heme/Allergies: Does not bruise/bleed easily.  Psychiatric/Behavioral: Negative for depression and memory loss. The patient is not nervous/anxious and does not have insomnia.     Health Maintenance  Topic Date Due  . INFLUENZA VACCINE  01/04/2018  . TETANUS/TDAP  12/04/2023  . PNA vac Low Risk Adult  Completed    Physical Exam: Vitals:   05/21/18 0959  BP: 132/80  Pulse: 66  Temp: 97.6 F (36.4 C)  TempSrc: Oral  SpO2: 96%  Weight: 139 lb (63 kg)  Height: 5\' 7"  (1.702 m)   Body mass index is 21.77 kg/m. Physical Exam Constitutional:      Appearance: Normal appearance.  Cardiovascular:     Rate and Rhythm: Normal rate and regular rhythm.     Pulses: Normal pulses.     Heart sounds: Normal heart sounds.  Pulmonary:     Effort: Pulmonary effort is normal.     Breath sounds: Normal breath sounds.  Abdominal:     General: Bowel sounds are normal.     Palpations: Abdomen is soft.     Hernia: A hernia is present.  Skin:    General: Skin is warm and dry.     Capillary Refill: Capillary refill takes less than 2 seconds.  Neurological:     General: No focal deficit present.     Mental Status: He is alert and oriented to person, place, and time.  Psychiatric:        Mood and Affect: Mood  normal.     Labs reviewed: Basic Metabolic Panel: Recent Labs    11/09/17 0809  NA 137  K 4.6  CL 103  CO2 29  GLUCOSE 98  BUN 15  CREATININE 1.41*  CALCIUM 9.2   Liver Function Tests: Recent Labs    11/09/17 0809  AST 16  ALT 9  BILITOT 0.7  PROT 6.7   No results for input(s): LIPASE, AMYLASE in the last 8760 hours. No results for input(s): AMMONIA in the last 8760 hours. CBC: Recent Labs    11/09/17 0809  WBC 4.4  NEUTROABS 2,570  HGB 14.1  HCT 41.4  MCV 85.7  PLT 198   Lipid Panel:  Recent Labs    11/09/17 0809  CHOL 253*  HDL 44  LDLCALC 185*  TRIG 112  CHOLHDL 5.8*   Lab Results  Component Value Date   HGBA1C 5.6 02/22/2016    Procedures since last visit: No results found.  Assessment/Plan 1. Xerosis cutis - recommended three otc creams to use after bathing for his feet and hands especially. - Ambulatory referral to Dermatology  2. Hernia of abdominal wall -ongoing, but remains reducible for him, cont to monitor  3. Need for influenza vaccination - Flu vaccine HIGH DOSE PF (Fluzone High dose)  4. Essential hypertension, benign - bp at goal, cont same regimen, avoid high sodium foods - CBC with Differential/Platelet; Future - COMPLETE METABOLIC PANEL WITH GFR; Future  5. Hypercholesterolemia - trying to eat more chicken and salads, counseled again on this - Lipid panel; Future  6. Actinic keratoses - historical--referred back to Dr. Ledell Peoples office--has not had his regular skin exam - Ambulatory referral to Dermatology  7. 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 Flu vaccine HIGH DOSE PF (Fluzone High dose)  . CBC with Differential/Platelet    Standing Status:   Future    Standing Expiration Date:   05/22/2019  .  COMPLETE METABOLIC PANEL WITH GFR    Standing Status:   Future    Standing Expiration Date:   05/22/2019  . Lipid panel    Standing Status:   Future    Standing Expiration Date:   05/22/2019    Order Specific Question:   Has the patient fasted?    Answer:   Yes  . Hemoglobin A1c    Standing Status:   Future    Standing Expiration Date:   05/22/2019  . Ambulatory referral to Dermatology    Referral Priority:   Routine    Referral Type:   Consultation    Referral Reason:   Specialty Services Required    Requested Specialty:   Dermatology    Number of Visits Requested:   1   Next appt:  09/07/2018   Darreon Lutes L. Dmauri Rosenow, D.O. [facility] [address], [place] [address] Cell Phone (Mon-Fri 8am-5pm):  [phone] On Call:  [phone] & follow prompts after 5pm & weekends Office Phone:  [phone] Office Fax:  [phone]

## 2018-08-14 ENCOUNTER — Other Ambulatory Visit: Payer: Self-pay | Admitting: Internal Medicine

## 2018-09-07 ENCOUNTER — Ambulatory Visit: Payer: Self-pay

## 2018-09-07 ENCOUNTER — Encounter: Payer: Medicare HMO | Admitting: Family

## 2018-09-30 ENCOUNTER — Encounter: Payer: Self-pay | Admitting: Internal Medicine

## 2018-10-05 ENCOUNTER — Ambulatory Visit (INDEPENDENT_AMBULATORY_CARE_PROVIDER_SITE_OTHER): Payer: Medicare HMO | Admitting: Family

## 2018-10-05 ENCOUNTER — Encounter: Payer: Self-pay | Admitting: Family

## 2018-10-05 ENCOUNTER — Other Ambulatory Visit: Payer: Self-pay

## 2018-10-05 VITALS — Ht 67.0 in | Wt 142.0 lb

## 2018-10-05 DIAGNOSIS — Z0000 Encounter for general adult medical examination without abnormal findings: Secondary | ICD-10-CM | POA: Diagnosis not present

## 2018-10-05 NOTE — Patient Instructions (Signed)
Mr. Thomas Reyes , Thank you for taking time to come for your Medicare Wellness Visit. I appreciate your ongoing commitment to your health goals. Please review the following plan we discussed and let me know if I can assist you in the future.   Screening recommendations/referrals: Colonoscopy: Up to date  Recommended yearly ophthalmology/optometry visit for glaucoma screening and checkup Recommended yearly dental visit for hygiene and checkup  Vaccinations: Influenza vaccine: Up to date  Pneumococcal vaccine: Up to date  Tdap vaccine: up to date due 12/04/2023  Shingles vaccine: Declined due to cost   Advanced directives: yes   Conditions/risks identified: Hypertension,male Gender,Advance age > 6 men,Hx of smoking   Next appointment: 1 year   Preventive Care 6 Years and Older, Male Preventive care refers to lifestyle choices and visits with your health care provider that can promote health and wellness. What does preventive care include?  A yearly physical exam. This is also called an annual well check.  Dental exams once or twice a year.  Routine eye exams. Ask your health care provider how often you should have your eyes checked.  Personal lifestyle choices, including:  Daily care of your teeth and gums.  Regular physical activity.  Eating a healthy diet.  Avoiding tobacco and drug use.  Limiting alcohol use.  Practicing safe sex.  Taking low doses of aspirin every day.  Taking vitamin and mineral supplements as recommended by your health care provider. What happens during an annual well check? The services and screenings done by your health care provider during your annual well check will depend on your age, overall health, lifestyle risk factors, and family history of disease. Counseling  Your health care provider may ask you questions about your:  Alcohol use.  Tobacco use.  Drug use.  Emotional well-being.  Home and relationship well-being.  Sexual  activity.  Eating habits.  History of falls.  Memory and ability to understand (cognition).  Work and work Statistician. Screening  You may have the following tests or measurements:  Height, weight, and BMI.  Blood pressure.  Lipid and cholesterol levels. These may be checked every 5 years, or more frequently if you are over 9 years old.  Skin check.  Lung cancer screening. You may have this screening every year starting at age 61 if you have a 30-pack-year history of smoking and currently smoke or have quit within the past 15 years.  Fecal occult blood test (FOBT) of the stool. You may have this test every year starting at age 65.  Flexible sigmoidoscopy or colonoscopy. You may have a sigmoidoscopy every 5 years or a colonoscopy every 10 years starting at age 55.  Prostate cancer screening. Recommendations will vary depending on your family history and other risks.  Hepatitis C blood test.  Hepatitis B blood test.  Sexually transmitted disease (STD) testing.  Diabetes screening. This is done by checking your blood sugar (glucose) after you have not eaten for a while (fasting). You may have this done every 1-3 years.  Abdominal aortic aneurysm (AAA) screening. You may need this if you are a current or former smoker.  Osteoporosis. You may be screened starting at age 47 if you are at high risk. Talk with your health care provider about your test results, treatment options, and if necessary, the need for more tests. Vaccines  Your health care provider may recommend certain vaccines, such as:  Influenza vaccine. This is recommended every year.  Tetanus, diphtheria, and acellular pertussis (Tdap, Td) vaccine.  You may need a Td booster every 10 years.  Zoster vaccine. You may need this after age 42.  Pneumococcal 13-valent conjugate (PCV13) vaccine. One dose is recommended after age 9.  Pneumococcal polysaccharide (PPSV23) vaccine. One dose is recommended after age 37.  Talk to your health care provider about which screenings and vaccines you need and how often you need them. This information is not intended to replace advice given to you by your health care provider. Make sure you discuss any questions you have with your health care provider. Document Released: 06/19/2015 Document Revised: 02/10/2016 Document Reviewed: 03/24/2015 Elsevier Interactive Patient Education  2017 [employer] Prevention in the Home Falls can cause injuries. They can happen to people of all ages. There are many things you can do to make your home safe and to help prevent falls. What can I do on the outside of my home?  Regularly fix the edges of walkways and driveways and fix any cracks.  Remove anything that might make you trip as you walk through a door, such as a raised step or threshold.  Trim any bushes or trees on the path to your home.  Use bright outdoor lighting.  Clear any walking paths of anything that might make someone trip, such as rocks or tools.  Regularly check to see if handrails are loose or broken. Make sure that both sides of any steps have handrails.  Any raised decks and porches should have guardrails on the edges.  Have any leaves, snow, or ice cleared regularly.  Use sand or salt on walking paths during winter.  Clean up any spills in your garage right away. This includes oil or grease spills. What can I do in the bathroom?  Use night lights.  Install grab bars by the toilet and in the tub and shower. Do not use towel bars as grab bars.  Use non-skid mats or decals in the tub or shower.  If you need to sit down in the shower, use a plastic, non-slip stool.  Keep the floor dry. Clean up any water that spills on the floor as soon as it happens.  Remove soap buildup in the tub or shower regularly.  Attach bath mats securely with double-sided non-slip rug tape.  Do not have throw rugs and other things on the floor that can make you  trip. What can I do in the bedroom?  Use night lights.  Make sure that you have a light by your bed that is easy to reach.  Do not use any sheets or blankets that are too big for your bed. They should not hang down onto the floor.  Have a firm chair that has side arms. You can use this for support while you get dressed.  Do not have throw rugs and other things on the floor that can make you trip. What can I do in the kitchen?  Clean up any spills right away.  Avoid walking on wet floors.  Keep items that you use a lot in easy-to-reach places.  If you need to reach something above you, use a strong step stool that has a grab bar.  Keep electrical cords out of the way.  Do not use floor polish or wax that makes floors slippery. If you must use wax, use non-skid floor wax.  Do not have throw rugs and other things on the floor that can make you trip. What can I do with my stairs?  Do not leave any  items on the stairs.  Make sure that there are handrails on both sides of the stairs and use them. Fix handrails that are broken or loose. Make sure that handrails are as long as the stairways.  Check any carpeting to make sure that it is firmly attached to the stairs. Fix any carpet that is loose or worn.  Avoid having throw rugs at the top or bottom of the stairs. If you do have throw rugs, attach them to the floor with carpet tape.  Make sure that you have a light switch at the top of the stairs and the bottom of the stairs. If you do not have them, ask someone to add them for you. What else can I do to help prevent falls?  Wear shoes that:  Do not have high heels.  Have rubber bottoms.  Are comfortable and fit you well.  Are closed at the toe. Do not wear sandals.  If you use a stepladder:  Make sure that it is fully opened. Do not climb a closed stepladder.  Make sure that both sides of the stepladder are locked into place.  Ask someone to hold it for you, if  possible.  Clearly mark and make sure that you can see:  Any grab bars or handrails.  First and last steps.  Where the edge of each step is.  Use tools that help you move around (mobility aids) if they are needed. These include:  Canes.  Walkers.  Scooters.  Crutches.  Turn on the lights when you go into a dark area. Replace any light bulbs as soon as they burn out.  Set up your furniture so you have a clear path. Avoid moving your furniture around.  If any of your floors are uneven, fix them.  If there are any pets around you, be aware of where they are.  Review your medicines with your doctor. Some medicines can make you feel dizzy. This can increase your chance of falling. Ask your doctor what other things that you can do to help prevent falls. This information is not intended to replace advice given to you by your health care provider. Make sure you discuss any questions you have with your health care provider. Document Released: 03/19/2009 Document Revised: 10/29/2015 Document Reviewed: 06/27/2014 Elsevier Interactive Patient Education  2017 [employer].

## 2018-10-05 NOTE — Progress Notes (Signed)
Patient ID: Thomas Reyes, male   DOB: 01-07-1941, 78 y.o.   MRN: [id number] This service is provided via telemedicine  No vital signs collected/recorded due to the encounter was a telemedicine visit.   Location of patient (ex: home, work):  HOME  Patient consents to a telephone visit:  YES  Location of the provider (ex: office, home):  OFFICE  Name of any referring provider:  DR [facility] REED, DO  Names of all persons participating in the telemedicine service and their role in the encounter:  PATIENT, Sye Schroepfer CMA, [facility], NP  Time spent on call:  6:17

## 2018-10-05 NOTE — Progress Notes (Signed)
Subjective:   Thomas Reyes is a 78 y.o. male who presents for Medicare Annual/Subsequent preventive examination.  Review of Systems:   Cardiac Risk Factors include: hypertension;male gender;advanced age (>39men, >32 women);smoking/ tobacco exposure     Objective:    Vitals: Ht 5\' 7"  (1.702 m)   Wt 142 lb (64.4 kg)   BMI 22.24 kg/m   Body mass index is 22.24 kg/m.  Advanced Directives 10/05/2018 11/13/2017 09/04/2017 07/10/2017 03/09/2017 11/03/2016 06/27/2016  Does Patient Have a Medical Advance Directive? Yes Yes Yes Yes Yes Yes Yes  Type of Advance Directive Living will Living will Living will Living will Living will Living will [place]  Does patient want to make changes to medical advance directive? No - Patient declined No - Patient declined No - Patient declined No - Patient declined - - -  Copy of [place] in Chart? - - - - - - Yes  Would patient like information on creating a medical advance directive? No - Patient declined - - - - - -    Tobacco Social History   Tobacco Use  Smoking Status Former Smoker  . Last attempt to quit: 06/09/2010  . Years since quitting: 8.3  Smokeless Tobacco Never Used     Counseling given: Not Answered   Clinical Intake:  Pre-visit preparation completed: No  Pain : No/denies pain     Nutritional Risks: None Diabetes: No  How often do you need to have someone help you when you read instructions, pamphlets, or other written materials from your doctor or pharmacy?: 1 - Never What is the last grade level you completed in school?: 12 grade  Interpreter Needed?: No  Information entered by :: [name] Ngetich FNP-C   Past Medical History:  Diagnosis Date  . Allergic rhinitis, cause unspecified 09/08/2006  . Carpal tunnel syndrome 03/16/2009  . Chest pain, unspecified 08/20/2012  . Chest pain, unspecified 12/26/2011  . Concussion, unspecified 10/30/1994  . COPD (chronic obstructive pulmonary  disease) ([place]) 02/24/2006  . Corns and callosities 04/11/2007  . Dermatophytosis of the body   . Disturbance of skin sensation   . Diverticulosis of colon (without mention of hemorrhage) 06/14/2010  . Edema of male genital organs 08/20/2012  . Elevated blood pressure reading without diagnosis of hypertension 06/14/2010  . Encounter for long-term (current) use of other medications   . Hypercholesterolemia   . Hypertrophy of prostate without urinary obstruction and other lower urinary tract symptoms (LUTS)   . 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 Lumbago 05/11/2009  . Nocturia 08/20/2012  . Other and unspecified hyperlipidemia 02/24/2006  . Pain in joint, ankle and foot 04/11/2007  . Pterygium, unspecified 03/01/2006  . Rash and other nonspecific skin eruption 12/28/1999  . Seborrheic dermatitis, unspecified 03/21/2001  . 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 Tobacco use disorder   . Ulcer of other part of foot 03/16/2007  . Unspecified constipation 05/11/2009  . Urinary tract infection, site not specified 05/11/2009  . Ventral hernia, unspecified, without mention of obstruction or gangrene    Past Surgical History:  Procedure Laterality Date  . ELBOW SURGERY Right   . EYE SURGERY     Cataract Removal  . FRACTURE SURGERY  1986   right hip, right elbow  . KNEE SURGERY Right 01/2007   knee cap injury  . SKIN CANCER EXCISION  07/2014   Back and head   . TONSILLECTOMY  1948   Family History  Problem Relation Age of Onset  . Diabetes Mother   . Hypertension Mother   . Cancer Father    Social History   Socioeconomic History  . Marital status: Married    Spouse name: Not on file  . Number of children: Not on file  . Years of education: Not on file  . Highest education level: Not on file  Occupational History  . Not on file  Social Needs  . Financial resource strain: Not hard at all  . Food insecurity:    Worry: Never  true    Inability: Never true  . Transportation needs:    Medical: No    Non-medical: No  Tobacco Use  . Smoking status: Former Smoker    Last attempt to quit: 06/09/2010    Years since quitting: 8.3  . Smokeless tobacco: Never Used  Substance and Sexual Activity  . Alcohol use: No    Alcohol/week: 0.0 standard drinks  . Drug use: No  . Sexual activity: Not Currently  Lifestyle  . Physical activity:    Days per week: 0 days    Minutes per session: 0 min  . Stress: Only a little  Relationships  . Social connections:    Talks on phone: Once a week    Gets together: Once a week    Attends religious service: More than 4 times per year    Active member of club or organization: No    Attends meetings of clubs or organizations: Never    Relationship status: Married  Other Topics Concern  . Not on file  Social History Narrative  . Not on file    Outpatient Encounter Medications as of 10/05/2018  Medication Sig  . aspirin 81 MG tablet Take 81 mg by mouth daily.   Marland Kitchen ibuprofen (ADVIL,MOTRIN) 200 MG tablet Take 400 mg by mouth every 4 (four) hours as needed for fever, headache, mild pain, moderate pain or cramping.  Marland Kitchen omeprazole (PRILOSEC) 20 MG capsule TAKE 1 CAPSULE (20 MG TOTAL) BY MOUTH TWO TIMES DAILY BEFORE A MEAL.  . tamsulosin (FLOMAX) 0.4 MG CAPS capsule TAKE 1 CAPSULE EVERY DAY  . valsartan (DIOVAN) 80 MG tablet TAKE 1 TABLET EVERY DAY   No facility-administered encounter medications on file as of 10/05/2018.     Activities of Daily Living In your present state of health, do you have any difficulty performing the following activities: 10/05/2018  Hearing? Y  Comment wears hearing aids   Vision? N  Difficulty concentrating or making decisions? N  Walking or climbing stairs? N  Dressing or bathing? N  Doing errands, shopping? N  Preparing Food and eating ? N  Using the Toilet? N  In the past six months, have you accidently leaked urine? N  Do you have problems with loss  of bowel control? N  Managing your Medications? N  Managing your Finances? N  Housekeeping or managing your Housekeeping? N  Some recent data might be hidden    Patient Care Team: Gayland Curry, DO as PCP - General (Geriatric Medicine) Warden Fillers, MD as Consulting Physician (Ophthalmology)   Assessment:   This is a routine wellness examination for [place].  Exercise Activities and Dietary recommendations Current Exercise Habits: Home exercise routine, Type of exercise: Other - see comments(yard work ), Time (Minutes): 20, Frequency (Times/Week): 3, Weekly Exercise (Minutes/Week): 60, Intensity: Mild, Exercise limited by: None identified  Goals    . <enter goal here>  Starting 06/27/16, I will maintain my current lifestyle.        Fall Risk Fall Risk  10/05/2018 05/21/2018 11/13/2017 09/04/2017 07/10/2017  Falls in the past year? 0 0 No No No  Number falls in past yr: 0 0 - - -  Injury with Fall? 0 0 - - -   Is the patient's home free of loose throw rugs in walkways, pet beds, electrical cords, etc?   no      Grab bars in the bathroom? no      Handrails on the stairs?   no      Adequate lighting?   yes  Depression Screen PHQ 2/9 Scores 10/05/2018 05/21/2018 11/13/2017 09/04/2017  PHQ - 2 Score 0 0 0 0    Cognitive Function MMSE - Mini Mental State Exam 09/04/2017 06/27/2016 04/03/2015  Orientation to time 5 5 4   Orientation to Place 5 5 5   Registration 3 3 3   Attention/ Calculation 4 4 5   Recall 1 1 2   Language- name 2 objects 2 2 2   Language- repeat 1 1 1   Language- follow 3 step command 3 3 3   Language- read & follow direction 1 1 1   Write a sentence 1 1 1   Copy design 1 0 1  Total score 27 26 28      6CIT Screen 10/05/2018  What Year? 0 points  What month? 0 points  What time? 0 points  Count back from 20 0 points  Months in reverse 0 points  Repeat phrase 0 points  Total Score 0    Immunization History  Administered Date(s) Administered  . Influenza,  High Dose Seasonal PF 03/09/2017, 05/21/2018  . Influenza,inj,Quad PF,6+ Mos 03/31/2014, 04/03/2015, 02/25/2016  . Pneumococcal Conjugate-13 07/18/2013  . Pneumococcal Polysaccharide-23 10/02/2014  . Tdap 06/10/2005, 12/03/2013    Qualifies for Shingles Vaccine? Declined due to cost   Screening Tests Health Maintenance  Topic Date Due  . INFLUENZA VACCINE  01/05/2019  . TETANUS/TDAP  12/04/2023  . PNA vac Low Risk Adult  Completed   Cancer Screenings: Lung: Low Dose CT Chest recommended if Age 31-80 years, 30 pack-year currently smoking OR have quit w/in 15years. Patient does not qualify. Colorectal: up to date   Additional Screenings: Hepatitis C Screening: low risk       Plan:   - encouraged low carbohydrate,low saturated fats,high vegetable diet and increase physical activity/exercise at least X 3 per week  for 30 minutes.    I have personally reviewed and noted the following in the patient's chart:   . Medical and social history . Use of alcohol, tobacco or illicit drugs  . Current medications and supplements . Functional ability and status . Nutritional status . Physical activity . Advanced directives . List of other physicians . Hospitalizations, surgeries, and ER visits in previous 12 months . Vitals . Screenings to include cognitive, depression, and falls . Referrals and appointments  In addition, I have reviewed and discussed with patient certain preventive protocols, quality metrics, and best practice recommendations. A written personalized care plan for preventive services as well as general preventive health recommendations were provided to patient.   Sandrea Hughs, NP  10/05/2018

## 2018-10-09 ENCOUNTER — Other Ambulatory Visit: Payer: Self-pay | Admitting: Internal Medicine

## 2018-10-09 DIAGNOSIS — Z8711 Personal history of peptic ulcer disease: Secondary | ICD-10-CM

## 2018-11-12 ENCOUNTER — Other Ambulatory Visit: Payer: Self-pay

## 2018-11-12 ENCOUNTER — Other Ambulatory Visit: Payer: Medicare HMO

## 2018-11-12 DIAGNOSIS — R739 Hyperglycemia, unspecified: Secondary | ICD-10-CM | POA: Diagnosis not present

## 2018-11-12 DIAGNOSIS — I10 Essential (primary) hypertension: Secondary | ICD-10-CM | POA: Diagnosis not present

## 2018-11-12 DIAGNOSIS — E7800 Pure hypercholesterolemia, unspecified: Secondary | ICD-10-CM | POA: Diagnosis not present

## 2018-11-13 ENCOUNTER — Encounter: Payer: Self-pay | Admitting: *Deleted

## 2018-11-13 LAB — CBC WITH DIFFERENTIAL/PLATELET
Absolute Monocytes: 383 cells/uL (ref 200–950)
Basophils Absolute: 22 cells/uL (ref 0–200)
Basophils Relative: 0.4 %
Eosinophils Absolute: 189 cells/uL (ref 15–500)
Eosinophils Relative: 3.5 %
HCT: 40.2 % (ref 38.5–50.0)
Hemoglobin: 13.5 g/dL (ref 13.2–17.1)
Lymphs Abs: 1183 cells/uL (ref 850–3900)
MCH: 29.5 pg (ref 27.0–33.0)
MCHC: 33.6 g/dL (ref 32.0–36.0)
MCV: 87.8 fL (ref 80.0–100.0)
MPV: 10.1 fL (ref 7.5–12.5)
Monocytes Relative: 7.1 %
Neutro Abs: 3623 cells/uL (ref 1500–7800)
Neutrophils Relative %: 67.1 %
Platelets: 226 10*3/uL (ref 140–400)
RBC: 4.58 10*6/uL (ref 4.20–5.80)
RDW: 12.2 % (ref 11.0–15.0)
Total Lymphocyte: 21.9 %
WBC: 5.4 10*3/uL (ref 3.8–10.8)

## 2018-11-13 LAB — COMPLETE METABOLIC PANEL WITH GFR
AG Ratio: 1.3 (calc) (ref 1.0–2.5)
ALT: 7 U/L — ABNORMAL LOW (ref 9–46)
AST: 14 U/L (ref 10–35)
Albumin: 3.9 g/dL (ref 3.6–5.1)
Alkaline phosphatase (APISO): 59 U/L (ref 35–144)
BUN/Creatinine Ratio: 11 (calc) (ref 6–22)
BUN: 13 mg/dL (ref 7–25)
CO2: 28 mmol/L (ref 20–32)
Calcium: 9.1 mg/dL (ref 8.6–10.3)
Chloride: 103 mmol/L (ref 98–110)
Creat: 1.2 mg/dL — ABNORMAL HIGH (ref 0.70–1.18)
GFR, Est African American: 67 mL/min/{1.73_m2} (ref >=60)
GFR, Est Non African American: 58 mL/min/{1.73_m2} — ABNORMAL LOW (ref >=60)
Globulin: 2.9 g/dL (calc) (ref 1.9–3.7)
Glucose, Bld: 97 mg/dL (ref 65–99)
Potassium: 4.6 mmol/L (ref 3.5–5.3)
Sodium: 137 mmol/L (ref 135–146)
Total Bilirubin: 0.4 mg/dL (ref 0.2–1.2)
Total Protein: 6.8 g/dL (ref 6.1–8.1)

## 2018-11-13 LAB — LIPID PANEL
Cholesterol: 260 mg/dL — ABNORMAL HIGH (ref <200)
HDL: 50 mg/dL (ref >=40)
LDL Cholesterol (Calc): 192 mg/dL (calc) — ABNORMAL HIGH
Non-HDL Cholesterol (Calc): 210 mg/dL (calc) — ABNORMAL HIGH (ref <130)
Total CHOL/HDL Ratio: 5.2 (calc) — ABNORMAL HIGH (ref <5.0)
Triglycerides: 78 mg/dL (ref <150)

## 2018-11-13 LAB — HEMOGLOBIN A1C
Hgb A1c MFr Bld: 5.7 % of total Hgb — ABNORMAL HIGH (ref <5.7)
Mean Plasma Glucose: 117 (calc)
eAG (mmol/L): 6.5 (calc)

## 2018-11-19 ENCOUNTER — Encounter: Payer: Self-pay | Admitting: Internal Medicine

## 2018-11-19 ENCOUNTER — Other Ambulatory Visit: Payer: Self-pay

## 2018-11-19 ENCOUNTER — Ambulatory Visit (INDEPENDENT_AMBULATORY_CARE_PROVIDER_SITE_OTHER): Payer: Medicare HMO | Admitting: Internal Medicine

## 2018-11-19 VITALS — BP 130/60 | HR 63 | Temp 98.2°F | Ht 67.0 in | Wt 138.0 lb

## 2018-11-19 DIAGNOSIS — Z1211 Encounter for screening for malignant neoplasm of colon: Secondary | ICD-10-CM | POA: Diagnosis not present

## 2018-11-19 DIAGNOSIS — N401 Enlarged prostate with lower urinary tract symptoms: Secondary | ICD-10-CM | POA: Diagnosis not present

## 2018-11-19 DIAGNOSIS — K5901 Slow transit constipation: Secondary | ICD-10-CM | POA: Diagnosis not present

## 2018-11-19 DIAGNOSIS — J449 Chronic obstructive pulmonary disease, unspecified: Secondary | ICD-10-CM

## 2018-11-19 DIAGNOSIS — N138 Other obstructive and reflux uropathy: Secondary | ICD-10-CM | POA: Diagnosis not present

## 2018-11-19 DIAGNOSIS — E7800 Pure hypercholesterolemia, unspecified: Secondary | ICD-10-CM

## 2018-11-19 DIAGNOSIS — K219 Gastro-esophageal reflux disease without esophagitis: Secondary | ICD-10-CM

## 2018-11-19 DIAGNOSIS — I10 Essential (primary) hypertension: Secondary | ICD-10-CM | POA: Diagnosis not present

## 2018-11-19 DIAGNOSIS — Z0000 Encounter for general adult medical examination without abnormal findings: Secondary | ICD-10-CM

## 2018-11-19 NOTE — Progress Notes (Signed)
Provider:  Rexene Edison. Mariea Clonts, D.O., C.M.D. Location:   [place]   Place of Service:   clinic  Previous PCP: Gayland Curry, DO Patient Care Team: Gayland Curry, DO as PCP - General (Geriatric Medicine) Warden Fillers, MD as Consulting Physician (Ophthalmology)  Extended Emergency Contact Information Primary Emergency Contact: [facility] Address: [place], [place] [address] [place] Phone: [phone] Relation: Spouse  Code Status: FULL CODE  Goals of Care: Advanced Directive information Advanced Directives 10/05/2018  Does Patient Have a Medical Advance Directive? Yes  Type of Advance Directive Living will  Does patient want to make changes to medical advance directive? No - Patient declined  Copy of [place] in Chart? -  Would patient like information on creating a medical advance directive? No - Patient declined   Chief Complaint  Patient presents with  . Annual Exam    CPE    HPI: Patient is a 78 y.o. male seen today for an annual physical exam.    He got a tick off of him 2-3 weeks ago.  It was buried on his right lower abdomen area--had to pull and pull.  It itched for a week afterwards and scratched the scab off.  No rash though.  No fever.    COPD:  Breathing well lately.  No trouble.  Was getting screening CTs--no changes year to year thus far.  Has not actually seen pulmonary since 2017.    His abdomen hurts each morning.  It goes away after he has his morning coffee.  It's where his hernia had been.  Says it's tolerable and not bad.  Happens now and then.  Does not wake him at night.  Discussed adhesions.   Hyperlipidemia:  Has worsened.  They are eating a lot of red meat and butter on his potatoes.  They do eat veggies with it.  He is agreeable to seeing the lipid clinic due to LDL over 190.  We've tried several statins over the years (I have and Dr. Nyoka Cowden did prior to his retirement).     Past Medical History:  Diagnosis Date  . Allergic rhinitis, cause unspecified 09/08/2006  . Carpal tunnel syndrome 03/16/2009  . Chest pain, unspecified 08/20/2012  . Chest pain, unspecified 12/26/2011  . Concussion, unspecified 10/30/1994  . COPD (chronic obstructive pulmonary disease) ([place]) 02/24/2006  . Corns and callosities 04/11/2007  . Dermatophytosis of the body   . Disturbance of skin sensation   . Diverticulosis of colon (without mention of hemorrhage) 06/14/2010  . Edema of male genital organs 08/20/2012  . Elevated blood pressure reading without diagnosis of hypertension 06/14/2010  . Encounter for long-term (current) use of other medications   . Hypercholesterolemia   . Hypertrophy of prostate without urinary obstruction and other lower urinary tract symptoms (LUTS)   . 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 Lumbago 05/11/2009  . Nocturia 08/20/2012  . Other and unspecified hyperlipidemia 02/24/2006  . Pain in joint, ankle and foot 04/11/2007  . Pterygium, unspecified 03/01/2006  . Rash and other nonspecific skin eruption 12/28/1999  . Seborrheic dermatitis, unspecified 03/21/2001  . 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 Tobacco use disorder   . Ulcer of other part of foot 03/16/2007  . Unspecified constipation 05/11/2009  . Urinary tract infection, site not specified 05/11/2009  . Ventral hernia, unspecified, without mention of  obstruction or gangrene    Past Surgical History:  Procedure Laterality Date  . ELBOW SURGERY Right   . EYE SURGERY     Cataract Removal  . FRACTURE SURGERY  1986   right hip, right elbow  . KNEE SURGERY Right 01/2007   knee cap injury  . SKIN CANCER EXCISION  07/2014   Back and head   . TONSILLECTOMY  1948    reports that he quit smoking about 8 years ago. He has never used smokeless tobacco. He reports that he does not drink alcohol or use drugs.  Functional Status Survey:     Family History  Problem Relation Age of Onset  . Diabetes Mother   . Hypertension Mother   . Cancer Father     Health Maintenance  Topic Date Due  . INFLUENZA VACCINE  01/05/2019  . TETANUS/TDAP  12/04/2023  . PNA vac Low Risk Adult  Completed    Allergies  Allergen Reactions  . Zocor [Simvastatin] Shortness Of Breath    Sob and itching  . Ace Inhibitors Cough    Tolerating ARB instead  . Lipitor [Atorvastatin]     Muscle and bone pain  . Pravachol [Pravastatin Sodium] Other (See Comments)    myalgias    Outpatient Encounter Medications as of 11/19/2018  Medication Sig  . aspirin 81 MG tablet Take 81 mg by mouth daily.   Marland Kitchen ibuprofen (ADVIL,MOTRIN) 200 MG tablet Take 400 mg by mouth every 4 (four) hours as needed for fever, headache, mild pain, moderate pain or cramping.  Marland Kitchen omeprazole (PRILOSEC) 20 MG capsule TAKE 1 CAPSULE (20 MG TOTAL) BY MOUTH TWO TIMES DAILY BEFORE A MEAL.  . tamsulosin (FLOMAX) 0.4 MG CAPS capsule TAKE 1 CAPSULE EVERY DAY  . valsartan (DIOVAN) 80 MG tablet TAKE 1 TABLET EVERY DAY   No facility-administered encounter medications on file as of 11/19/2018.     Review of Systems  Constitutional: Negative for chills, fever and malaise/fatigue.  HENT: Positive for hearing loss. Negative for congestion.        Hearing aids  Eyes: Positive for blurred vision.       Has macular degeneration on eye vitamins  Respiratory: Negative for cough and shortness of breath.   Cardiovascular: Negative for chest pain, palpitations and leg swelling.  Gastrointestinal: Positive for abdominal pain and constipation. Negative for blood in stool, diarrhea, melena, nausea and vomiting.  Genitourinary: Positive for frequency. Negative for dysuria, hematuria and urgency.       Nocturia x 2 now  Musculoskeletal: Negative for back pain, falls and joint pain.  Skin: Positive for itching. Negative for rash.       Dry skin on feet and thick toenails  Neurological: Negative for  dizziness and loss of consciousness.  Endo/Heme/Allergies: Does not bruise/bleed easily.  Psychiatric/Behavioral: Negative for depression and memory loss. The patient is not nervous/anxious and does not have insomnia.     Vitals:   11/19/18 0851  BP: 130/60  Pulse: 63  Temp: 98.2 F (36.8 C)  TempSrc: Oral  SpO2: 98%  Weight: 138 lb (62.6 kg)  Height: 5\' 7"  (1.702 m)   Body mass index is 21.61 kg/m. Physical Exam Vitals signs reviewed.  Constitutional:      General: He is not in acute distress.    Appearance: Normal appearance. He is normal weight. He is not toxic-appearing.  HENT:     Head: Normocephalic and atraumatic.     Right Ear: Tympanic membrane, ear canal and external  ear normal.     Left Ear: Tympanic membrane, ear canal and external ear normal.     Ears:     Comments: Some cerumen in canals, but not obstructed, uses hearing aids    Nose: Nose normal.     Mouth/Throat:     Pharynx: Oropharynx is clear.  Eyes:     Extraocular Movements: Extraocular movements intact.     Conjunctiva/sclera: Conjunctivae normal.     Pupils: Pupils are equal, round, and reactive to light.  Cardiovascular:     Rate and Rhythm: Regular rhythm. Bradycardia present.     Pulses: Normal pulses.     Heart sounds: Normal heart sounds.  Pulmonary:     Effort: Pulmonary effort is normal.     Breath sounds: Normal breath sounds.  Abdominal:     General: Bowel sounds are normal. There is no distension.     Palpations: Abdomen is soft. There is no mass.     Tenderness: There is no abdominal tenderness.  Musculoskeletal: Normal range of motion.  Skin:    General: Skin is warm and dry.     Comments: Dry scaly skin lower legs, thick toenails  Neurological:     General: No focal deficit present.     Mental Status: He is alert and oriented to person, place, and time. Mental status is at baseline.     Cranial Nerves: No cranial nerve deficit.     Motor: No weakness.     Gait: Gait normal.    Psychiatric:        Mood and Affect: Mood normal.        Behavior: Behavior normal.        Thought Content: Thought content normal.        Judgment: Judgment normal.     Labs reviewed: Basic Metabolic Panel: Recent Labs    11/12/18 0830  NA 137  K 4.6  CL 103  CO2 28  GLUCOSE 97  BUN 13  CREATININE 1.20*  CALCIUM 9.1   Liver Function Tests: Recent Labs    11/12/18 0830  AST 14  ALT 7*  BILITOT 0.4  PROT 6.8   No results for input(s): LIPASE, AMYLASE in the last 8760 hours. No results for input(s): AMMONIA in the last 8760 hours. CBC: Recent Labs    11/12/18 0830  WBC 5.4  NEUTROABS 3,623  HGB 13.5  HCT 40.2  MCV 87.8  PLT 226   Cardiac Enzymes: No results for input(s): CKTOTAL, CKMB, CKMBINDEX, TROPONINI in the last 8760 hours. BNP: Invalid input(s): POCBNP Lab Results  Component Value Date   HGBA1C 5.7 (H) 11/12/2018   No results found for: TSH No results found for: VITAMINB12 No results found for: FOLATE No results found for: IRON, TIBC, FERRITIN  Imaging and Procedures Recently: EKG today:  Sinus bradycardia at 57bpm.  No acute changes, ischemia or infarct.  Assessment/Plan 1. Annual physical exam - annual exam performed today - Ambulatory referral to Gastroenterology - CBC with Differential/Platelet; Future - COMPLETE METABOLIC PANEL WITH GFR; Future - Hemoglobin A1c; Future  2. Essential hypertension, benign -bp at goal with current regimen, continued same - EKG 12-Lead  3. COPD GOLD II  Not on meds for this -no current symptoms and no wheezing on exam  4. 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 Hypercholesterolemia - has been uncontrolled for years, he has not  tolerated statins with me or with Dr. Nyoka Cowden before that - AMB Referral to [facility] - Lipid panel; Future  7. Encounter for screening colonoscopy - pt reports he got letter from Dr. Perley Jain office for f/u csope.   - Ambulatory referral to Gastroenterology  8. Gastroesophageal reflux disease, esophagitis presence not specified - only using PPI once a day now instead of twice a day before meals and not having any difficulty with his gerd  Labs/tests ordered:  Cbc, cmp, flp, hba1c F/u in 6 mos for med mgt  Thomas Reyes, D.O. [facility] [address], [place] [address] Cell Phone (Mon-Fri 8am-5pm):  [phone] On Call:  [phone] & follow prompts after 5pm & weekends Office Phone:  [phone] Office Fax:  [phone]

## 2018-11-19 NOTE — Patient Instructions (Signed)
Soak your feet 2 times per week to help with the thick nails and use the coconut cream you've got for the dry skin.    Cut down on your red meat intake.  Increase your chicken and fish.  You really should not have red meat more than once.  You could also add pork chops. Avoid frying food.  Also be careful not to overdo the butter.  I will send you to see Dr. Debara Pickett in the lipid clinic.  Dr. Melvyn Novas has been following your CT chest for the pulmonary nodules.  I put in the referral to follow-up on your colonoscopy with Dr. Watt Climes.

## 2018-11-20 ENCOUNTER — Telehealth: Payer: Self-pay | Admitting: Internal Medicine

## 2018-11-20 DIAGNOSIS — Z961 Presence of intraocular lens: Secondary | ICD-10-CM | POA: Diagnosis not present

## 2018-11-20 DIAGNOSIS — H11042 Peripheral pterygium, stationary, left eye: Secondary | ICD-10-CM | POA: Diagnosis not present

## 2018-11-20 DIAGNOSIS — H10413 Chronic giant papillary conjunctivitis, bilateral: Secondary | ICD-10-CM | POA: Diagnosis not present

## 2018-11-20 DIAGNOSIS — H04123 Dry eye syndrome of bilateral lacrimal glands: Secondary | ICD-10-CM | POA: Diagnosis not present

## 2018-11-20 DIAGNOSIS — H353131 Nonexudative age-related macular degeneration, bilateral, early dry stage: Secondary | ICD-10-CM | POA: Diagnosis not present

## 2018-11-20 NOTE — Telephone Encounter (Signed)
LMTCB, patient needs to reschedule new patient appt, please reschedule on lipid clinic day 30 min appt.  Thank you.

## 2018-12-04 ENCOUNTER — Telehealth: Payer: Self-pay | Admitting: Gastroenterology

## 2018-12-04 NOTE — Telephone Encounter (Signed)
DOD December 04, 2018 AM   Dr. Fuller Plan, there is a referral for pt to have a colonoscopy.  Previous colonoscopy report from 2014 by Dr. Watt Climes is in [place] and will be sent to you for review.

## 2018-12-18 NOTE — Telephone Encounter (Signed)
Dr. Fuller Plan, have you seen these records?

## 2018-12-18 NOTE — Telephone Encounter (Signed)
Please disregard.  Pt will continue care with Dr. Watt Climes as per pt's wife.

## 2018-12-18 NOTE — Telephone Encounter (Signed)
Please check the status of these records.

## 2018-12-18 NOTE — Telephone Encounter (Signed)
Per Dr. Fuller Plan please get previous colon and path reports from [facility] dating back to show first polyp to review again.

## 2018-12-18 NOTE — Telephone Encounter (Signed)
I reviewed the records and left notes on the paper referral on my desk before July 3rd.

## 2018-12-18 NOTE — Telephone Encounter (Signed)
Previous colon resent to Dr. Lynne Leader box--no pathology report as no polyps were found.

## 2018-12-27 ENCOUNTER — Ambulatory Visit: Payer: Medicare HMO | Admitting: Internal Medicine

## 2019-01-28 ENCOUNTER — Ambulatory Visit: Payer: Medicare HMO | Admitting: Internal Medicine

## 2019-04-10 ENCOUNTER — Ambulatory Visit: Payer: Medicare HMO | Admitting: Internal Medicine

## 2019-04-11 ENCOUNTER — Encounter: Payer: Self-pay | Admitting: Internal Medicine

## 2019-04-16 ENCOUNTER — Telehealth: Payer: Self-pay | Admitting: Internal Medicine

## 2019-04-16 NOTE — Telephone Encounter (Signed)
Pt refuses to go to cardiology lipid clinic despite LDL 192 which puts him at high risk for cardiovascular events.  Code status is full code.  He has not tolerated typical lipid therapies like statins, fibrates.  He has made minimal dietary changes since I've known him to improve his cholesterol.

## 2019-05-20 ENCOUNTER — Ambulatory Visit: Payer: Medicare HMO | Admitting: Internal Medicine

## 2019-05-23 ENCOUNTER — Other Ambulatory Visit: Payer: Medicare HMO

## 2019-05-23 ENCOUNTER — Other Ambulatory Visit: Payer: Self-pay

## 2019-05-23 DIAGNOSIS — E7800 Pure hypercholesterolemia, unspecified: Secondary | ICD-10-CM | POA: Diagnosis not present

## 2019-05-23 DIAGNOSIS — Z0000 Encounter for general adult medical examination without abnormal findings: Secondary | ICD-10-CM

## 2019-05-24 LAB — COMPLETE METABOLIC PANEL WITH GFR
AG Ratio: 1.4 (calc) (ref 1.0–2.5)
ALT: 8 U/L — ABNORMAL LOW (ref 9–46)
AST: 17 U/L (ref 10–35)
Albumin: 3.8 g/dL (ref 3.6–5.1)
Alkaline phosphatase (APISO): 56 U/L (ref 35–144)
BUN/Creatinine Ratio: 10 (calc) (ref 6–22)
BUN: 14 mg/dL (ref 7–25)
CO2: 24 mmol/L (ref 20–32)
Calcium: 8.9 mg/dL (ref 8.6–10.3)
Chloride: 104 mmol/L (ref 98–110)
Creat: 1.4 mg/dL — ABNORMAL HIGH (ref 0.70–1.18)
GFR, Est African American: 55 mL/min/{1.73_m2} — ABNORMAL LOW (ref >=60)
GFR, Est Non African American: 47 mL/min/{1.73_m2} — ABNORMAL LOW (ref >=60)
Globulin: 2.8 g/dL (calc) (ref 1.9–3.7)
Glucose, Bld: 95 mg/dL (ref 65–99)
Potassium: 4.6 mmol/L (ref 3.5–5.3)
Sodium: 139 mmol/L (ref 135–146)
Total Bilirubin: 0.4 mg/dL (ref 0.2–1.2)
Total Protein: 6.6 g/dL (ref 6.1–8.1)

## 2019-05-24 LAB — LIPID PANEL
Cholesterol: 269 mg/dL — ABNORMAL HIGH (ref <200)
HDL: 50 mg/dL (ref >=40)
LDL Cholesterol (Calc): 192 mg/dL (calc) — ABNORMAL HIGH
Non-HDL Cholesterol (Calc): 219 mg/dL (calc) — ABNORMAL HIGH (ref <130)
Total CHOL/HDL Ratio: 5.4 (calc) — ABNORMAL HIGH (ref <5.0)
Triglycerides: 135 mg/dL (ref <150)

## 2019-05-24 LAB — CBC WITH DIFFERENTIAL/PLATELET
Absolute Monocytes: 312 cells/uL (ref 200–950)
Basophils Absolute: 9 cells/uL (ref 0–200)
Basophils Relative: 0.2 %
Eosinophils Absolute: 211 cells/uL (ref 15–500)
Eosinophils Relative: 4.8 %
HCT: 39.7 % (ref 38.5–50.0)
Hemoglobin: 12.7 g/dL — ABNORMAL LOW (ref 13.2–17.1)
Lymphs Abs: 1104 cells/uL (ref 850–3900)
MCH: 26.3 pg — ABNORMAL LOW (ref 27.0–33.0)
MCHC: 32 g/dL (ref 32.0–36.0)
MCV: 82.2 fL (ref 80.0–100.0)
MPV: 11 fL (ref 7.5–12.5)
Monocytes Relative: 7.1 %
Neutro Abs: 2763 cells/uL (ref 1500–7800)
Neutrophils Relative %: 62.8 %
Platelets: 234 10*3/uL (ref 140–400)
RBC: 4.83 10*6/uL (ref 4.20–5.80)
RDW: 13.1 % (ref 11.0–15.0)
Total Lymphocyte: 25.1 %
WBC: 4.4 10*3/uL (ref 3.8–10.8)

## 2019-05-24 LAB — HEMOGLOBIN A1C
Hgb A1c MFr Bld: 6 % of total Hgb — ABNORMAL HIGH (ref <5.7)
Mean Plasma Glucose: 126 (calc)
eAG (mmol/L): 7 (calc)

## 2019-05-24 NOTE — Progress Notes (Signed)
Labs are about the same except the sugar average trending up closer to diabetes.  Bad cholesterol is still very high.  Kidneys have fluctuated over time and are a little worse right now again.  He's mildly anemic now which may be related to his kidney function declining/chronic disease.

## 2019-05-27 ENCOUNTER — Ambulatory Visit (INDEPENDENT_AMBULATORY_CARE_PROVIDER_SITE_OTHER): Payer: Medicare HMO | Admitting: Internal Medicine

## 2019-05-27 ENCOUNTER — Encounter: Payer: Self-pay | Admitting: Internal Medicine

## 2019-05-27 ENCOUNTER — Other Ambulatory Visit: Payer: Self-pay

## 2019-05-27 VITALS — BP 122/60 | HR 53 | Temp 97.7°F | Ht 67.0 in | Wt 138.0 lb

## 2019-05-27 DIAGNOSIS — N138 Other obstructive and reflux uropathy: Secondary | ICD-10-CM | POA: Diagnosis not present

## 2019-05-27 DIAGNOSIS — R739 Hyperglycemia, unspecified: Secondary | ICD-10-CM | POA: Diagnosis not present

## 2019-05-27 DIAGNOSIS — R0600 Dyspnea, unspecified: Secondary | ICD-10-CM

## 2019-05-27 DIAGNOSIS — E7800 Pure hypercholesterolemia, unspecified: Secondary | ICD-10-CM | POA: Diagnosis not present

## 2019-05-27 DIAGNOSIS — J449 Chronic obstructive pulmonary disease, unspecified: Secondary | ICD-10-CM | POA: Diagnosis not present

## 2019-05-27 DIAGNOSIS — N401 Enlarged prostate with lower urinary tract symptoms: Secondary | ICD-10-CM | POA: Diagnosis not present

## 2019-05-27 DIAGNOSIS — H3530 Unspecified macular degeneration: Secondary | ICD-10-CM | POA: Diagnosis not present

## 2019-05-27 DIAGNOSIS — R0609 Other forms of dyspnea: Secondary | ICD-10-CM

## 2019-05-27 DIAGNOSIS — Z23 Encounter for immunization: Secondary | ICD-10-CM | POA: Diagnosis not present

## 2019-05-27 DIAGNOSIS — R55 Syncope and collapse: Secondary | ICD-10-CM

## 2019-05-27 NOTE — Progress Notes (Signed)
Location:  [facility] clinic  Provider: Dr. Hollace Kinnier   Goals of Care:  Advanced Directives 10/05/2018  Does Patient Have a Medical Advance Directive? Yes  Type of Advance Directive Living will  Does patient want to make changes to medical advance directive? No - Patient declined  Copy of [place] in Chart? -  Would patient like information on creating a medical advance directive? No - Patient declined     Chief Complaint  Patient presents with  . Medical Management of Chronic Issues    78mth follow-up    HPI: Patient is a 78 y.o. male seen today for medical management of chronic diseases.    Wife present for encounter.   Labs reviewed with patient.   He admits to eating unhealthy foods. He has decreased fried foods, but still consumes a lot of processed, high fat foods. Eats about one large meal a day. Has tried statin medication in the past, but had muscle aches.   Received his flu vaccine today.  Asking about CT of his lungs, when he can have his next imaging done.   Having a study done in February for his cholesterol. Does not know what it is called or any other details. Admits to rescheduling this appointment many times. Does not understand what the appointment is for.   He is having episodes of dizziness with exertion. He also claims he is more short of breath with exertion. Wife claims these episodes occurred during the summer months more than now.   He complains of lower leg itchiness. Denies using any lotion after showering. Admits to itching and breaking the skin.   Saw dentist one month ago. It has been recommended that he get dentures. They plan on having dentures placed in 2021, after they pay for wife to have hers placed first.   Last eye exam was 6 months ago.              Past Medical History:  Diagnosis Date  . Allergic rhinitis, cause unspecified 09/08/2006  . Carpal tunnel syndrome 03/16/2009  . Chest pain, unspecified  08/20/2012  . Chest pain, unspecified 12/26/2011  . Concussion, unspecified 10/30/1994  . COPD (chronic obstructive pulmonary disease) ([place]) 02/24/2006  . Corns and callosities 04/11/2007  . Dermatophytosis of the body   . Disturbance of skin sensation   . Diverticulosis of colon (without mention of hemorrhage) 06/14/2010  . Edema of male genital organs 08/20/2012  . Elevated blood pressure reading without diagnosis of hypertension 06/14/2010  . Encounter for long-term (current) use of other medications   . Hypercholesterolemia   . Hypertrophy of prostate without urinary obstruction and other lower urinary tract symptoms (LUTS)   . 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 Lumbago 05/11/2009  . Nocturia 08/20/2012  . Other and unspecified hyperlipidemia 02/24/2006  . Pain in joint, ankle and foot 04/11/2007  . Pterygium, unspecified 03/01/2006  . Rash and other nonspecific skin eruption 12/28/1999  . Seborrheic dermatitis, unspecified 03/21/2001  . 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 Tobacco use disorder   . Ulcer of other part of foot 03/16/2007  . Unspecified constipation 05/11/2009  . Urinary tract infection, site not specified 05/11/2009  . Ventral hernia, unspecified, without mention of obstruction or gangrene     Past Surgical History:  Procedure Laterality Date  . ELBOW SURGERY Right   . EYE SURGERY     Cataract Removal  . FRACTURE SURGERY  1986   right hip, right elbow  . KNEE SURGERY Right 01/2007   knee cap injury  . SKIN CANCER EXCISION  07/2014   Back and head   . TONSILLECTOMY  1948    Allergies  Allergen Reactions  . Zocor [Simvastatin] Shortness Of Breath    Sob and itching  . Ace Inhibitors Cough    Tolerating ARB instead  . Lipitor [Atorvastatin]     Muscle and bone pain  . Pravachol [Pravastatin Sodium] Other (See Comments)    myalgias    Outpatient Encounter Medications as of 05/27/2019   Medication Sig  . aspirin 81 MG tablet Take 81 mg by mouth daily.   Marland Kitchen ibuprofen (ADVIL,MOTRIN) 200 MG tablet Take 400 mg by mouth every 4 (four) hours as needed for fever, headache, mild pain, moderate pain or cramping.  Marland Kitchen omeprazole (PRILOSEC) 20 MG capsule TAKE 1 CAPSULE (20 MG TOTAL) BY MOUTH TWO TIMES DAILY BEFORE A MEAL.  . tamsulosin (FLOMAX) 0.4 MG CAPS capsule TAKE 1 CAPSULE EVERY DAY  . valsartan (DIOVAN) 80 MG tablet TAKE 1 TABLET EVERY DAY   No facility-administered encounter medications on file as of 05/27/2019.    Review of Systems:  Review of Systems  Constitutional: Negative for activity change, appetite change and fatigue.  HENT: Positive for dental problem and hearing loss. Negative for trouble swallowing.        Bilateral hearing aids  Eyes: Negative for photophobia and visual disturbance.  Respiratory: Positive for shortness of breath. Negative for cough and wheezing.   Cardiovascular: Negative for chest pain and palpitations.  Gastrointestinal: Negative for abdominal pain, constipation, diarrhea and nausea.  Endocrine: Negative for polydipsia, polyphagia and polyuria.  Genitourinary: Negative for dysuria, frequency and hematuria.  Musculoskeletal: Negative.   Skin:       Lower leg itching and dryness   Neurological: Positive for dizziness and light-headedness. Negative for weakness and headaches.  Psychiatric/Behavioral: Negative for dysphoric mood and sleep disturbance. The patient is not nervous/anxious.     Health Maintenance  Topic Date Due  . INFLUENZA VACCINE  01/05/2019  . TETANUS/TDAP  12/04/2023  . PNA vac Low Risk Adult  Completed    Physical Exam: Vitals:   05/27/19 0850  BP: 122/60  Pulse: (!) 53  Temp: 97.7 F (36.5 C)  TempSrc: Oral  SpO2: 97%  Weight: 138 lb (62.6 kg)  Height: 5\' 7"  (1.702 m)   Body mass index is 21.61 kg/m. Physical Exam Vitals reviewed.  Constitutional:      Appearance: Normal appearance. He is normal weight.   Cardiovascular:     Rate and Rhythm: Normal rate and regular rhythm.     Pulses: Normal pulses.     Heart sounds: Normal heart sounds. No murmur.  Pulmonary:     Effort: Pulmonary effort is normal. No respiratory distress.     Breath sounds: Normal breath sounds. No wheezing.     Comments: Rhonchi RUL Abdominal:     General: Abdomen is flat. Bowel sounds are normal.     Palpations: Abdomen is soft.     Tenderness: There is no abdominal tenderness.  Musculoskeletal:        General: Normal range of motion.     Right lower leg: No edema.     Left lower leg: No edema.  Skin:    General: Skin is warm and dry.     Capillary Refill: Capillary refill takes less than 2 seconds.     Comments: Bilateral extremities dry,  scratch marks  Neurological:     General: No focal deficit present.     Mental Status: He is alert and oriented to person, place, and time. Mental status is at baseline.  Psychiatric:        Mood and Affect: Mood normal.        Behavior: Behavior normal.        Thought Content: Thought content normal.        Judgment: Judgment normal.     Labs reviewed: Basic Metabolic Panel: Recent Labs    11/12/18 0830 05/23/19 0832  NA 137 139  K 4.6 4.6  CL 103 104  CO2 28 24  GLUCOSE 97 95  BUN 13 14  CREATININE 1.20* 1.40*  CALCIUM 9.1 8.9   Liver Function Tests: Recent Labs    11/12/18 0830 05/23/19 0832  AST 14 17  ALT 7* 8*  BILITOT 0.4 0.4  PROT 6.8 6.6   No results for input(s): LIPASE, AMYLASE in the last 8760 hours. No results for input(s): AMMONIA in the last 8760 hours. CBC: Recent Labs    11/12/18 0830 05/23/19 0832  WBC 5.4 4.4  NEUTROABS 3,623 2,763  HGB 13.5 12.7*  HCT 40.2 39.7  MCV 87.8 82.2  PLT 226 234   Lipid Panel: Recent Labs    11/12/18 0830 05/23/19 0832  CHOL 260* 269*  HDL 50 50  LDLCALC 192* 192*  TRIG 78 135  CHOLHDL 5.2* 5.4*   Lab Results  Component Value Date   HGBA1C 6.0 (H) 05/23/2019    Procedures since  last visit: No results found.  Assessment/Plan 1. Need for influenza vaccination -  Administer Flu Vaccine QUAD High Dose(Fluad)  2. COPD GOLD II - does not use an medication at this time - suspect increased shortness of breath may be progression in disease - no symptoms on exam  3. Hyperglycemia - hemoglobin A1C 6.0 - recommend low sugar and carb diet - hemoglobin A1C- future  4. BPH with obstruction/lower urinary tract symptoms - stable at this time - continue flomax to prevent increased nocturia - hemoglobin A1C- future  5. Macular degeneration, unspecified laterality, unspecified type - followed by Dr. Katy Fitch - continue scheduled appointments with opthalmology  6. Dyspnea on exertion - suspect COPD and heart disease are contributing to shortness of breath - referral for stress test with cardiology - complete blood count with differential/platelets- future - complete metabolic panel with GFR- future  7. Near syncope - increased exertion and shortness of breath have contributed to these episodes -  referral for stress test with cardiology - increased risk of falling, safety measures discussed with patient - recommend pursed lip breathing during exertion or when feeling short of breath    Labs/tests ordered:  Complete blood panel with differential/platelets, complete metabolic panel with GFR, hemoglobin A1C, lipid panel- future Next appt:  10/08/2019

## 2019-05-27 NOTE — Patient Instructions (Addendum)
Use some moisturizing cream on your legs after bathing.    Do better about following a low fat diet.  Keep your February appt.    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   I would like you to get a stress test done again.  I have ordered it today to be done at cardiology on [address].  Fat and Cholesterol Restricted Eating Plan Eating a diet that limits fat and cholesterol may help lower your risk for heart disease and other conditions. Your body needs fat and cholesterol for basic functions, but eating too much of these things can be harmful to your health. Your health care provider may order lab tests to check your blood fat (lipid) and cholesterol levels. This helps your health care provider understand your risk for certain conditions and whether you need to make diet changes. Work with your health care provider or dietitian to make an eating plan that is right for you. Your plan includes: Limit your fat intake Increase fiber intake  What are tips for following this plan? General guidelines   If you are overweight, work with your health care provider to lose weight safely. Losing just 5-10% of your body weight can improve your overall health and help prevent diseases such as diabetes and heart disease.  Avoid: ? Foods with added sugar. ? Fried foods. ? Foods that contain partially hydrogenated oils, including stick margarine, some tub margarines, cookies, crackers, and other baked goods.  Limit alcohol intake to no more than 1 drink a day for nonpregnant women and 2 drinks a day for men. One drink equals 12 oz of beer, 5 oz of wine, or 1 oz of hard liquor. Reading food labels  Check food labels for: ? Trans fats, partially hydrogenated oils, or high amounts of saturated fat. Avoid foods that contain saturated fat and trans fat. ? The amount of cholesterol in each serving.  Try to eat no more than 200 mg of cholesterol each day. ? The amount of fiber in each serving. Try to eat at least 20-30 g of fiber each day.  Choose foods with healthy fats, such as: ? Monounsaturated and polyunsaturated fats. These include olive and canola oil, flaxseeds, walnuts, almonds, and seeds. ? Omega-3 fats. These are found in foods such as salmon, mackerel, sardines, tuna, flaxseed oil, and ground flaxseeds.  Choose grain products that have whole grains. Look for the word "whole" as the first word in the ingredient list. Cooking  Cook foods using methods other than frying. Baking, boiling, grilling, and broiling are some healthy options.  Eat more home-cooked food and less restaurant, buffet, and fast food.  Avoid cooking using saturated fats. ? Animal sources of saturated fats include meats, butter, and cream. ? Plant sources of saturated fats include palm oil, palm kernel oil, and coconut oil. Meal planning   At meals, imagine dividing your plate into fourths: ? Fill one-half of your plate with vegetables and green salads. ? Fill one-fourth of your plate with whole grains. ? Fill one-fourth of your plate with lean protein foods.  Eat fish that is high in omega-3 fats at least two times a week.  Eat more foods that contain fiber, such as whole grains, beans, apples, broccoli, carrots, peas, and barley. These foods help promote healthy cholesterol levels in the blood. Recommended foods Grains  Whole grains, such as whole wheat or  whole grain breads, crackers, cereals, and pasta. Unsweetened oatmeal, bulgur, barley, quinoa, or brown rice. Corn or whole wheat flour tortillas. Vegetables  Fresh or frozen vegetables (raw, steamed, roasted, or grilled). Green salads. Fruits  All fresh, canned (in natural juice), or frozen fruits. Meats and other protein foods  Ground beef (85% or leaner), grass-fed beef, or beef trimmed of fat. Skinless chicken or [place]. Ground chicken  or [place]. Pork trimmed of fat. All fish and seafood. Egg whites. Dried beans, peas, or lentils. Unsalted nuts or seeds. Unsalted canned beans. Natural nut butters without added sugar and oil. Dairy  Low-fat or nonfat dairy products, such as skim or 1% milk, 2% or reduced-fat cheeses, low-fat and fat-free ricotta or cottage cheese, or plain low-fat and nonfat yogurt. Fats and oils  Tub margarine without trans fats. Light or reduced-fat mayonnaise and salad dressings. Avocado. Olive, canola, sesame, or safflower oils. The items listed above may not be a complete list of recommended foods or beverages. Contact your dietitian for more options. Foods to avoid Grains  White bread. White pasta. White rice. Cornbread. Bagels, pastries, and croissants. Crackers and snack foods that contain trans fat and hydrogenated oils. Vegetables  Vegetables cooked in cheese, cream, or butter sauce. Fried vegetables. Fruits  Canned fruit in heavy syrup. Fruit in cream or butter sauce. Fried fruit. Meats and other protein foods  Fatty cuts of meat. Ribs, chicken wings, bacon, sausage, bologna, salami, chitterlings, fatback, hot dogs, bratwurst, and packaged lunch meats. Liver and organ meats. Whole eggs and egg yolks. Chicken and [place] with skin. Fried meat. Dairy  Whole or 2% milk, cream, half-and-half, and cream cheese. Whole milk cheeses. Whole-fat or sweetened yogurt. Full-fat cheeses. Nondairy creamers and whipped toppings. Processed cheese, cheese spreads, and cheese curds. Beverages  Alcohol. Sugar-sweetened drinks such as sodas, lemonade, and fruit drinks. Fats and oils  Butter, stick margarine, lard, shortening, ghee, or bacon fat. Coconut, palm kernel, and palm oils. Sweets and desserts  Corn syrup, sugars, honey, and molasses. Candy. Jam and jelly. Syrup. Sweetened cereals. Cookies, pies, cakes, donuts, muffins, and ice cream. The items listed above may not be a complete list of foods and  beverages to avoid. Contact your dietitian for more information. Summary  Your body needs fat and cholesterol for basic functions. However, eating too much of these things can be harmful to your health.  Work with your health care provider and dietitian to follow a diet low in fat and cholesterol. Doing this may help lower your risk for heart disease and other conditions.  Choose healthy fats, such as monounsaturated and polyunsaturated fats, and foods high in omega-3 fatty acids.  Eat fiber-rich foods, such as whole grains, beans, peas, fruits, and vegetables.  Limit or avoid alcohol, fried foods, and foods high in saturated fats, partially hydrogenated oils, and sugar. This information is not intended to replace advice given to you by your health care provider. Make sure you discuss any questions you have with your health care provider. Document Released: 05/23/2005 Document Revised: 05/05/2017 Document Reviewed: 02/07/2017 Elsevier Patient Education  2020 [employer].

## 2019-06-06 ENCOUNTER — Telehealth (HOSPITAL_COMMUNITY): Payer: Self-pay

## 2019-06-06 ENCOUNTER — Telehealth: Payer: Self-pay | Admitting: Internal Medicine

## 2019-06-06 NOTE — Telephone Encounter (Signed)
Wife of patient called. Patient is scheduled for a test Wed 06-12-19. Wife wanted to know if she could pick up any new patient paperwork for the patient on Monday, and then the patient could bring it back with him on Wednesday. The wife fills out all of the paperwork. Please advise

## 2019-06-06 NOTE — Telephone Encounter (Signed)
Encounter complete. 

## 2019-06-06 NOTE — Telephone Encounter (Signed)
Called pt to let her know that the paperwork will be up front for her to pick up.

## 2019-06-12 ENCOUNTER — Ambulatory Visit (HOSPITAL_COMMUNITY)
Admission: RE | Admit: 2019-06-12 | Discharge: 2019-06-12 | Disposition: A | Discharge status: Home / Self Care | Payer: Medicare HMO | Source: Ambulatory Visit | Attending: Cardiology | Admitting: Cardiology

## 2019-06-12 ENCOUNTER — Other Ambulatory Visit: Payer: Self-pay

## 2019-06-12 DIAGNOSIS — E7800 Pure hypercholesterolemia, unspecified: Secondary | ICD-10-CM

## 2019-06-12 DIAGNOSIS — R55 Syncope and collapse: Secondary | ICD-10-CM | POA: Insufficient documentation

## 2019-06-12 DIAGNOSIS — R0600 Dyspnea, unspecified: Secondary | ICD-10-CM | POA: Diagnosis not present

## 2019-06-12 DIAGNOSIS — R739 Hyperglycemia, unspecified: Secondary | ICD-10-CM | POA: Insufficient documentation

## 2019-06-12 DIAGNOSIS — R0609 Other forms of dyspnea: Secondary | ICD-10-CM

## 2019-06-12 LAB — MYOCARDIAL PERFUSION IMAGING
LV dias vol: 81 mL (ref 62–150)
LV sys vol: 36 mL
Peak HR: 129 {beats}/min
Rest HR: 57 {beats}/min
SDS: 0
SRS: 5
SSS: 5
TID: 1.02

## 2019-06-12 MED ORDER — TECHNETIUM TC 99M TETROFOSMIN IV KIT
10.7000 | PACK | Freq: Once | INTRAVENOUS | Status: AC | PRN
  Administered 2019-06-12: 10.7 via INTRAVENOUS
  Filled 2019-06-12: qty 11

## 2019-06-12 MED ORDER — TECHNETIUM TC 99M TETROFOSMIN IV KIT
30.1000 | PACK | Freq: Once | INTRAVENOUS | Status: AC | PRN
  Administered 2019-06-12: 30.1 via INTRAVENOUS
  Filled 2019-06-12: qty 31

## 2019-06-12 MED ORDER — SODIUM CHLORIDE 0.9 % IV SOLN
10.0000 ug/kg/min | INTRAVENOUS | Status: DC
  Administered 2019-06-12: 10 ug/kg/min via INTRAVENOUS

## 2019-06-12 NOTE — Progress Notes (Signed)
No signs of poor circulation to heart on the stress test.

## 2019-06-21 NOTE — Addendum Note (Signed)
Addended by: Gayland Curry on: 06/21/2019 05:20 PM   Modules accepted: Orders

## 2019-07-02 ENCOUNTER — Ambulatory Visit (INDEPENDENT_AMBULATORY_CARE_PROVIDER_SITE_OTHER): Payer: Medicare HMO | Admitting: Internal Medicine

## 2019-07-02 ENCOUNTER — Other Ambulatory Visit: Payer: Self-pay

## 2019-07-02 ENCOUNTER — Encounter (INDEPENDENT_AMBULATORY_CARE_PROVIDER_SITE_OTHER): Payer: Self-pay

## 2019-07-02 ENCOUNTER — Telehealth: Payer: Self-pay | Admitting: Internal Medicine

## 2019-07-02 ENCOUNTER — Encounter: Payer: Self-pay | Admitting: Internal Medicine

## 2019-07-02 VITALS — BP 135/80 | HR 77 | Temp 96.8°F | Ht 67.0 in | Wt 139.0 lb

## 2019-07-02 DIAGNOSIS — R0609 Other forms of dyspnea: Secondary | ICD-10-CM

## 2019-07-02 DIAGNOSIS — E785 Hyperlipidemia, unspecified: Secondary | ICD-10-CM

## 2019-07-02 DIAGNOSIS — R931 Abnormal findings on diagnostic imaging of heart and coronary circulation: Secondary | ICD-10-CM

## 2019-07-02 DIAGNOSIS — R0600 Dyspnea, unspecified: Secondary | ICD-10-CM

## 2019-07-02 MED ORDER — EVOLOCUMAB 140 MG/ML ~~LOC~~ SOAJ: 1.0000 | SUBCUTANEOUS | 11 refills | Status: DC

## 2019-07-02 NOTE — Progress Notes (Signed)
LIPID CLINIC CONSULT NOTE  Chief Complaint:  Manage dyslipidemia  Primary Care Physician: Gayland Curry, DO  Primary Cardiologist:  No primary care provider on file.  HPI:  BLU HEWATT is a 79 y.o. male who is being seen today for the evaluation of dyslipidemia at the request of Reed, [place], DO.  This is a pleasant 79 year old male with a history of tobacco abuse, prior pack per day smoker for 40 to 50 years, with history of COPD not on inhalers, who recently had progressive symptoms of dyspnea on exertion and underwent nuclear stress testing in our office several days ago which was negative for ischemia.  He was referred for lipid management due to persistent dyslipidemia.  He reports has been off of statins for about 3 years due to significant myalgias.  In the past he has tried Lipitor, Zocor and Pravachol, all of which caused side effects.  His total cholesterol was 269, HDL 50, triglycerides 135 and LDL 192.  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  In addition he had a CT scan of the chest last year which showed multivessel coronary calcium and aortic atherosclerosis.  Based on these findings he needs significant, in fact greater than 50% reduction in LDL cholesterol due to recent consensus guidelines in 2018.  Since he is unable to tolerate high potency statins, he would not likely reach these targets without a PCSK9 inhibitor.  PMHx:  Past Medical History:  Diagnosis Date  . Allergic rhinitis, cause unspecified 09/08/2006  . Carpal tunnel syndrome 03/16/2009  . Chest pain, unspecified 08/20/2012  . Chest pain, unspecified 12/26/2011  . Concussion, unspecified 10/30/1994  . COPD (chronic obstructive pulmonary disease) ([facility]) 02/24/2006  . Corns and callosities 04/11/2007  . Dermatophytosis of the body   . Disturbance of skin sensation   . Diverticulosis of colon (without mention of hemorrhage) 06/14/2010  . Edema of male genital organs 08/20/2012  .  Elevated blood pressure reading without diagnosis of hypertension 06/14/2010  . Encounter for long-term (current) use of other medications   . Hypercholesterolemia   . Hypertrophy of prostate without urinary obstruction and other lower urinary tract symptoms (LUTS)   . 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 Lumbago 05/11/2009  . Nocturia 08/20/2012  . Other and unspecified hyperlipidemia 02/24/2006  . Pain in joint, ankle and foot 04/11/2007  . Pterygium, unspecified 03/01/2006  . Rash and other nonspecific skin eruption 12/28/1999  . Seborrheic dermatitis, unspecified 03/21/2001  . 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 Tobacco use disorder   . Ulcer of other part of foot 03/16/2007  . Unspecified constipation 05/11/2009  . Urinary tract infection, site not specified 05/11/2009  . Ventral hernia, unspecified, without mention of obstruction or gangrene     Past Surgical History:  Procedure Laterality Date  . ELBOW SURGERY Right   . EYE SURGERY     Cataract Removal  . FRACTURE SURGERY  1986   right hip, right elbow  . KNEE SURGERY Right 01/2007   knee cap injury  . SKIN CANCER EXCISION  07/2014   Back and head   . TONSILLECTOMY  1948    FAMHx:  Family History  Problem Relation Age of Onset  . Diabetes Mother   . Hypertension Mother   . Cancer Father     SOCHx:   reports that he quit smoking about 9 years ago. He has never used smokeless tobacco. He reports that  he does not drink alcohol or use drugs.  ALLERGIES:  Allergies  Allergen Reactions  . Zocor [Simvastatin] Shortness Of Breath    Sob and itching  . Ace Inhibitors Cough    Tolerating ARB instead  . Lipitor [Atorvastatin]     Muscle and bone pain  . Pravachol [Pravastatin Sodium] Other (See Comments)    myalgias    ROS: Pertinent items noted in HPI and remainder of comprehensive ROS otherwise negative.  HOME MEDS: Current Outpatient Medications on File  Prior to Visit  Medication Sig Dispense Refill  . aspirin 81 MG tablet Take 81 mg by mouth daily.     . Evolocumab 140 MG/ML SOAJ Inject 1 Dose into the skin every 14 (fourteen) days.    Marland Kitchen ibuprofen (ADVIL,MOTRIN) 200 MG tablet Take 400 mg by mouth every 4 (four) hours as needed for fever, headache, mild pain, moderate pain or cramping.    Marland Kitchen omeprazole (PRILOSEC) 20 MG capsule TAKE 1 CAPSULE (20 MG TOTAL) BY MOUTH TWO TIMES DAILY BEFORE A MEAL. 180 capsule 1  . tamsulosin (FLOMAX) 0.4 MG CAPS capsule TAKE 1 CAPSULE EVERY DAY 90 capsule 3  . valsartan (DIOVAN) 80 MG tablet TAKE 1 TABLET EVERY DAY 90 tablet 3   No current facility-administered medications on file prior to visit.    LABS/IMAGING: No results found for this or any previous visit (from the past 48 hour(s)). No results found.  LIPID PANEL:    Component Value Date/Time   CHOL 269 (H) 05/23/2019 0832   CHOL 252 (H) 04/01/2015 0851   TRIG 135 05/23/2019 0832   HDL 50 05/23/2019 0832   HDL 58 04/01/2015 0851   CHOLHDL 5.4 (H) 05/23/2019 0832   VLDL 16 11/01/2016 0807   LDLCALC 192 (H) 05/23/2019 0832    WEIGHTS: Wt Readings from Last 3 Encounters:  07/02/19 139 lb (63 kg)  06/12/19 138 lb (62.6 kg)  05/27/19 138 lb (62.6 kg)    VITALS: BP 135/80 (BP Location: Left Arm, Patient Position: Sitting, Cuff Size: Normal)   Pulse 77   Temp (!) 96.8 F (36 C)   Ht 5\' 7"  (1.702 m)   Wt 139 lb (63 kg)   SpO2 100%   BMI 21.77 kg/m   EXAM: Deferred  EKG: Deferred  ASSESSMENT: 1. Multivessel coronary artery calcification 2. Longstanding tobacco abuse with probable COPD, on inhalers 3. Recent progressive dyspnea on exertion-negative Myoview stress test (06/2019) 4. Essential hypertension 5. Statin myalgias  PLAN: 1.   Mr. Knotts has multivessel coronary calcification and fortunately had a negative Myoview stress test although has had progressive dyspnea on exertion.  This could be due to worsening COPD and he may  need repeat pulmonary function testing as well as a trial of inhalers or referral to pulmonary.  There is a small possibility of a false negative Myoview stress test in the setting of balanced ischemia with multivessel coronary calcification therefore he should be monitored closely for worsening symptoms and considered for possible advanced coronary testing.  With regards to his dyslipidemia, I agree he needs further reduction in LDL cholesterol, this typically greater than 50% reduction based on 2018 consensus guidelines.  He has had statin myalgias and has been intolerant to multiple high and moderate potency statins.  He is a good candidate for PCSK9 inhibitor.  We discussed options and I would recommend Repatha every 2 weeks.  We will reach out to insurance company for prior authorization.  Plan to repeat lipid in about 3  to 4 months and follow-up at that time.  Thanks again for the kind referral.  Pixie Casino, MD, FACC, [place] Director of the [facility] Lip[facility]  Card[facility]ovascular [facility] Diplomate of the American Board of Clinical Lipidology Attending Cardiologist  Direct Dial: [phone]  Fax: [phone]  Website:  [email or web address] 07/02/2019, 9:28 AM

## 2019-07-02 NOTE — Telephone Encounter (Signed)
-----   Message from Fidel Levy, RN sent at 07/02/2019  9:24 AM EST ----- Regarding: repatha prior [facility]ag[place] ID: [id number] [place]: [id number] PCN: [id number] RxGrp: [id number]

## 2019-07-02 NOTE — Telephone Encounter (Signed)
PA for repatha submitted via CMM PA Case: [id number], Status: Approved, Coverage Starts on: 07/02/2019 12:00:00 AM, Coverage Ends on: 12/29/2019 12:00:00 AM

## 2019-07-02 NOTE — Telephone Encounter (Signed)
Patient aware medication has been approved Rx sent to [facility] He was advised to apply for healthwell grant if he needs co-pay assistance

## 2019-07-02 NOTE — Patient Instructions (Signed)
Medication Instructions:  Dr. Debara Pickett recommends Repatha 140mg /mL (PCSK9). This is an injectable cholesterol medication self-administered once every 14 days. This medication will need prior approval with your insurance company, which we will work on. If the medication is not approved initially, we may need to do an appeal with your insurance. We will keep you updated on this process.   If you need co-pay assistance, please look into the program at healthwellfoundation.[email or web address] >> disease funds >> hypercholesterolemia. This is an online application or you can call to completed.     *If you need a refill on your cardiac medications before your next appointment, please call your pharmacy*  Lab Work: FASTING lab work in 3-4 months (before next lipid clinic visit with Dr. Debara Pickett) You can go to any [facility] test site convenient for you - Dr. Lysbeth Penner office has a lab open M-F from 8-4:30  If you have labs (blood work) drawn today and your tests are completely normal, you will receive your results only by: Marland Kitchen MyChart Message (if you have MyChart) OR . A paper copy in the mail If you have any lab test that is abnormal or we need to change your treatment, we will call you to review the results.  Testing/Procedures: NONE  Follow-Up: At [facility], you and your health needs are our priority.  As part of our continuing mission to provide you with exceptional heart care, we have created designated Provider Care Teams.  These Care Teams include your primary Cardiologist (physician) and Advanced Practice Providers (APPs -  Physician Assistants and Nurse Practitioners) who all work together to provide you with the care you need, when you need it.  Your next appointment:   3-4 month(s) - lipid clinic  The format for your next appointment:   Either In Person or Virtual  Provider:   K. Mali Hilty, MD  Other Instructions

## 2019-07-12 ENCOUNTER — Ambulatory Visit: Payer: Medicare HMO | Admitting: Internal Medicine

## 2019-07-29 ENCOUNTER — Other Ambulatory Visit: Payer: Self-pay | Admitting: Internal Medicine

## 2019-09-23 ENCOUNTER — Other Ambulatory Visit: Payer: Medicare HMO

## 2019-09-26 ENCOUNTER — Ambulatory Visit: Payer: Medicare HMO | Admitting: Internal Medicine

## 2019-10-08 ENCOUNTER — Ambulatory Visit (INDEPENDENT_AMBULATORY_CARE_PROVIDER_SITE_OTHER): Payer: Medicare HMO | Admitting: Family

## 2019-10-08 ENCOUNTER — Other Ambulatory Visit: Payer: Self-pay

## 2019-10-08 ENCOUNTER — Telehealth: Payer: Self-pay

## 2019-10-08 ENCOUNTER — Encounter: Payer: Self-pay | Admitting: Family

## 2019-10-08 ENCOUNTER — Encounter: Payer: Medicare HMO | Admitting: Family

## 2019-10-08 DIAGNOSIS — Z0000 Encounter for general adult medical examination without abnormal findings: Secondary | ICD-10-CM | POA: Diagnosis not present

## 2019-10-08 NOTE — Progress Notes (Signed)
    This service is provided via telemedicine  No vital signs collected/recorded due to the encounter was a telemedicine visit.   Location of patient (ex: home, work): Home.  Patient consents to a telephone visit: Yes.  Location of the provider (ex: office, home):  [facility].  Name of any referring provider: N/A  Names of all persons participating in the telemedicine service and their role in the encounter:  Patient, Thomas Reyes, RMA, Ngetich, [name], NP.    Time spent on call: 8 minutes spent on the phone with Medical Assistant.   

## 2019-10-08 NOTE — Patient Instructions (Signed)
Thomas Reyes , Thank you for taking time to come for your Medicare Wellness Visit. I appreciate your ongoing commitment to your health goals. Please review the following plan we discussed and let me know if I can assist you in the future.   Screening recommendations/referrals: Colonoscopy: Aged out  Recommended yearly ophthalmology/optometry visit for glaucoma screening and checkup Recommended yearly dental visit for hygiene and checkup  Vaccinations: Influenza vaccine: Up to date  Pneumococcal vaccine : Up to date  Tdap vaccine : Up to date due 12/04/2023  Shingles vaccine: declined due to cost     Advanced directives: Yes  Conditions/risks identified: Advance age male > 39 yrs,Male Gender,Hypertension,dyslipidemia, Hx of smoking   Next appointment: 1 year   Preventive Care 80 Years and Older, Male Preventive care refers to lifestyle choices and visits with your health care provider that can promote health and wellness. What does preventive care include?  A yearly physical exam. This is also called an annual well check.  Dental exams once or twice a year.  Routine eye exams. Ask your health care provider how often you should have your eyes checked.  Personal lifestyle choices, including:  Daily care of your teeth and gums.  Regular physical activity.  Eating a healthy diet.  Avoiding tobacco and drug use.  Limiting alcohol use.  Practicing safe sex.  Taking low doses of aspirin every day.  Taking vitamin and mineral supplements as recommended by your health care provider. What happens during an annual well check? The services and screenings done by your health care provider during your annual well check will depend on your age, overall health, lifestyle risk factors, and family history of disease. Counseling  Your health care provider may ask you questions about your:  Alcohol use.  Tobacco use.  Drug use.  Emotional well-being.  Home and relationship  well-being.  Sexual activity.  Eating habits.  History of falls.  Memory and ability to understand (cognition).  Work and work Statistician. Screening  You may have the following tests or measurements:  Height, weight, and BMI.  Blood pressure.  Lipid and cholesterol levels. These may be checked every 5 years, or more frequently if you are over 25 years old.  Skin check.  Lung cancer screening. You may have this screening every year starting at age 45 if you have a 30-pack-year history of smoking and currently smoke or have quit within the past 15 years.  Fecal occult blood test (FOBT) of the stool. You may have this test every year starting at age 81.  Flexible sigmoidoscopy or colonoscopy. You may have a sigmoidoscopy every 5 years or a colonoscopy every 10 years starting at age 80.  Prostate cancer screening. Recommendations will vary depending on your family history and other risks.  Hepatitis C blood test.  Hepatitis B blood test.  Sexually transmitted disease (STD) testing.  Diabetes screening. This is done by checking your blood sugar (glucose) after you have not eaten for a while (fasting). You may have this done every 1-3 years.  Abdominal aortic aneurysm (AAA) screening. You may need this if you are a current or former smoker.  Osteoporosis. You may be screened starting at age 92 if you are at high risk. Talk with your health care provider about your test results, treatment options, and if necessary, the need for more tests. Vaccines  Your health care provider may recommend certain vaccines, such as:  Influenza vaccine. This is recommended every year.  Tetanus, diphtheria, and acellular  pertussis (Tdap, Td) vaccine. You may need a Td booster every 10 years.  Zoster vaccine. You may need this after age 45.  Pneumococcal 13-valent conjugate (PCV13) vaccine. One dose is recommended after age 23.  Pneumococcal polysaccharide (PPSV23) vaccine. One dose is  recommended after age 37. Talk to your health care provider about which screenings and vaccines you need and how often you need them. This information is not intended to replace advice given to you by your health care provider. Make sure you discuss any questions you have with your health care provider. Document Released: 06/19/2015 Document Revised: 02/10/2016 Document Reviewed: 03/24/2015 Elsevier Interactive Patient Education  2017 [place] Prevention in the Home Falls can cause injuries. They can happen to people of all ages. There are many things you can do to make your home safe and to help prevent falls. What can I do on the outside of my home?  Regularly fix the edges of walkways and driveways and fix any cracks.  Remove anything that might make you trip as you walk through a door, such as a raised step or threshold.  Trim any bushes or trees on the path to your home.  Use bright outdoor lighting.  Clear any walking paths of anything that might make someone trip, such as rocks or tools.  Regularly check to see if handrails are loose or broken. Make sure that both sides of any steps have handrails.  Any raised decks and porches should have guardrails on the edges.  Have any leaves, snow, or ice cleared regularly.  Use sand or salt on walking paths during winter.  Clean up any spills in your garage right away. This includes oil or grease spills. What can I do in the bathroom?  Use night lights.  Install grab bars by the toilet and in the tub and shower. Do not use towel bars as grab bars.  Use non-skid mats or decals in the tub or shower.  If you need to sit down in the shower, use a plastic, non-slip stool.  Keep the floor dry. Clean up any water that spills on the floor as soon as it happens.  Remove soap buildup in the tub or shower regularly.  Attach bath mats securely with double-sided non-slip rug tape.  Do not have throw rugs and other things on  the floor that can make you trip. What can I do in the bedroom?  Use night lights.  Make sure that you have a light by your bed that is easy to reach.  Do not use any sheets or blankets that are too big for your bed. They should not hang down onto the floor.  Have a firm chair that has side arms. You can use this for support while you get dressed.  Do not have throw rugs and other things on the floor that can make you trip. What can I do in the kitchen?  Clean up any spills right away.  Avoid walking on wet floors.  Keep items that you use a lot in easy-to-reach places.  If you need to reach something above you, use a strong step stool that has a grab bar.  Keep electrical cords out of the way.  Do not use floor polish or wax that makes floors slippery. If you must use wax, use non-skid floor wax.  Do not have throw rugs and other things on the floor that can make you trip. What can I do with my stairs?  Do not leave any items on the stairs.  Make sure that there are handrails on both sides of the stairs and use them. Fix handrails that are broken or loose. Make sure that handrails are as long as the stairways.  Check any carpeting to make sure that it is firmly attached to the stairs. Fix any carpet that is loose or worn.  Avoid having throw rugs at the top or bottom of the stairs. If you do have throw rugs, attach them to the floor with carpet tape.  Make sure that you have a light switch at the top of the stairs and the bottom of the stairs. If you do not have them, ask someone to add them for you. What else can I do to help prevent falls?  Wear shoes that:  Do not have high heels.  Have rubber bottoms.  Are comfortable and fit you well.  Are closed at the toe. Do not wear sandals.  If you use a stepladder:  Make sure that it is fully opened. Do not climb a closed stepladder.  Make sure that both sides of the stepladder are locked into place.  Ask someone to  hold it for you, if possible.  Clearly mark and make sure that you can see:  Any grab bars or handrails.  First and last steps.  Where the edge of each step is.  Use tools that help you move around (mobility aids) if they are needed. These include:  Canes.  Walkers.  Scooters.  Crutches.  Turn on the lights when you go into a dark area. Replace any light bulbs as soon as they burn out.  Set up your furniture so you have a clear path. Avoid moving your furniture around.  If any of your floors are uneven, fix them.  If there are any pets around you, be aware of where they are.  Review your medicines with your doctor. Some medicines can make you feel dizzy. This can increase your chance of falling. Ask your doctor what other things that you can do to help prevent falls. This information is not intended to replace advice given to you by your health care provider. Make sure you discuss any questions you have with your health care provider. Document Released: 03/19/2009 Document Revised: 10/29/2015 Document Reviewed: 06/27/2014 Elsevier Interactive Patient Education  2017 [employer].

## 2019-10-08 NOTE — Telephone Encounter (Signed)
Called patient to start visit and wife 'Natalie Corr" answered the phone and states he's not home. Mrs.Creasman states that he will be home around 11am. I will reschedule patient and call back during requested time. This is the first attempt to being visit.

## 2019-10-08 NOTE — Progress Notes (Signed)
Subjective:   Thomas Reyes is a 79 y.o. male who presents for Medicare Annual/Subsequent preventive examination.  Review of Systems:  Cardiac Risk Factors include: advanced age (>74men, >46 women);male gender;hypertension;smoking/ tobacco exposure;dyslipidemia     Objective:    Vitals: There were no vitals taken for this visit.  There is no height or weight on file to calculate BMI.  Advanced Directives 10/08/2019 10/05/2018 11/13/2017 09/04/2017 07/10/2017 03/09/2017 11/03/2016  Does Patient Have a Medical Advance Directive? Yes Yes Yes Yes Yes Yes Yes  Type of Advance Directive Living will Living will Living will Living will Living will Living will Living will  Does patient want to make changes to medical advance directive? No - Patient declined No - Patient declined No - Patient declined No - Patient declined No - Patient declined - -  Copy of [place] in [place]  Would patient like information on creating a medical advance directive? - No - Patient declined - - - - -    Tobacco Social History   Tobacco Use  Smoking Status Former Smoker  . Quit date: 06/09/2010  . Years since quitting: 9.3  Smokeless Tobacco Never Used     Counseling given: Not Answered   Clinical Intake:  Pre-visit preparation completed: No  Pain : No/denies pain     BMI - recorded: 21.61 Nutritional Status: BMI of 19-24  Normal Nutritional Risks: None Diabetes: No  How often do you need to have someone help you when you read instructions, pamphlets, or other written materials from your doctor or pharmacy?: 1 - Never What is the last grade level you completed in school?: 12 Grade  Interpreter Needed?: No  Information entered by :: Carlisle Enke FNP-C  Past Medical History:  Diagnosis Date  . Allergic rhinitis, cause unspecified 09/08/2006  . Carpal tunnel syndrome 03/16/2009  . Chest pain, unspecified 08/20/2012  . Chest pain, unspecified 12/26/2011  .  Concussion, unspecified 10/30/1994  . COPD (chronic obstructive pulmonary disease) ([place]) 02/24/2006  . Corns and callosities 04/11/2007  . Dermatophytosis of the body   . Disturbance of skin sensation   . Diverticulosis of colon (without mention of hemorrhage) 06/14/2010  . Edema of male genital organs 08/20/2012  . Elevated blood pressure reading without diagnosis of hypertension 06/14/2010  . Encounter for long-term (current) use of other medications   . Hypercholesterolemia   . Hypertrophy of prostate without urinary obstruction and other lower urinary tract symptoms (LUTS)   . 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 Lumbago 05/11/2009  . Nocturia 08/20/2012  . Other and unspecified hyperlipidemia 02/24/2006  . Pain in joint, ankle and foot 04/11/2007  . Pterygium, unspecified 03/01/2006  . Rash and other nonspecific skin eruption 12/28/1999  . Seborrheic dermatitis, unspecified 03/21/2001  . 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 Tobacco use disorder   . Ulcer of other part of foot 03/16/2007  . Unspecified constipation 05/11/2009  . Urinary tract infection, site not specified 05/11/2009  . Ventral hernia, unspecified, without mention of obstruction or gangrene    Past Surgical History:  Procedure Laterality Date  . ELBOW SURGERY Right   . EYE SURGERY     Cataract Removal  . FRACTURE SURGERY  1986   right hip, right elbow  . KNEE SURGERY Right 01/2007   knee cap injury  . SKIN CANCER EXCISION  07/2014   Back and head   .  TONSILLECTOMY  1948   Family History  Problem Relation Age of Onset  . Diabetes Mother   . Hypertension Mother   . Cancer Father    Social History   Socioeconomic History  . Marital status: Married    Spouse name: Not on file  . Number of children: Not on file  . Years of education: Not on file  . Highest education level: Not on file  Occupational History  . Not on file  Tobacco Use  . Smoking  status: Former Smoker    Quit date: 06/09/2010    Years since quitting: 9.3  . Smokeless tobacco: Never Used  Substance and Sexual Activity  . Alcohol use: No    Alcohol/week: 0.0 standard drinks  . Drug use: No  . Sexual activity: Not Currently  Other Topics Concern  . Not on file  Social History Narrative  . Not on file   Social Determinants of Health   Financial Resource Strain:   . Difficulty of Paying Living Expenses:   Food Insecurity:   . Worried About Charity fundraiser in the Last Year:   . Arboriculturist in the Last Year:   Transportation Needs:   . Film/video editor (Medical):   Marland Kitchen Lack of Transportation (Non-Medical):   Physical Activity:   . Days of Exercise per Week:   . Minutes of Exercise per Session:   Stress:   . Feeling of Stress :   Social Connections:   . Frequency of Communication with Friends and Family:   . Frequency of Social Gatherings with Friends and Family:   . Attends Religious Services:   . Active Member of Clubs or Organizations:   . Attends Archivist Meetings:   Marland Kitchen Marital Status:     Outpatient Encounter Medications as of 10/08/2019  Medication Sig  . aspirin 81 MG tablet Take 81 mg by mouth daily.   . Evolocumab 140 MG/ML SOAJ Inject 1 Dose into the skin every 14 (fourteen) days.  Marland Kitchen ibuprofen (ADVIL,MOTRIN) 200 MG tablet Take 400 mg by mouth every 4 (four) hours as needed for fever, headache, mild pain, moderate pain or cramping.  Marland Kitchen omeprazole (PRILOSEC) 20 MG capsule TAKE 1 CAPSULE (20 MG TOTAL) BY MOUTH TWO TIMES DAILY BEFORE A MEAL.  . tamsulosin (FLOMAX) 0.4 MG CAPS capsule TAKE 1 CAPSULE EVERY DAY  . valsartan (DIOVAN) 80 MG tablet TAKE 1 TABLET EVERY DAY   No facility-administered encounter medications on file as of 10/08/2019.    Activities of Daily Living In your present state of health, do you have any difficulty performing the following activities: 10/08/2019  Hearing? Y  Comment wears hearing aids  Vision? N    Difficulty concentrating or making decisions? N  Walking or climbing stairs? N  Dressing or bathing? N  Doing errands, shopping? N  Preparing Food and eating ? N  Using the Toilet? N  In the past six months, have you accidently leaked urine? N  Do you have problems with loss of bowel control? N  Managing your Medications? N  Managing your Finances? N  Housekeeping or managing your Housekeeping? N  Some recent data might be hidden    Patient Care Team: Gayland Curry, DO as PCP - General (Geriatric Medicine) Warden Fillers, MD as Consulting Physician (Ophthalmology)   Assessment:   This is a routine wellness examination for Brandt.  Exercise Activities and Dietary recommendations Current Exercise Habits: The patient has a physically strenuous job,  but has no regular exercise apart from work., Exercise limited by: None identified  Goals    . <enter goal here>     Starting 06/27/16, I will maintain my current lifestyle.        Fall Risk Fall Risk  10/08/2019 05/27/2019 11/19/2018 10/05/2018 05/21/2018  Falls in the past year? 0 0 0 0 0  Number falls in past yr: 0 0 0 0 0  Injury with Fall? 0 0 0 0 0   Is the patient's home free of loose throw rugs in walkways, pet beds, electrical cords, etc?   yes      Grab bars in the bathroom? no      Handrails on the stairs?   no      Adequate lighting?   yes  Depression Screen PHQ 2/9 Scores 10/08/2019 05/27/2019 11/19/2018 10/05/2018  PHQ - 2 Score 0 0 0 0    Cognitive Function MMSE - Mini Mental State Exam 09/04/2017 06/27/2016 04/03/2015  Orientation to time 5 5 4   Orientation to Place 5 5 5   Registration 3 3 3   Attention/ Calculation 4 4 5   Recall 1 1 2   Language- name 2 objects 2 2 2   Language- repeat 1 1 1   Language- follow 3 step command 3 3 3   Language- read & follow direction 1 1 1   Write a sentence 1 1 1   Copy design 1 0 1  Total score 27 26 28      6CIT Screen 10/08/2019 10/05/2018  What Year? 0 points 0 points  What  month? 0 points 0 points  What time? 0 points 0 points  Count back from 20 0 points 0 points  Months in reverse 0 points 0 points  Repeat phrase 4 points 0 points  Total Score 4 0    Immunization History  Administered Date(s) Administered  . Fluad Quad(high Dose 65+) 05/27/2019  . Influenza, High Dose Seasonal PF 03/09/2017, 05/21/2018  . Influenza,inj,Quad PF,6+ Mos 03/31/2014, 04/03/2015, 02/25/2016  . Pneumococcal Conjugate-13 07/18/2013  . Pneumococcal Polysaccharide-23 10/02/2014  . Tdap 06/10/2005, 12/03/2013    Qualifies for Shingles Vaccine? Too expensive   Screening Tests Health Maintenance  Topic Date Due  . COVID-19 Vaccine (1) Never done  . INFLUENZA VACCINE  01/05/2020  . TETANUS/TDAP  12/04/2023  . PNA vac Low Risk Adult  Completed   Cancer Screenings: Lung: Low Dose CT Chest recommended if Age 70-80 years, 30 pack-year currently smoking OR have quit w/in 15years. Patient does not qualify. Colorectal: Age out   Additional Screenings:  Hepatitis C Screening: low       Plan:   - Hepatitis C screening  - Declined Shingrix vaccine due to cost  - Decline COVID-19 vaccine still thinking about it.  I have personally reviewed and noted the following in the patient's chart:   . Medical and social history . Use of alcohol, tobacco or illicit drugs  . Current medications and supplements . Functional ability and status . Nutritional status . Physical activity . Advanced directives . List of other physicians . Hospitalizations, surgeries, and ER visits in previous 12 months . Vitals . Screenings to include cognitive, depression, and falls . Referrals and appointments  In addition, I have reviewed and discussed with patient certain preventive protocols, quality metrics, and best practice recommendations. A written personalized care plan for preventive services as well as general preventive health recommendations were provided to patient.    Sandrea Hughs,  NP  10/08/2019

## 2019-10-09 NOTE — Progress Notes (Signed)
This encounter was created in error - please disregard.

## 2019-10-10 ENCOUNTER — Other Ambulatory Visit: Payer: Self-pay

## 2019-10-10 ENCOUNTER — Other Ambulatory Visit: Payer: Medicare HMO

## 2019-10-10 DIAGNOSIS — R739 Hyperglycemia, unspecified: Secondary | ICD-10-CM | POA: Diagnosis not present

## 2019-10-10 DIAGNOSIS — J449 Chronic obstructive pulmonary disease, unspecified: Secondary | ICD-10-CM | POA: Diagnosis not present

## 2019-10-10 DIAGNOSIS — R55 Syncope and collapse: Secondary | ICD-10-CM | POA: Diagnosis not present

## 2019-10-10 DIAGNOSIS — N138 Other obstructive and reflux uropathy: Secondary | ICD-10-CM | POA: Diagnosis not present

## 2019-10-10 DIAGNOSIS — R0600 Dyspnea, unspecified: Secondary | ICD-10-CM | POA: Diagnosis not present

## 2019-10-10 DIAGNOSIS — N401 Enlarged prostate with lower urinary tract symptoms: Secondary | ICD-10-CM | POA: Diagnosis not present

## 2019-10-10 DIAGNOSIS — E7800 Pure hypercholesterolemia, unspecified: Secondary | ICD-10-CM | POA: Diagnosis not present

## 2019-10-10 DIAGNOSIS — H3530 Unspecified macular degeneration: Secondary | ICD-10-CM | POA: Diagnosis not present

## 2019-10-11 LAB — COMPLETE METABOLIC PANEL WITH GFR
AG Ratio: 1.5 (calc) (ref 1.0–2.5)
ALT: 8 U/L — ABNORMAL LOW (ref 9–46)
AST: 18 U/L (ref 10–35)
Albumin: 4.1 g/dL (ref 3.6–5.1)
Alkaline phosphatase (APISO): 62 U/L (ref 35–144)
BUN/Creatinine Ratio: 10 (calc) (ref 6–22)
BUN: 14 mg/dL (ref 7–25)
CO2: 27 mmol/L (ref 20–32)
Calcium: 9.4 mg/dL (ref 8.6–10.3)
Chloride: 102 mmol/L (ref 98–110)
Creat: 1.47 mg/dL — ABNORMAL HIGH (ref 0.70–1.18)
GFR, Est African American: 52 mL/min/{1.73_m2} — ABNORMAL LOW (ref >=60)
GFR, Est Non African American: 45 mL/min/{1.73_m2} — ABNORMAL LOW (ref >=60)
Globulin: 2.7 g/dL (calc) (ref 1.9–3.7)
Glucose, Bld: 96 mg/dL (ref 65–99)
Potassium: 4.6 mmol/L (ref 3.5–5.3)
Sodium: 135 mmol/L (ref 135–146)
Total Bilirubin: 0.6 mg/dL (ref 0.2–1.2)
Total Protein: 6.8 g/dL (ref 6.1–8.1)

## 2019-10-11 LAB — CBC WITH DIFFERENTIAL/PLATELET
Absolute Monocytes: 378 cells/uL (ref 200–950)
Basophils Absolute: 18 cells/uL (ref 0–200)
Basophils Relative: 0.4 %
Eosinophils Absolute: 140 cells/uL (ref 15–500)
Eosinophils Relative: 3.1 %
HCT: 41 % (ref 38.5–50.0)
Hemoglobin: 13.1 g/dL — ABNORMAL LOW (ref 13.2–17.1)
Lymphs Abs: 1328 cells/uL (ref 850–3900)
MCH: 27.2 pg (ref 27.0–33.0)
MCHC: 32 g/dL (ref 32.0–36.0)
MCV: 85.1 fL (ref 80.0–100.0)
MPV: 10.2 fL (ref 7.5–12.5)
Monocytes Relative: 8.4 %
Neutro Abs: 2637 cells/uL (ref 1500–7800)
Neutrophils Relative %: 58.6 %
Platelets: 220 10*3/uL (ref 140–400)
RBC: 4.82 10*6/uL (ref 4.20–5.80)
RDW: 14.1 % (ref 11.0–15.0)
Total Lymphocyte: 29.5 %
WBC: 4.5 10*3/uL (ref 3.8–10.8)

## 2019-10-11 LAB — HEMOGLOBIN A1C
Hgb A1c MFr Bld: 5.8 % of total Hgb — ABNORMAL HIGH (ref <5.7)
Mean Plasma Glucose: 120 (calc)
eAG (mmol/L): 6.6 (calc)

## 2019-10-11 LAB — LIPID PANEL
Cholesterol: 192 mg/dL (ref <200)
HDL: 56 mg/dL (ref >=40)
LDL Cholesterol (Calc): 117 mg/dL (calc) — ABNORMAL HIGH
Non-HDL Cholesterol (Calc): 136 mg/dL (calc) — ABNORMAL HIGH (ref <130)
Total CHOL/HDL Ratio: 3.4 (calc) (ref <5.0)
Triglycerides: 86 mg/dL (ref <150)

## 2019-10-11 NOTE — Progress Notes (Signed)
Cholesterol and sugar have improved.  Cholesterol is a lot better with the evolocumab.  Still not at goal, but not at such a dangerous level now which is great.  Kidneys are about the same (slightly abnormal), and he still has mild anemia.  We will review at his appt

## 2019-10-14 ENCOUNTER — Other Ambulatory Visit: Payer: Self-pay

## 2019-10-14 ENCOUNTER — Telehealth: Payer: Self-pay | Admitting: Internal Medicine

## 2019-10-14 ENCOUNTER — Encounter: Payer: Self-pay | Admitting: Internal Medicine

## 2019-10-14 ENCOUNTER — Ambulatory Visit (INDEPENDENT_AMBULATORY_CARE_PROVIDER_SITE_OTHER): Payer: Medicare HMO | Admitting: Internal Medicine

## 2019-10-14 ENCOUNTER — Other Ambulatory Visit: Payer: Self-pay | Admitting: Internal Medicine

## 2019-10-14 VITALS — BP 142/82 | HR 60 | Temp 97.8°F | Ht 67.0 in | Wt 140.0 lb

## 2019-10-14 DIAGNOSIS — J449 Chronic obstructive pulmonary disease, unspecified: Secondary | ICD-10-CM

## 2019-10-14 DIAGNOSIS — D229 Melanocytic nevi, unspecified: Secondary | ICD-10-CM

## 2019-10-14 DIAGNOSIS — R739 Hyperglycemia, unspecified: Secondary | ICD-10-CM | POA: Diagnosis not present

## 2019-10-14 DIAGNOSIS — N138 Other obstructive and reflux uropathy: Secondary | ICD-10-CM

## 2019-10-14 DIAGNOSIS — N401 Enlarged prostate with lower urinary tract symptoms: Secondary | ICD-10-CM | POA: Diagnosis not present

## 2019-10-14 DIAGNOSIS — E7800 Pure hypercholesterolemia, unspecified: Secondary | ICD-10-CM | POA: Diagnosis not present

## 2019-10-14 DIAGNOSIS — Z8711 Personal history of peptic ulcer disease: Secondary | ICD-10-CM

## 2019-10-14 NOTE — Telephone Encounter (Signed)
Spoke with pt wife, aware it looks like this is a follow up to the DOE the patient had been having and she reports she does not feel like that has improved. Also this will be a follow up top his lipid panel resulted on 10-08-19. Advised to keep the appointment if the patient is continuing to have symptoms with no improvement or change since last being seen as further testing maybe needed. Patient wife agreed.

## 2019-10-14 NOTE — Patient Instructions (Signed)
Please go get your covid vaccine.

## 2019-10-14 NOTE — Telephone Encounter (Signed)
Patient's wife wants to know if the appt for 10/16/19 with Dr. Debara Pickett is necessary. She states he had his wellness appt with Dr. Mariea Clonts today. Please advise.

## 2019-10-14 NOTE — Progress Notes (Signed)
Location:  [facility] clinic Provider:  Ouida Abeyta L. Mariea Clonts, D.O., C.M.D.  Code Status: FULL CODE Goals of Care:  Advanced Directives 10/14/2019  Does Patient Have a Medical Advance Directive? Yes  Type of Advance Directive Living will  Does patient want to make changes to medical advance directive? No - Patient declined  Copy of [place] in Chart? -  Would patient like information on creating a medical advance directive? -     Chief Complaint  Patient presents with  . Medical Management of Chronic Issues    4 month follow up    HPI: Patient is a 79 y.o. male seen today for medical management of chronic diseases.    He still has dyspnea on exertion.  Last week, he got up about 4am b/c his chest was hurting right in the middle, then his left arm went numb and it was tingly on the end of his fingers.  Numbness lasted longest--not 30 minutes.  His wife recommended he go to the ED, but he refused.  It resolved and has not recurred.  He's been getting the cholesterol shots and he's trying to blame them for his sob that he had before.  Takes one shot on the first and another on the 15th.    He has a mole on his left side of his abdomen.  He had a black one on his back before that was removed.  He says it does not feel just right.    I reminded him that I recommend he and his wife get their covid vaccines.    He says the older he gets, the sorer things are.  He stays active mowing yards, cutting wood.  Talked about if you don't use it, you lose it.  He does not want to stop doing things and I encouraged him to continue to do what he can.  This also goes for the brain.  He talks about a sister with dementia in a facility in [place].  BP at home runs 0000000 systolic.    Past Medical History:  Diagnosis Date  . Allergic rhinitis, cause unspecified 09/08/2006  . Carpal tunnel syndrome 03/16/2009  . Chest pain, unspecified 08/20/2012  . Chest pain, unspecified 12/26/2011  .  Concussion, unspecified 10/30/1994  . COPD (chronic obstructive pulmonary disease) ([place]) 02/24/2006  . Corns and callosities 04/11/2007  . Dermatophytosis of the body   . Disturbance of skin sensation   . Diverticulosis of colon (without mention of hemorrhage) 06/14/2010  . Edema of male genital organs 08/20/2012  . Elevated blood pressure reading without diagnosis of hypertension 06/14/2010  . Encounter for long-term (current) use of other medications   . Hypercholesterolemia   . Hypertrophy of prostate without urinary obstruction and other lower urinary tract symptoms (LUTS)   . 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 Lumbago 05/11/2009  . Nocturia 08/20/2012  . Other and unspecified hyperlipidemia 02/24/2006  . Pain in joint, ankle and foot 04/11/2007  . Pterygium, unspecified 03/01/2006  . Rash and other nonspecific skin eruption 12/28/1999  . Seborrheic dermatitis, unspecified 03/21/2001  . 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 Tobacco use disorder   . Ulcer of other part of foot 03/16/2007  . Unspecified constipation 05/11/2009  . Urinary tract infection, site not specified 05/11/2009  . Ventral hernia, unspecified, without mention of obstruction or gangrene     Past Surgical History:  Procedure Laterality Date  . ELBOW SURGERY  Right   . EYE SURGERY     Cataract Removal  . FRACTURE SURGERY  1986   right hip, right elbow  . KNEE SURGERY Right 01/2007   knee cap injury  . SKIN CANCER EXCISION  07/2014   Back and head   . TONSILLECTOMY  1948    Allergies  Allergen Reactions  . Zocor [Simvastatin] Shortness Of Breath    Sob and itching  . Ace Inhibitors Cough    Tolerating ARB instead  . Lipitor [Atorvastatin]     Muscle and bone pain  . Pravachol [Pravastatin Sodium] Other (See Comments)    myalgias    Outpatient Encounter Medications as of 10/14/2019  Medication Sig  . aspirin 81 MG tablet Take 81 mg by mouth  daily.   . Evolocumab 140 MG/ML SOAJ Inject 1 Dose into the skin every 14 (fourteen) days.  Marland Kitchen ibuprofen (ADVIL,MOTRIN) 200 MG tablet Take 400 mg by mouth every 4 (four) hours as needed for fever, headache, mild pain, moderate pain or cramping.  Marland Kitchen omeprazole (PRILOSEC) 20 MG capsule TAKE 1 CAPSULE (20 MG TOTAL) BY MOUTH TWO TIMES DAILY BEFORE A MEAL.  . tamsulosin (FLOMAX) 0.4 MG CAPS capsule TAKE 1 CAPSULE EVERY DAY  . valsartan (DIOVAN) 80 MG tablet TAKE 1 TABLET EVERY DAY   No facility-administered encounter medications on file as of 10/14/2019.    Review of Systems:  Review of Systems  Constitutional: Negative for chills, fever and malaise/fatigue.  HENT: Positive for hearing loss. Negative for congestion.   Eyes: Negative for blurred vision.  Respiratory: Positive for shortness of breath. Negative for cough, sputum production and wheezing.   Cardiovascular: Positive for chest pain. Negative for palpitations, orthopnea, leg swelling and PND.  Gastrointestinal: Negative for abdominal pain, constipation and diarrhea.  Genitourinary: Negative for dysuria.  Musculoskeletal: Positive for joint pain. Negative for falls.  Skin: Negative for itching and rash.  Neurological: Negative for dizziness and loss of consciousness.  Endo/Heme/Allergies: Bruises/bleeds easily.  Psychiatric/Behavioral: Negative for depression and memory loss. The patient is not nervous/anxious and does not have insomnia.     Health Maintenance  Topic Date Due  . COVID-19 Vaccine (1) 10/14/2019 (Originally 05/14/1957)  . INFLUENZA VACCINE  01/05/2020  . TETANUS/TDAP  12/04/2023  . PNA vac Low Risk Adult  Completed    Physical Exam: Vitals:   10/14/19 1001  BP: (!) 142/82  Pulse: 60  Temp: 97.8 F (36.6 C)  TempSrc: Temporal  SpO2: 97%  Weight: 140 lb (63.5 kg)  Height: 5\' 7"  (1.702 m)   Body mass index is 21.93 kg/m. Physical Exam Vitals reviewed.  Constitutional:      Appearance: Normal appearance.    HENT:     Head: Normocephalic and atraumatic.  Cardiovascular:     Rate and Rhythm: Normal rate and regular rhythm.     Pulses: Normal pulses.     Heart sounds: Normal heart sounds.  Pulmonary:     Effort: Pulmonary effort is normal.     Breath sounds: Normal breath sounds. No wheezing, rhonchi or rales.  Abdominal:     General: Bowel sounds are normal. There is no distension.     Palpations: Abdomen is soft.     Tenderness: There is no abdominal tenderness. There is no guarding or rebound.  Musculoskeletal:        General: Normal range of motion.     Right lower leg: No edema.     Left lower leg: No edema.  Skin:  General: Skin is warm and dry.     Comments: Left lateral abdomen with pencil eraser sized dark brown, almost black nevus  Neurological:     General: No focal deficit present.     Mental Status: He is alert and oriented to person, place, and time.  Psychiatric:        Mood and Affect: Mood normal.     Labs reviewed: Basic Metabolic Panel: Recent Labs    11/12/18 0830 05/23/19 0832 10/10/19 0825  NA 137 139 135  K 4.6 4.6 4.6  CL 103 104 102  CO2 28 24 27   GLUCOSE 97 95 96  BUN 13 14 14   CREATININE 1.20* 1.40* 1.47*  CALCIUM 9.1 8.9 9.4   Liver Function Tests: Recent Labs    11/12/18 0830 05/23/19 0832 10/10/19 0825  AST 14 17 18   ALT 7* 8* 8*  BILITOT 0.4 0.4 0.6  PROT 6.8 6.6 6.8   No results for input(s): LIPASE, AMYLASE in the last 8760 hours. No results for input(s): AMMONIA in the last 8760 hours. CBC: Recent Labs    11/12/18 0830 05/23/19 0832 10/10/19 0825  WBC 5.4 4.4 4.5  NEUTROABS 3,623 2,763 2,637  HGB 13.5 12.7* 13.1*  HCT 40.2 39.7 41.0  MCV 87.8 82.2 85.1  PLT 226 234 220   Lipid Panel: Recent Labs    11/12/18 0830 05/23/19 0832 10/10/19 0825  CHOL 260* 269* 192  HDL 50 50 56  LDLCALC 192* 192* 117*  TRIG 78 135 86  CHOLHDL 5.2* 5.4* 3.4   Lab Results  Component Value Date   HGBA1C 5.8 (H) 10/10/2019     Assessment/Plan 1. Nevus - does not appear atypical or worrisome to me, but he is overdue for an annual skin exam at [facility] - Ambulatory referral to Dermatology  2. COPD mixed type ([place]) -not on inhalers and refuses to try albuterol when dyspneic  3. 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 Hypercholesterolemia -LDL down to 117 from 192 in dec since taking evolocumab injections q 2 wks  Labs/tests ordered:   No new  Next appt:  10/14/2019    Truly Stankiewicz L. Torrin Frein, D.O. [facility] [address], [place] [address] Cell Phone (Mon-Fri 8am-5pm):  [phone] On Call:  [phone] & follow prompts after 5pm & weekends Office Phone:  [phone] Office Fax:  [phone]

## 2019-10-16 ENCOUNTER — Encounter: Payer: Self-pay | Admitting: Internal Medicine

## 2019-10-16 ENCOUNTER — Telehealth (INDEPENDENT_AMBULATORY_CARE_PROVIDER_SITE_OTHER): Payer: Medicare HMO | Admitting: Internal Medicine

## 2019-10-16 DIAGNOSIS — R072 Precordial pain: Secondary | ICD-10-CM | POA: Diagnosis not present

## 2019-10-16 DIAGNOSIS — E785 Hyperlipidemia, unspecified: Secondary | ICD-10-CM | POA: Diagnosis not present

## 2019-10-16 DIAGNOSIS — R931 Abnormal findings on diagnostic imaging of heart and coronary circulation: Secondary | ICD-10-CM

## 2019-10-16 MED ORDER — NITROGLYCERIN 0.4 MG SL SUBL: 0.4000 mg | SUBLINGUAL_TABLET | SUBLINGUAL | 3 refills | Status: DC | PRN

## 2019-10-16 MED ORDER — EZETIMIBE 10 MG PO TABS: 10.0000 mg | ORAL_TABLET | Freq: Every day | ORAL | 3 refills | Status: DC

## 2019-10-16 NOTE — Patient Instructions (Signed)
Medication Instructions:  START zetia 10mg  daily Dr. Debara Pickett has prescribed nitroglycerin to use as needed for chest pain  *If you need a refill on your cardiac medications before your next appointment, please call your pharmacy*   Lab Work: FASTING lab work in 3 months to check cholesterol   If you have labs (blood work) drawn today and your tests are completely normal, you will receive your results only by: Marland Kitchen MyChart Message (if you have MyChart) OR . A paper copy in the mail If you have any lab test that is abnormal or we need to change your treatment, we will call you to review the results.   Testing/Procedures: NONE   Follow-Up: At [facility], you and your health needs are our priority.  As part of our continuing mission to provide you with exceptional heart care, we have created designated Provider Care Teams.  These Care Teams include your primary Cardiologist (physician) and Advanced Practice Providers (APPs -  Physician Assistants and Nurse Practitioners) who all work together to provide you with the care you need, when you need it.  We recommend signing up for the patient portal called "MyChart".  Sign up information is provided on this After Visit Summary.  MyChart is used to connect with patients for Virtual Visits (Telemedicine).  Patients are able to view lab/test results, encounter notes, upcoming appointments, etc.  Non-urgent messages can be sent to your provider as well.   To learn more about what you can do with MyChart, go to [email or web address].    Your next appointment:   3 month(s) - lipid clinic  The format for your next appointment:   Either In Person or Virtual  Provider:   K. Mali Hilty, MD   Other Instructions

## 2019-10-16 NOTE — Progress Notes (Signed)
Virtual Visit via Telephone Note   This visit type was conducted due to national recommendations for restrictions regarding the COVID-19 Pandemic (e.g. social distancing) in an effort to limit this patient's exposure and mitigate transmission in our community.  Due to his co-morbid illnesses, this patient is at least at moderate risk for complications without adequate follow up.  This format is felt to be most appropriate for this patient at this time.  The patient did not have access to video technology/had technical difficulties with video requiring transitioning to audio format only (telephone).  All issues noted in this document were discussed and addressed.  No physical exam could be performed with this format.  Please refer to the patient's chart for his  consent to telehealth for [facility].   Evaluation Performed:  Telephone follow-up  Date:  10/16/2019   ID:  Thomas Reyes, DOB May 31, 1941, MRN [id number]  Patient Location:  [place] [address]  Provider location:   [address] [place], [place] [address]  PCP:  Gayland Curry, DO  Cardiologist:  No primary care provider on file. Electrophysiologist:  None   Chief Complaint:  Chest pain, left arm numbness, shortness of breath  History of Present Illness:    Thomas Reyes is a 79 y.o. male who presents via audio/video conferencing for a telehealth visit today.  Thomas Reyes is a 79 y.o. male who is being seen today for the evaluation of dyslipidemia at the request of Mariea Clonts, [place], DO.  This is a pleasant 79 year old male with a history of tobacco abuse, prior pack per day smoker for 40 to 50 years, with history of COPD not on inhalers, who recently had progressive symptoms of dyspnea on exertion and underwent nuclear stress testing in our office several days ago which was negative for ischemia.  He was referred for lipid management due to persistent dyslipidemia.  He  reports has been off of statins for about 3 years due to significant myalgias.  In the past he has tried Lipitor, Zocor and Pravachol, all of which caused side effects.  His total cholesterol was 269, HDL 50, triglycerides 135 and LDL 192.  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  In addition he had a CT scan of the chest last year which showed multivessel coronary calcium and aortic atherosclerosis.  Based on these findings he needs significant, in fact greater than 50% reduction in LDL cholesterol due to recent consensus guidelines in 2018.  Since he is unable to tolerate high potency statins, he would not likely reach these targets without a PCSK9 inhibitor.  10/16/2019  Thomas Reyes is seen today via telephone visit.  He is doing well on Repatha.  Good response to cholesterol with total of 192, down from 269.  HDL 56, triglycerides 86 and LDL of 117 (reduced from 192).  He noted 1 episode the other evening where he had chest pressure at night.  He said it lasted only a few minutes but then he had some left arm numbness and tingling which eventually resolved.  He is also had shortness of breath.  I suspect this is COPD as he is not on any inhalers.  He did have nuclear stress testing in January of this year which was negative for ischemia however based on multivessel calcification, I would have some concern for possible balanced ischemia (underrecognized multivessel obstructive coronary disease).  The patient does not have symptoms concerning for COVID-19 infection (fever,  chills, cough, or new SHORTNESS OF BREATH).    Prior CV studies:   The following studies were reviewed today:  Chart reviewed  PMHx:  Past Medical History:  Diagnosis Date  . Allergic rhinitis, cause unspecified 09/08/2006  . Carpal tunnel syndrome 03/16/2009  . Chest pain, unspecified 08/20/2012  . Chest pain, unspecified 12/26/2011  . Concussion, unspecified 10/30/1994  . COPD (chronic obstructive pulmonary  disease) ([place]) 02/24/2006  . Corns and callosities 04/11/2007  . Dermatophytosis of the body   . Disturbance of skin sensation   . Diverticulosis of colon (without mention of hemorrhage) 06/14/2010  . Edema of male genital organs 08/20/2012  . Elevated blood pressure reading without diagnosis of hypertension 06/14/2010  . Encounter for long-term (current) use of other medications   . Hypercholesterolemia   . Hypertrophy of prostate without urinary obstruction and other lower urinary tract symptoms (LUTS)   . 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 Lumbago 05/11/2009  . Nocturia 08/20/2012  . Other and unspecified hyperlipidemia 02/24/2006  . Pain in joint, ankle and foot 04/11/2007  . Pterygium, unspecified 03/01/2006  . Rash and other nonspecific skin eruption 12/28/1999  . Seborrheic dermatitis, unspecified 03/21/2001  . 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 Tobacco use disorder   . Ulcer of other part of foot 03/16/2007  . Unspecified constipation 05/11/2009  . Urinary tract infection, site not specified 05/11/2009  . Ventral hernia, unspecified, without mention of obstruction or gangrene     Past Surgical History:  Procedure Laterality Date  . ELBOW SURGERY Right   . EYE SURGERY     Cataract Removal  . FRACTURE SURGERY  1986   right hip, right elbow  . KNEE SURGERY Right 01/2007   knee cap injury  . SKIN CANCER EXCISION  07/2014   Back and head   . TONSILLECTOMY  1948    FAMHx:  Family History  Problem Relation Age of Onset  . Diabetes Mother   . Hypertension Mother   . Cancer Father     SOCHx:   reports that he quit smoking about 9 years ago. He has never used smokeless tobacco. He reports that he does not drink alcohol or use drugs.  ALLERGIES:  Allergies  Allergen Reactions  . Zocor [Simvastatin] Shortness Of Breath    Sob and itching  . Ace Inhibitors Cough    Tolerating ARB instead  . Lipitor  [Atorvastatin]     Muscle and bone pain  . Pravachol [Pravastatin Sodium] Other (See Comments)    myalgias    MEDS:  Current Meds  Medication Sig  . aspirin 81 MG tablet Take 81 mg by mouth daily.   . Evolocumab 140 MG/ML SOAJ Inject 1 Dose into the skin every 14 (fourteen) days.  Marland Kitchen ibuprofen (ADVIL,MOTRIN) 200 MG tablet Take 400 mg by mouth every 4 (four) hours as needed for fever, headache, mild pain, moderate pain or cramping.  Marland Kitchen omeprazole (PRILOSEC) 20 MG capsule TAKE 1 CAPSULE TWO TIMES DAILY BEFORE A MEAL.  . tamsulosin (FLOMAX) 0.4 MG CAPS capsule TAKE 1 CAPSULE EVERY DAY  . valsartan (DIOVAN) 80 MG tablet TAKE 1 TABLET EVERY DAY     ROS: Pertinent items noted in HPI and remainder of comprehensive ROS otherwise negative.  Labs/Other Tests and Data Reviewed:    Recent Labs: 10/10/2019: ALT 8; BUN 14; Creat 1.47; Hemoglobin 13.1; Platelets 220; Potassium 4.6; Sodium 135   Recent Lipid Panel Lab Results  Component Value Date/Time   CHOL 192 10/10/2019 08:25 AM   CHOL 252 (H) 04/01/2015 08:51 AM   TRIG 86 10/10/2019 08:25 AM   HDL 56 10/10/2019 08:25 AM   HDL 58 04/01/2015 08:51 AM   CHOLHDL 3.4 10/10/2019 08:25 AM   LDLCALC 117 (H) 10/10/2019 08:25 AM    Wt Readings from Last 3 Encounters:  10/14/19 140 lb (63.5 kg)  07/02/19 139 lb (63 kg)  06/12/19 138 lb (62.6 kg)     Exam:    Vital Signs:  There were no vitals taken for this visit.   Exam not performed due to telephone visit  ASSESSMENT & PLAN:    1. Precordial pain, possible unstable angina 2. Multivessel coronary artery calcification 3. Longstanding tobacco abuse with probable COPD, on inhalers 4. Recent progressive dyspnea on exertion-negative Myoview stress test (06/2019) 5. Essential hypertension 6. Statin myalgias  Thomas Reyes described short lived chest pressure with some left arm numbness and tingling.  This resolved spontaneously.  He is not had any exertional episodes but does get dyspneic  with exertion.  I suspect there is COPD however he does have multivessel coronary calcification.  Nuclear stress testing was recently negative for ischemia however balanced ischemia could not be excluded.  I told him to watch for further episodes of chest discomfort and if he has it that does not go away he should present to the emergency department.  We will provide sublingual nitroglycerin for him.  In addition his lipids, although improved still remain higher than ideal.  I would like to add ezetimibe 10 mg daily to his regimen.  We will plan to repeat lipid profile in about 3 months and follow-up with me afterwards.  Should he have more symptoms concerning for angina again I would advise he present to the emergency department or contact our office to be seen more urgently.  COVID-19 Education: The signs and symptoms of COVID-19 were discussed with the patient and how to seek care for testing (follow up with PCP or arrange E-visit).  The importance of social distancing was discussed today.  Patient Risk:   After full review of this patients clinical status, I feel that they are at least moderate risk at this time.  Time:   Today, I have spent 25 minutes with the patient with telehealth technology discussing chest pain, coronary calcification, dyslipidemia.     Medication Adjustments/Labs and Tests Ordered: Current medicines are reviewed at length with the patient today.  Concerns regarding medicines are outlined above.   Tests Ordered: No orders of the defined types were placed in this encounter.   Medication Changes: No orders of the defined types were placed in this encounter.   Disposition:  in 3 month(s)  Pixie Casino, MD, [facility], [place] Director of the Advanced Lipid Disorders &  Cardiovascular Risk Reduction Clinic Diplomate of the American Board of Clinical Lipidology Attending Cardiologist  Direct Dial: [phone]  Fax: [phone]    Website:  [email or web address]  Pixie Casino, MD  10/16/2019 8:56 AM

## 2019-10-22 DIAGNOSIS — L821 Other seborrheic keratosis: Secondary | ICD-10-CM | POA: Diagnosis not present

## 2019-11-01 ENCOUNTER — Telehealth: Payer: Self-pay

## 2019-11-01 NOTE — Telephone Encounter (Signed)
Let's reduce his diovan to 40mg  daily.  Continue blood pressure checks.  If bp running consistently over 140 top number, we'll need to figure out a different regimen for him.

## 2019-11-01 NOTE — Telephone Encounter (Signed)
Patient's wife, Bethena Roys, called with concerns about her husband's BP. She took it and it was 104/60 about 10-15 minutes later she retook it and it was 99/60 wants to know if there is anything she should worry about. Also stated he has been having some dizzy spells lately. Please advise

## 2019-11-05 NOTE — Telephone Encounter (Signed)
Spoke with patient's wife she agreed to reduce diovan to 40 mg and keep check on BP

## 2019-11-25 ENCOUNTER — Ambulatory Visit (INDEPENDENT_AMBULATORY_CARE_PROVIDER_SITE_OTHER): Payer: Medicare HMO | Admitting: Nurse Practitioner

## 2019-11-25 ENCOUNTER — Encounter: Payer: Self-pay | Admitting: Nurse Practitioner

## 2019-11-25 ENCOUNTER — Other Ambulatory Visit: Payer: Self-pay

## 2019-11-25 VITALS — Temp 97.5°F | Resp 16 | Ht 67.0 in | Wt 136.2 lb

## 2019-11-25 DIAGNOSIS — N1831 Chronic kidney disease, stage 3a: Secondary | ICD-10-CM

## 2019-11-25 DIAGNOSIS — R42 Dizziness and giddiness: Secondary | ICD-10-CM | POA: Diagnosis not present

## 2019-11-25 DIAGNOSIS — M542 Cervicalgia: Secondary | ICD-10-CM | POA: Diagnosis not present

## 2019-11-25 DIAGNOSIS — I10 Essential (primary) hypertension: Secondary | ICD-10-CM

## 2019-11-25 NOTE — Patient Instructions (Addendum)
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

## 2019-11-25 NOTE — Progress Notes (Signed)
Careteam: Patient Care Team: Gayland Curry, DO as PCP - General (Geriatric Medicine) Warden Fillers, MD as Consulting Physician (Ophthalmology)  PLACE OF SERVICE:  Home Directive information Does Patient Have a Medical Advance Directive?: Yes, Type of Advance Directive: Living will, Does patient want to make changes to medical advance directive?: No - Patient declined  Allergies  Allergen Reactions  . Zocor [Simvastatin] Shortness Of Breath    Sob and itching  . Ace Inhibitors Cough    Tolerating ARB instead  . Lipitor [Atorvastatin]     Muscle and bone pain  . Pravachol [Pravastatin Sodium] Other (See Comments)    myalgias    Chief Complaint  Patient presents with  . Acute Visit    Complains of Dizziness and Low BP     HPI: Patient is a 79 y.o. male due to worsening dizziness. Reports dizziness that has been going on for over a month. Reports he is dizzy when he leans over and gets back up. He played golf on Saturday and felt like he was going to pass out when he put the ball down and then stood back up. sbp has been 101-111. Did not take blood pressure medication last night.  They had already reduced valsartan to 40 mg daily due to low bp.  Does not drink much water, "hates water" Drinking 2 cups of coffee in the morning and then Pepsis during the day Will drink a Gatorade if he mows the yard.  Hx of chest pain but none recently.  Mom and sister had issues with being dizziness.  Does not get dizzy going from sitting to standing (occasionally) Reports most days he does not eat until 3pm, states he gets up early and goes out to work on yards, will get dizzy and lightheaded on these days, when he eats this improves symptoms.  States when he gets up in the morning his left arm which has been going on for several weeks- been noticing it for at least 3 weeks. Reports neck pain that has been on and off as well. Reports his pillow is "no good" after he  gets up and moves around seems to get better. No weakness to arms.    Review of Systems:  Review of Systems  Constitutional: Negative for chills, fever and weight loss.  HENT: Negative for hearing loss and tinnitus.   Respiratory: Negative for cough, sputum production and shortness of breath.   Cardiovascular: Negative for chest pain, palpitations and leg swelling.  Gastrointestinal: Negative for abdominal pain, constipation, diarrhea and heartburn.  Genitourinary: Negative for dysuria, frequency and urgency.  Musculoskeletal: Positive for myalgias and neck pain. Negative for back pain, falls and joint pain.  Skin: Negative.   Neurological: Positive for dizziness and sensory change (to fingers in the morning, resolves). Negative for speech change, focal weakness, weakness and headaches.  Psychiatric/Behavioral: Negative for depression and memory loss. The patient does not have insomnia.     Past Medical History:  Diagnosis Date  . Allergic rhinitis, cause unspecified 09/08/2006  . Carpal tunnel syndrome 03/16/2009  . Chest pain, unspecified 08/20/2012  . Chest pain, unspecified 12/26/2011  . Concussion, unspecified 10/30/1994  . COPD (chronic obstructive pulmonary disease) ([place]) 02/24/2006  . Corns and callosities 04/11/2007  . Dermatophytosis of the body   . Disturbance of skin sensation   . Diverticulosis of colon (without mention of hemorrhage) 06/14/2010  . Edema of male genital organs 08/20/2012  . Elevated blood pressure reading without  diagnosis of hypertension 06/14/2010  . Encounter for long-term (current) use of other medications   . Hypercholesterolemia   . Hypertrophy of prostate without urinary obstruction and other lower urinary tract symptoms (LUTS)   . 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 Lumbago 05/11/2009  . Nocturia 08/20/2012  . Other and unspecified hyperlipidemia 02/24/2006  . Pain in joint, ankle and foot 04/11/2007  . Pterygium, unspecified  03/01/2006  . Rash and other nonspecific skin eruption 12/28/1999  . Seborrheic dermatitis, unspecified 03/21/2001  . 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 Tobacco use disorder   . Ulcer of other part of foot 03/16/2007  . Unspecified constipation 05/11/2009  . Urinary tract infection, site not specified 05/11/2009  . Ventral hernia, unspecified, without mention of obstruction or gangrene    Past Surgical History:  Procedure Laterality Date  . ELBOW SURGERY Right   . EYE SURGERY     Cataract Removal  . FRACTURE SURGERY  1986   right hip, right elbow  . KNEE SURGERY Right 01/2007   knee cap injury  . SKIN CANCER EXCISION  07/2014   Back and head   . TONSILLECTOMY  1948   Social History:   reports that he quit smoking about 9 years ago. He has never used smokeless tobacco. He reports that he does not drink alcohol and does not use drugs.  Family History  Problem Relation Age of Onset  . Diabetes Mother   . Hypertension Mother   . Cancer Father     Medications: Patient's Medications  New Prescriptions   No medications on file  Previous Medications   ASPIRIN 81 MG TABLET    Take 81 mg by mouth daily.    EVOLOCUMAB 140 MG/ML SOAJ    Inject 1 Dose into the skin every 14 (fourteen) days.   EZETIMIBE (ZETIA) 10 MG TABLET    Take 1 tablet (10 mg total) by mouth daily.   IBUPROFEN (ADVIL,MOTRIN) 200 MG TABLET    Take 400 mg by mouth every 4 (four) hours as needed for fever, headache, mild pain, moderate pain or cramping.   NITROGLYCERIN (NITROSTAT) 0.4 MG SL TABLET    Place 1 tablet (0.4 mg total) under the tongue every 5 (five) minutes as needed for chest pain (max 3 doses).   OMEPRAZOLE (PRILOSEC) 20 MG CAPSULE    TAKE 1 CAPSULE TWO TIMES DAILY BEFORE A MEAL.   TAMSULOSIN (FLOMAX) 0.4 MG CAPS CAPSULE    TAKE 1 CAPSULE EVERY DAY   VALSARTAN (DIOVAN) 80 MG TABLET    TAKE 1 TABLET EVERY DAY  Modified Medications   No medications on  file  Discontinued Medications   No medications on file    Physical Exam:  Vitals:   11/25/19 1126  Resp: 16  Temp: (!) 97.5 F (36.4 C)  SpO2: 98%  Weight: 136 lb 3.2 oz (61.8 kg)  Height: 5\' 7"  (1.702 m)   Body mass index is 21.33 kg/m. Wt Readings from Last 3 Encounters:  11/25/19 136 lb 3.2 oz (61.8 kg)  10/14/19 140 lb (63.5 kg)  07/02/19 139 lb (63 kg)   Orthostatic VS for the past 72 hrs (Last 3 readings):  Orthostatic BP Patient Position BP Location Cuff Size Orthostatic Pulse  11/25/19 1226 -- -- -- -- 50  11/25/19 1225 132/70 Sitting -- -- 54  11/25/19 1224 138/70 Sitting -- -- 53  11/25/19 1220 132/70 -- Left Arm Normal (!) 47  11/25/19 1126 132/80 -- -- -- 52     Physical Exam Constitutional:      General: He is not in acute distress.    Appearance: He is well-developed. He is not diaphoretic.  HENT:     Head: Normocephalic and atraumatic.     Mouth/Throat:     Mouth: Mucous membranes are moist.     Comments: Chronic left facial droop Eyes:     Conjunctiva/sclera: Conjunctivae normal.     Pupils: Pupils are equal, round, and reactive to light.  Cardiovascular:     Rate and Rhythm: Normal rate and regular rhythm.     Heart sounds: Normal heart sounds.  Pulmonary:     Effort: Pulmonary effort is normal.     Breath sounds: Normal breath sounds.  Abdominal:     General: Bowel sounds are normal.     Palpations: Abdomen is soft.  Musculoskeletal:        General: No tenderness.     Cervical back: Normal range of motion and neck supple.  Skin:    General: Skin is warm and dry.  Neurological:     General: No focal deficit present.     Mental Status: He is alert and oriented to person, place, and time.     Sensory: No sensory deficit.     Motor: No weakness.     Coordination: Coordination normal.     Gait: Gait normal.     Deep Tendon Reflexes: Reflexes normal.  Psychiatric:        Mood and Affect: Mood normal.        Behavior: Behavior normal.     Labs reviewed: Basic Metabolic Panel: Recent Labs    05/23/19 0832 10/10/19 0825  NA 139 135  K 4.6 4.6  CL 104 102  CO2 24 27  GLUCOSE 95 96  BUN 14 14  CREATININE 1.40* 1.47*  CALCIUM 8.9 9.4   Liver Function Tests: Recent Labs    05/23/19 0832 10/10/19 0825  AST 17 18  ALT 8* 8*  BILITOT 0.4 0.6  PROT 6.6 6.8   No results for input(s): LIPASE, AMYLASE in the last 8760 hours. No results for input(s): AMMONIA in the last 8760 hours. CBC: Recent Labs    05/23/19 0832 10/10/19 0825  WBC 4.4 4.5  NEUTROABS 2,763 2,637  HGB 12.7* 13.1*  HCT 39.7 41.0  MCV 82.2 85.1  PLT 234 220   Lipid Panel: Recent Labs    05/23/19 0832 10/10/19 0825  CHOL 269* 192  HDL 50 56  LDLCALC 192* 117*  TRIG 135 86  CHOLHDL 5.4* 3.4   TSH: No results for input(s): TSH in the last 8760 hours. A1C: Lab Results  Component Value Date   HGBA1C 5.8 (H) 10/10/2019     Assessment/Plan 1. Dizziness -reports mostly when going from looking down to standing up quickly, no orthostatic hypotension -encouraged proper hydration at this time and proper nutrition (as he is going hours without eating and working). Reports some of the dizziness and lightheadedness likely coming from lack of proper nutrition.  - TSH - BASIC METABOLIC PANEL WITH GFR - CBC with Differential/Platelet  2. Neck pain on left side -off and on, reports "not that bad" does not wish to have imaging done at this time- in agreement with this. To use heating pad twice daily, can use muscle rub after heat, supportive pillow recommended. Tylenol as needed pain. To follow up if pain worsens.   3. Essential hypertension -low blood  pressure noted, he has not taken losartan, blood pressure stable at this time. Will cont to monitor BP at home off losartan. Goal <140/90. To notify if blood pressure worsens. Continue dietary modifications.   4. Stage 3a chronic kidney disease -pt drinking no plain water, coffee and soda  only, stressed importance of proper hydration with water. Also to avoid NSAIDS, to use tylenol PRN pain  Next appt: 04/16/2020 as scheduled, sooner if needed  Korin Setzler K. Girard, [facility] [phone]

## 2019-11-26 LAB — CBC WITH DIFFERENTIAL/PLATELET
Absolute Monocytes: 424 cells/uL (ref 200–950)
Basophils Absolute: 11 cells/uL (ref 0–200)
Basophils Relative: 0.2 %
Eosinophils Absolute: 133 cells/uL (ref 15–500)
Eosinophils Relative: 2.5 %
HCT: 40 % (ref 38.5–50.0)
Hemoglobin: 12.7 g/dL — ABNORMAL LOW (ref 13.2–17.1)
Lymphs Abs: 1134 cells/uL (ref 850–3900)
MCH: 26.7 pg — ABNORMAL LOW (ref 27.0–33.0)
MCHC: 31.8 g/dL — ABNORMAL LOW (ref 32.0–36.0)
MCV: 84 fL (ref 80.0–100.0)
MPV: 10.4 fL (ref 7.5–12.5)
Monocytes Relative: 8 %
Neutro Abs: 3599 cells/uL (ref 1500–7800)
Neutrophils Relative %: 67.9 %
Platelets: 232 10*3/uL (ref 140–400)
RBC: 4.76 10*6/uL (ref 4.20–5.80)
RDW: 13.7 % (ref 11.0–15.0)
Total Lymphocyte: 21.4 %
WBC: 5.3 10*3/uL (ref 3.8–10.8)

## 2019-11-26 LAB — BASIC METABOLIC PANEL WITH GFR
BUN/Creatinine Ratio: 11 (calc) (ref 6–22)
BUN: 17 mg/dL (ref 7–25)
CO2: 25 mmol/L (ref 20–32)
Calcium: 8.7 mg/dL (ref 8.6–10.3)
Chloride: 103 mmol/L (ref 98–110)
Creat: 1.48 mg/dL — ABNORMAL HIGH (ref 0.70–1.18)
GFR, Est African American: 51 mL/min/{1.73_m2} — ABNORMAL LOW (ref >=60)
GFR, Est Non African American: 44 mL/min/{1.73_m2} — ABNORMAL LOW (ref >=60)
Glucose, Bld: 92 mg/dL (ref 65–99)
Potassium: 4.8 mmol/L (ref 3.5–5.3)
Sodium: 135 mmol/L (ref 135–146)

## 2019-11-26 LAB — TSH: TSH: 2.93 mIU/L (ref 0.40–4.50)

## 2019-12-04 DIAGNOSIS — Z961 Presence of intraocular lens: Secondary | ICD-10-CM | POA: Diagnosis not present

## 2019-12-04 DIAGNOSIS — H11042 Peripheral pterygium, stationary, left eye: Secondary | ICD-10-CM | POA: Diagnosis not present

## 2019-12-04 DIAGNOSIS — H353131 Nonexudative age-related macular degeneration, bilateral, early dry stage: Secondary | ICD-10-CM | POA: Diagnosis not present

## 2019-12-04 DIAGNOSIS — H10413 Chronic giant papillary conjunctivitis, bilateral: Secondary | ICD-10-CM | POA: Diagnosis not present

## 2019-12-04 DIAGNOSIS — H04123 Dry eye syndrome of bilateral lacrimal glands: Secondary | ICD-10-CM | POA: Diagnosis not present

## 2019-12-04 DIAGNOSIS — H43811 Vitreous degeneration, right eye: Secondary | ICD-10-CM | POA: Diagnosis not present

## 2019-12-05 ENCOUNTER — Telehealth: Payer: Self-pay | Admitting: Internal Medicine

## 2019-12-05 NOTE — Telephone Encounter (Signed)
PA for repatha submitted via [employer] PA Case: [id number], Status: Approved, Coverage Starts on: 06/07/2019 12:00:00 AM, Coverage Ends on: 06/05/2020 12:00:00 AM

## 2019-12-10 DIAGNOSIS — H524 Presbyopia: Secondary | ICD-10-CM | POA: Diagnosis not present

## 2019-12-10 DIAGNOSIS — H52223 Regular astigmatism, bilateral: Secondary | ICD-10-CM | POA: Diagnosis not present

## 2020-01-21 DIAGNOSIS — E785 Hyperlipidemia, unspecified: Secondary | ICD-10-CM | POA: Diagnosis not present

## 2020-01-21 LAB — LIPID PANEL
Chol/HDL Ratio: 2.7 ratio (ref 0.0–5.0)
Cholesterol, Total: 138 mg/dL (ref 100–199)
HDL: 52 mg/dL (ref >39)
LDL Chol Calc (NIH): 69 mg/dL (ref 0–99)
Triglycerides: 91 mg/dL (ref 0–149)
VLDL Cholesterol Cal: 17 mg/dL (ref 5–40)

## 2020-01-22 ENCOUNTER — Encounter: Payer: Self-pay | Admitting: Internal Medicine

## 2020-03-06 ENCOUNTER — Ambulatory Visit: Payer: Medicare HMO | Admitting: Internal Medicine

## 2020-03-18 ENCOUNTER — Telehealth: Payer: Self-pay | Admitting: Internal Medicine

## 2020-03-18 NOTE — Telephone Encounter (Signed)
[employer] application for Repatha patient assistance faxed to [phone]

## 2020-03-26 ENCOUNTER — Telehealth (INDEPENDENT_AMBULATORY_CARE_PROVIDER_SITE_OTHER): Payer: Medicare HMO | Admitting: Internal Medicine

## 2020-03-26 ENCOUNTER — Encounter: Payer: Self-pay | Admitting: Internal Medicine

## 2020-03-26 VITALS — BP 128/72 | Wt 138.0 lb

## 2020-03-26 DIAGNOSIS — T466X5A Adverse effect of antihyperlipidemic and antiarteriosclerotic drugs, initial encounter: Secondary | ICD-10-CM

## 2020-03-26 DIAGNOSIS — R931 Abnormal findings on diagnostic imaging of heart and coronary circulation: Secondary | ICD-10-CM | POA: Diagnosis not present

## 2020-03-26 DIAGNOSIS — E785 Hyperlipidemia, unspecified: Secondary | ICD-10-CM | POA: Diagnosis not present

## 2020-03-26 DIAGNOSIS — M791 Myalgia, unspecified site: Secondary | ICD-10-CM | POA: Diagnosis not present

## 2020-03-26 NOTE — Progress Notes (Signed)
Virtual Visit via Telephone Note   This visit type was conducted due to national recommendations for restrictions regarding the COVID-19 Pandemic (e.g. social distancing) in an effort to limit this patient's exposure and mitigate transmission in our community.  Due to his co-morbid illnesses, this patient is at least at moderate risk for complications without adequate follow up.  This format is felt to be most appropriate for this patient at this time.  The patient did not have access to video technology/had technical difficulties with video requiring transitioning to audio format only (telephone).  All issues noted in this document were discussed and addressed.  No physical exam could be performed with this format.  Please refer to the patient's chart for his  consent to telehealth for [facility].   Evaluation Performed:  Telephone follow-up  Date:  03/26/2020   ID:  Thomas Reyes, DOB 10-Mar-1941, MRN [id number]  Patient Location:  [place] [address]  Provider location:   [address] [place] [address] [place],  [address]  PCP:  Gayland Curry, DO  Cardiologist:  No primary care provider on file. Electrophysiologist:  None   Chief Complaint:  Chest pain, left arm numbness, shortness of breath  History of Present Illness:    Thomas Reyes is a 79 y.o. male who presents via audio/video conferencing for a telehealth visit today.  Thomas Reyes is a 79 y.o. male who is being seen today for the evaluation of dyslipidemia at the request of Mariea Clonts, [place], DO.  This is a pleasant 79 year old male with a history of tobacco abuse, prior pack per day smoker for 40 to 50 years, with history of COPD not on inhalers, who recently had progressive symptoms of dyspnea on exertion and underwent nuclear stress testing in our office several days ago which was negative for ischemia.  He was referred for lipid management due to persistent dyslipidemia.  He  reports has been off of statins for about 3 years due to significant myalgias.  In the past he has tried Lipitor, Zocor and Pravachol, all of which caused side effects.  His total cholesterol was 269, HDL 50, triglycerides 135 and LDL 192.  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  In addition he had a CT scan of the chest last year which showed multivessel coronary calcium and aortic atherosclerosis.  Based on these findings he needs significant, in fact greater than 50% reduction in LDL cholesterol due to recent consensus guidelines in 2018.  Since he is unable to tolerate high potency statins, he would not likely reach these targets without a PCSK9 inhibitor.  10/16/2019  Thomas Reyes is seen today via telephone visit.  He is doing well on Repatha.  Good response to cholesterol with total of 192, down from 269.  HDL 56, triglycerides 86 and LDL of 117 (reduced from 192).  He noted 1 episode the other evening where he had chest pressure at night.  He said it lasted only a few minutes but then he had some left arm numbness and tingling which eventually resolved.  He is also had shortness of breath.  I suspect this is COPD as he is not on any inhalers.  He did have nuclear stress testing in January of this year which was negative for ischemia however based on multivessel calcification, I would have some concern for possible balanced ischemia (underrecognized multivessel obstructive coronary disease).  03/26/2020  Thomas Reyes returns today for follow-up.  He continues  to do well on Repatha.  Labs 2 months ago showed total cholesterol 138, triglycerides 91, HDL 52 and LDL 69 (this is down from an LDL of 117 and previously 192) after adding ezetimibe to his regimen.  Unfortunately, he reached the donut hole and the price of his medications 1 from $45 a month to $140 a month.  He says he cannot afford this and has been off of the medicine this month.  This will likely last to the end of the year  until he starts the new fiscal year.  We encouraged him to reach out to [employer] to try to get medication assistance.  Unfortunately the [employer] well foundation is not accepting any new coverage.   The patient does not have symptoms concerning for COVID-19 infection (fever, chills, cough, or new SHORTNESS OF BREATH).    Prior CV studies:   The following studies were reviewed today:  Chart reviewed  PMHx:  Past Medical History:  Diagnosis Date  . Allergic rhinitis, cause unspecified 09/08/2006  . Carpal tunnel syndrome 03/16/2009  . Chest pain, unspecified 08/20/2012  . Chest pain, unspecified 12/26/2011  . Concussion, unspecified 10/30/1994  . COPD (chronic obstructive pulmonary disease) ([place]) 02/24/2006  . Corns and callosities 04/11/2007  . Dermatophytosis of the body   . Disturbance of skin sensation   . Diverticulosis of colon (without mention of hemorrhage) 06/14/2010  . Edema of male genital organs 08/20/2012  . Elevated blood pressure reading without diagnosis of hypertension 06/14/2010  . Encounter for long-term (current) use of other medications   . Hypercholesterolemia   . Hypertrophy of prostate without urinary obstruction and other lower urinary tract symptoms (LUTS)   . 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 Lumbago 05/11/2009  . Nocturia 08/20/2012  . Other and unspecified hyperlipidemia 02/24/2006  . Pain in joint, ankle and foot 04/11/2007  . Pterygium, unspecified 03/01/2006  . Rash and other nonspecific skin eruption 12/28/1999  . Seborrheic dermatitis, unspecified 03/21/2001  . 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 Tobacco use disorder   . Ulcer of other part of foot 03/16/2007  . Unspecified constipation 05/11/2009  . Urinary tract infection, site not specified 05/11/2009  . Ventral hernia, unspecified, without mention of obstruction or gangrene     Past Surgical History:  Procedure Laterality Date  . ELBOW  SURGERY Right   . EYE SURGERY     Cataract Removal  . FRACTURE SURGERY  1986   right hip, right elbow  . KNEE SURGERY Right 01/2007   knee cap injury  . SKIN CANCER EXCISION  07/2014   Back and head   . TONSILLECTOMY  1948    FAMHx:  Family History  Problem Relation Age of Onset  . Diabetes Mother   . Hypertension Mother   . Cancer Father     SOCHx:   reports that he quit smoking about 9 years ago. He has never used smokeless tobacco. He reports that he does not drink alcohol and does not use drugs.  ALLERGIES:  Allergies  Allergen Reactions  . Zocor [Simvastatin] Shortness Of Breath    Sob and itching  . Ace Inhibitors Cough    Tolerating ARB instead  . Lipitor [Atorvastatin]     Muscle and bone pain  . Pravachol [Pravastatin Sodium] Other (See Comments)    myalgias    MEDS:  Current Meds  Medication Sig  . aspirin 81 MG tablet Take 81 mg by mouth  daily.   . ibuprofen (ADVIL,MOTRIN) 200 MG tablet Take 400 mg by mouth every 4 (four) hours as needed for fever, headache, mild pain, moderate pain or cramping.  Marland Kitchen omeprazole (PRILOSEC) 20 MG capsule Take 20 mg by mouth daily.  . tamsulosin (FLOMAX) 0.4 MG CAPS capsule TAKE 1 CAPSULE EVERY DAY  . valsartan (DIOVAN) 80 MG tablet Take 1 tablet by mouth daily.  . [DISCONTINUED] omeprazole (PRILOSEC) 20 MG capsule TAKE 1 CAPSULE TWO TIMES DAILY BEFORE A MEAL.     ROS: Pertinent items noted in HPI and remainder of comprehensive ROS otherwise negative.  Labs/Other Tests and Data Reviewed:    Recent Labs: 10/10/2019: ALT 8 11/25/2019: BUN 17; Creat 1.48; Hemoglobin 12.7; Platelets 232; Potassium 4.8; Sodium 135; TSH 2.93   Recent Lipid Panel Lab Results  Component Value Date/Time   CHOL 138 01/21/2020 09:07 AM   TRIG 91 01/21/2020 09:07 AM   HDL 52 01/21/2020 09:07 AM   CHOLHDL 2.7 01/21/2020 09:07 AM   CHOLHDL 3.4 10/10/2019 08:25 AM   LDLCALC 69 01/21/2020 09:07 AM   LDLCALC 117 (H) 10/10/2019 08:25 AM    Wt  Readings from Last 3 Encounters:  03/26/20 138 lb (62.6 kg)  11/25/19 136 lb 3.2 oz (61.8 kg)  10/14/19 140 lb (63.5 kg)     Exam:    Vital Signs:  BP 128/72   Wt 138 lb (62.6 kg)   BMI 21.61 kg/m    Exam not performed due to telephone visit  ASSESSMENT & PLAN:    1. Multivessel coronary artery calcification 2. Longstanding tobacco abuse with probable COPD, on inhalers 3. Recent progressive dyspnea on exertion-negative Myoview stress test (06/2019) 4. Essential hypertension 5. Statin myalgias  Thomas Reyes did have significant improvement in his lipids 2 months ago and was at target with LDL less than 70 after the addition of ezetimibe.  Unfortunately the cost of his Repatha is gone up significantly.  He is unable to afford this and I suspect his cholesterol is also risen.  We will try to provide samples and have encouraged him to reach out to the company to see if he can get assistance.  He should be out of the donut hole by next year and at which time he may be able to resume treatments.  He denies any further chest pain.  Follow-up with me annually or sooner as necessary.  COVID-19 Education: The signs and symptoms of COVID-19 were discussed with the patient and how to seek care for testing (follow up with PCP or arrange E-visit).  The importance of social distancing was discussed today.  Patient Risk:   After full review of this patients clinical status, I feel that they are at least moderate risk at this time.  Time:   Today, I have spent 25 minutes with the patient with telehealth technology discussing chest pain, coronary calcification, dyslipidemia.     Medication Adjustments/Labs and Tests Ordered: Current medicines are reviewed at length with the patient today.  Concerns regarding medicines are outlined above.   Tests Ordered: No orders of the defined types were placed in this encounter.   Medication Changes: No orders of the defined types were placed in this  encounter.   Disposition:  in 1 year(s)  Pixie Casino, MD, [facility], [place] Director of the Advanced Lipid Disorders &  Cardiovascular Risk Reduction Clinic Diplomate of the American Board of Clinical Lipidology Attending Cardiologist  Direct Dial: [phone]  Fax: [phone]  Website:  [email or web address]  Pixie Casino, MD  03/26/2020 8:42 AM

## 2020-03-26 NOTE — Patient Instructions (Signed)
Medication Instructions:  Your physician recommends that you continue on your current medications as directed. Please refer to the Current Medication list given to you today.  *If you need a refill on your cardiac medications before your next appointment, please call your pharmacy*   Lab Work: FASTING lipid panel in 1 year, before your next visit with Dr. Debara Pickett   If you have labs (blood work) drawn today and your tests are completely normal, you will receive your results only by: Marland Kitchen MyChart Message (if you have MyChart) OR . A paper copy in the mail If you have any lab test that is abnormal or we need to change your treatment, we will call you to review the results.   Testing/Procedures: NONE   Follow-Up: At [facility], you and your health needs are our priority.  As part of our continuing mission to provide you with exceptional heart care, we have created designated Provider Care Teams.  These Care Teams include your primary Cardiologist (physician) and Advanced Practice Providers (APPs -  Physician Assistants and Nurse Practitioners) who all work together to provide you with the care you need, when you need it.  We recommend signing up for the patient portal called "MyChart".  Sign up information is provided on this After Visit Summary.  MyChart is used to connect with patients for Virtual Visits (Telemedicine).  Patients are able to view lab/test results, encounter notes, upcoming appointments, etc.  Non-urgent messages can be sent to your provider as well.   To learn more about what you can do with MyChart, go to [email or web address].    Your next appointment:   12 month(s) - lipid clinic  The format for your next appointment:   In Person  Provider:   K. Mali Hilty, MD   Other Instructions  Medicare low-income subsidy ([employer]) [phone] * if you apply and are denied, the denial letter can be submitted to [place] [employer] to see if they will reconsider your  Repatha assistance application

## 2020-03-26 NOTE — Telephone Encounter (Signed)
1 repatha sample provided to patient. He is aware to pick up at [place] office while waiting on other assistance options

## 2020-03-26 NOTE — Telephone Encounter (Signed)
Patient aware he has been denied medication assistance at this time. He was advised to apply for Medicare LIS ([employer]) and if denied, denial letter can be submitted to [employer] to reconsider his application

## 2020-03-26 NOTE — Addendum Note (Signed)
Addended by: Fidel Levy on: 03/26/2020 09:16 AM   Modules accepted: Orders

## 2020-04-09 ENCOUNTER — Other Ambulatory Visit: Payer: Self-pay

## 2020-04-09 MED ORDER — EZETIMIBE 10 MG PO TABS: 10.0000 mg | ORAL_TABLET | Freq: Every day | ORAL | 3 refills | Status: DC

## 2020-04-09 MED ORDER — EVOLOCUMAB 140 MG/ML ~~LOC~~ SOAJ
1.0000 | SUBCUTANEOUS | 11 refills | Status: DC
Start: 2020-04-09 — End: 2020-08-13

## 2020-04-16 ENCOUNTER — Other Ambulatory Visit: Payer: Self-pay

## 2020-04-16 ENCOUNTER — Encounter: Payer: Self-pay | Admitting: Internal Medicine

## 2020-04-16 ENCOUNTER — Ambulatory Visit (INDEPENDENT_AMBULATORY_CARE_PROVIDER_SITE_OTHER): Payer: Medicare HMO | Admitting: Internal Medicine

## 2020-04-16 VITALS — BP 130/78 | HR 56 | Temp 97.8°F | Ht 67.0 in | Wt 133.2 lb

## 2020-04-16 DIAGNOSIS — I10 Essential (primary) hypertension: Secondary | ICD-10-CM

## 2020-04-16 DIAGNOSIS — E7800 Pure hypercholesterolemia, unspecified: Secondary | ICD-10-CM | POA: Diagnosis not present

## 2020-04-16 DIAGNOSIS — R739 Hyperglycemia, unspecified: Secondary | ICD-10-CM

## 2020-04-16 DIAGNOSIS — J449 Chronic obstructive pulmonary disease, unspecified: Secondary | ICD-10-CM | POA: Diagnosis not present

## 2020-04-16 DIAGNOSIS — Z1159 Encounter for screening for other viral diseases: Secondary | ICD-10-CM | POA: Diagnosis not present

## 2020-04-16 DIAGNOSIS — N1831 Chronic kidney disease, stage 3a: Secondary | ICD-10-CM

## 2020-04-16 MED ORDER — NITROGLYCERIN 0.4 MG SL SUBL: 0.4000 mg | SUBLINGUAL_TABLET | SUBLINGUAL | 3 refills | Status: DC | PRN

## 2020-04-16 NOTE — Patient Instructions (Addendum)
Try again in the new year to get [employer] assist for your evolocumab.  Continue the zetia.  Increase your water intake--it should be 64oz of water per day.  Juices count.  Soda and caffeinated beverages do not.  Please start your baby aspirin 81mg  daily.

## 2020-04-16 NOTE — Progress Notes (Signed)
Location:  [facility] clinic Provider:  Jearline Hirschhorn L. Mariea Clonts, D.O., C.M.D.  Code Status: FULL Goals of Care:  Advanced Directives 04/16/2020  Does Patient Have a Medical Advance Directive? Yes  Type of Advance Directive Out of facility DNR (pink MOST or yellow form);Living will  Does patient want to make changes to medical advance directive? No - Patient declined  Copy of [place] in Chart? -  Would patient like information on creating a medical advance directive? -     Chief Complaint  Patient presents with  . Medical Management of Chronic Issues    6 month follow up  . Health Maintenance    Discuss need for Hepatitis C Screening     HPI: Patient is a 79 y.o. male seen today for medical management of chronic diseases.    Hyperlipidemia:  evolocumab was too high   BP  Having a lot of cramps at night--calves and sometimes thighs.  He's not been drinking water like he should.    Talks about being really sore after working on the house by the church in the crawl space.    He still gets short of breath with exertion.  He ran out of baby asa and keeps forgetting to get it.    He's lost 5 lbs.  Appetite is still good.    Past Medical History:  Diagnosis Date  . Allergic rhinitis, cause unspecified 09/08/2006  . Carpal tunnel syndrome 03/16/2009  . Chest pain, unspecified 08/20/2012  . Chest pain, unspecified 12/26/2011  . Concussion, unspecified 10/30/1994  . COPD (chronic obstructive pulmonary disease) ([place]) 02/24/2006  . Corns and callosities 04/11/2007  . Dermatophytosis of the body   . Disturbance of skin sensation   . Diverticulosis of colon (without mention of hemorrhage) 06/14/2010  . Edema of male genital organs 08/20/2012  . Elevated blood pressure reading without diagnosis of hypertension 06/14/2010  . Encounter for long-term (current) use of other medications   . Hypercholesterolemia   . Hypertrophy of prostate without urinary obstruction and  other lower urinary tract symptoms (LUTS)   . 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 Lumbago 05/11/2009  . Nocturia 08/20/2012  . Other and unspecified hyperlipidemia 02/24/2006  . Pain in joint, ankle and foot 04/11/2007  . Pterygium, unspecified 03/01/2006  . Rash and other nonspecific skin eruption 12/28/1999  . Seborrheic dermatitis, unspecified 03/21/2001  . 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 Tobacco use disorder   . Ulcer of other part of foot 03/16/2007  . Unspecified constipation 05/11/2009  . Urinary tract infection, site not specified 05/11/2009  . Ventral hernia, unspecified, without mention of obstruction or gangrene     Past Surgical History:  Procedure Laterality Date  . ELBOW SURGERY Right   . EYE SURGERY     Cataract Removal  . FRACTURE SURGERY  1986   right hip, right elbow  . KNEE SURGERY Right 01/2007   knee cap injury  . SKIN CANCER EXCISION  07/2014   Back and head   . TONSILLECTOMY  1948    Allergies  Allergen Reactions  . Zocor [Simvastatin] Shortness Of Breath    Sob and itching  . Ace Inhibitors Cough    Tolerating ARB instead  . Lipitor [Atorvastatin]     Muscle and bone pain  . Pravachol [Pravastatin Sodium] Other (See Comments)    myalgias    Outpatient Encounter Medications as of 04/16/2020  Medication Sig  .  aspirin 81 MG tablet Take 81 mg by mouth daily.   . Evolocumab 140 MG/ML SOAJ Inject 1 Dose into the skin every 14 (fourteen) days.  Marland Kitchen ezetimibe (ZETIA) 10 MG tablet Take 1 tablet (10 mg total) by mouth daily.  . nitroGLYCERIN (NITROSTAT) 0.4 MG SL tablet Place 1 tablet (0.4 mg total) under the tongue every 5 (five) minutes as needed for chest pain (max 3 doses).  Marland Kitchen omeprazole (PRILOSEC) 20 MG capsule Take 20 mg by mouth daily.  . tamsulosin (FLOMAX) 0.4 MG CAPS capsule TAKE 1 CAPSULE EVERY DAY  . valsartan (DIOVAN) 80 MG tablet Take 1 tablet by mouth daily.  . [DISCONTINUED]  ibuprofen (ADVIL,MOTRIN) 200 MG tablet Take 400 mg by mouth every 4 (four) hours as needed for fever, headache, mild pain, moderate pain or cramping.  . [DISCONTINUED] nitroGLYCERIN (NITROSTAT) 0.4 MG SL tablet Place 1 tablet (0.4 mg total) under the tongue every 5 (five) minutes as needed for chest pain (max 3 doses).   No facility-administered encounter medications on file as of 04/16/2020.    Review of Systems:  Review of Systems  Constitutional: Negative for chills, fever and malaise/fatigue.  HENT: Positive for hearing loss.   Eyes: Negative for blurred vision.  Respiratory: Positive for sputum production and shortness of breath. Negative for cough and wheezing.   Cardiovascular: Negative for chest pain.  Gastrointestinal: Negative for abdominal pain, blood in stool, constipation and melena.  Genitourinary: Negative for dysuria.  Musculoskeletal: Negative for falls.  Skin: Negative for itching and rash.  Neurological: Negative for dizziness and loss of consciousness.  Endo/Heme/Allergies: Does not bruise/bleed easily.  Psychiatric/Behavioral: Negative for depression. The patient is not nervous/anxious.     Health Maintenance  Topic Date Due  . Hepatitis C Screening  Never done  . TETANUS/TDAP  12/04/2023  . INFLUENZA VACCINE  Completed  . COVID-19 Vaccine  Completed  . PNA vac Low Risk Adult  Completed    Physical Exam: Vitals:   04/16/20 0946  BP: 130/78  Pulse: (!) 56  Temp: 97.8 F (36.6 C)  SpO2: 97%  Weight: 133 lb 3.2 oz (60.4 kg)  Height: 5\' 7"  (1.702 m)   Body mass index is 20.86 kg/m. Physical Exam Vitals reviewed.  Constitutional:      General: He is not in acute distress.    Appearance: Normal appearance. He is not toxic-appearing.  HENT:     Head: Normocephalic and atraumatic.  Eyes:     Extraocular Movements: Extraocular movements intact.     Pupils: Pupils are equal, round, and reactive to light.  Cardiovascular:     Rate and Rhythm: Normal  rate and regular rhythm.  Pulmonary:     Effort: Pulmonary effort is normal.     Breath sounds: Normal breath sounds. No wheezing, rhonchi or rales.  Abdominal:     General: Bowel sounds are normal.  Musculoskeletal:        General: Normal range of motion.     Right lower leg: No edema.     Left lower leg: No edema.  Neurological:     General: No focal deficit present.     Mental Status: He is alert and oriented to person, place, and time.  Psychiatric:        Mood and Affect: Mood normal.        Behavior: Behavior normal.     Labs reviewed: Basic Metabolic Panel: Recent Labs    05/23/19 0832 10/10/19 0825 11/25/19 1229  NA 139 135  135  K 4.6 4.6 4.8  CL 104 102 103  CO2 24 27 25   GLUCOSE 95 96 92  BUN 14 14 17   CREATININE 1.40* 1.47* 1.48*  CALCIUM 8.9 9.4 8.7  TSH  --   --  2.93   Liver Function Tests: Recent Labs    05/23/19 0832 10/10/19 0825  AST 17 18  ALT 8* 8*  BILITOT 0.4 0.6  PROT 6.6 6.8   No results for input(s): LIPASE, AMYLASE in the last 8760 hours. No results for input(s): AMMONIA in the last 8760 hours. CBC: Recent Labs    05/23/19 0832 10/10/19 0825 11/25/19 1229  WBC 4.4 4.5 5.3  NEUTROABS 2,763 2,637 3,599  HGB 12.7* 13.1* 12.7*  HCT 39.7 41.0 40.0  MCV 82.2 85.1 84.0  PLT 234 220 232   Lipid Panel: Recent Labs    05/23/19 0832 10/10/19 0825 01/21/20 0907  CHOL 269* 192 138  HDL 50 56 52  LDLCALC 192* 117* 69  TRIG 135 86 91  CHOLHDL 5.4* 3.4 2.7   Lab Results  Component Value Date   HGBA1C 5.8 (H) 10/10/2019    Assessment/Plan 1. Essential hypertension -bp at goal, cont same regimen and monitor - CBC with Differential/Platelet - Basic metabolic panel - Hepatic function panel  2. COPD mixed type ([place]) -has chronic cough and sputum and this may be part of his dyspnea on exertion, did have cardiac workup -cont to stay active  3. Stage 3a chronic kidney disease (HCC) - Avoid nephrotoxic agents like nsaids, dose  adjust renally excreted meds, hydrate. - Basic metabolic panel  4. Hyperglycemia - counseled on healthy eating habits, he is quite active outside and doing chores, etc though he does not do any deliberate exercise - Basic metabolic panel  5. Hypercholesterolemia -did not recheck lipids b/c we know they will have gone way up off his injectable -cont zetia and he will get back on his injectable when covered again in the new year--he had great results  6. Encounter for hepatitis C screening test for low risk patient - Hepatitis C antibody   Labs/tests ordered:  * No order type specified * Next appt:  10/09/2020   Shyler Holzman L. Xavian Hardcastle, D.O. [facility] [address], [place] [address] Cell Phone (Mon-Fri 8am-5pm):  [phone] On Call:  [phone] & follow prompts after 5pm & weekends Office Phone:  [phone] Office Fax:  [phone]

## 2020-04-17 LAB — HEPATIC FUNCTION PANEL
AG Ratio: 1.4 (calc) (ref 1.0–2.5)
ALT: 9 U/L (ref 9–46)
AST: 20 U/L (ref 10–35)
Albumin: 4 g/dL (ref 3.6–5.1)
Alkaline phosphatase (APISO): 59 U/L (ref 35–144)
Bilirubin, Direct: 0.1 mg/dL (ref 0.0–0.2)
Globulin: 2.8 g/dL (calc) (ref 1.9–3.7)
Indirect Bilirubin: 0.4 mg/dL (calc) (ref 0.2–1.2)
Total Bilirubin: 0.5 mg/dL (ref 0.2–1.2)
Total Protein: 6.8 g/dL (ref 6.1–8.1)

## 2020-04-17 LAB — CBC WITH DIFFERENTIAL/PLATELET
Absolute Monocytes: 473 cells/uL (ref 200–950)
Basophils Absolute: 23 cells/uL (ref 0–200)
Basophils Relative: 0.4 %
Eosinophils Absolute: 108 cells/uL (ref 15–500)
Eosinophils Relative: 1.9 %
HCT: 40.4 % (ref 38.5–50.0)
Hemoglobin: 13.6 g/dL (ref 13.2–17.1)
Lymphs Abs: 929 cells/uL (ref 850–3900)
MCH: 28.3 pg (ref 27.0–33.0)
MCHC: 33.7 g/dL (ref 32.0–36.0)
MCV: 84 fL (ref 80.0–100.0)
MPV: 10.1 fL (ref 7.5–12.5)
Monocytes Relative: 8.3 %
Neutro Abs: 4167 cells/uL (ref 1500–7800)
Neutrophils Relative %: 73.1 %
Platelets: 227 10*3/uL (ref 140–400)
RBC: 4.81 10*6/uL (ref 4.20–5.80)
RDW: 13.7 % (ref 11.0–15.0)
Total Lymphocyte: 16.3 %
WBC: 5.7 10*3/uL (ref 3.8–10.8)

## 2020-04-17 LAB — HEPATITIS C ANTIBODY
Hepatitis C Ab: NONREACTIVE
SIGNAL TO CUT-OFF: 0.01 (ref <1.00)

## 2020-04-17 LAB — BASIC METABOLIC PANEL
BUN/Creatinine Ratio: 8 (calc) (ref 6–22)
BUN: 11 mg/dL (ref 7–25)
CO2: 26 mmol/L (ref 20–32)
Calcium: 9.2 mg/dL (ref 8.6–10.3)
Chloride: 98 mmol/L (ref 98–110)
Creat: 1.37 mg/dL — ABNORMAL HIGH (ref 0.70–1.18)
Glucose, Bld: 91 mg/dL (ref 65–99)
Potassium: 4.6 mmol/L (ref 3.5–5.3)
Sodium: 131 mmol/L — ABNORMAL LOW (ref 135–146)

## 2020-04-20 NOTE — Progress Notes (Signed)
Blood counts are now normal. Kidneys are stable. Sodium was slightly low.  This can come from inadequate hydration. Liver tests are normal Hepatitis C screen is negative.  For some reason though it's resulted, it still says expected at the bottom so I was waiting for it for days when it may have already been back.

## 2020-05-19 ENCOUNTER — Telehealth: Payer: Self-pay | Admitting: Internal Medicine

## 2020-05-19 NOTE — Telephone Encounter (Signed)
PA for repatha sureclick submitted via CMM  (Key: [id number])

## 2020-05-19 NOTE — Telephone Encounter (Signed)
PA Case: [id number], Status: Approved, Coverage Starts on: 06/07/2019 12:00:00 AM, Coverage Ends on: 06/05/2021 12:00:00 AM

## 2020-07-27 ENCOUNTER — Encounter: Payer: Self-pay | Admitting: Internal Medicine

## 2020-08-05 ENCOUNTER — Encounter: Payer: Self-pay | Admitting: *Deleted

## 2020-08-10 ENCOUNTER — Other Ambulatory Visit: Payer: Medicare HMO

## 2020-08-10 ENCOUNTER — Other Ambulatory Visit: Payer: Self-pay

## 2020-08-10 DIAGNOSIS — Z1159 Encounter for screening for other viral diseases: Secondary | ICD-10-CM | POA: Diagnosis not present

## 2020-08-10 LAB — HEPATITIS C ANTIBODY
Hepatitis C Ab: NONREACTIVE
SIGNAL TO CUT-OFF: 0.01 (ref <1.00)

## 2020-08-11 NOTE — Progress Notes (Signed)
Hepatitis C screen was negative.

## 2020-08-13 ENCOUNTER — Other Ambulatory Visit: Payer: Self-pay

## 2020-08-13 ENCOUNTER — Encounter: Payer: Self-pay | Admitting: Internal Medicine

## 2020-08-13 ENCOUNTER — Ambulatory Visit (INDEPENDENT_AMBULATORY_CARE_PROVIDER_SITE_OTHER): Payer: Medicare HMO | Admitting: Internal Medicine

## 2020-08-13 VITALS — BP 130/78 | HR 55 | Temp 96.6°F | Ht 67.0 in | Wt 135.0 lb

## 2020-08-13 DIAGNOSIS — I10 Essential (primary) hypertension: Secondary | ICD-10-CM | POA: Diagnosis not present

## 2020-08-13 DIAGNOSIS — J449 Chronic obstructive pulmonary disease, unspecified: Secondary | ICD-10-CM

## 2020-08-13 DIAGNOSIS — N1831 Chronic kidney disease, stage 3a: Secondary | ICD-10-CM | POA: Diagnosis not present

## 2020-08-13 DIAGNOSIS — R739 Hyperglycemia, unspecified: Secondary | ICD-10-CM

## 2020-08-13 DIAGNOSIS — E7800 Pure hypercholesterolemia, unspecified: Secondary | ICD-10-CM | POA: Diagnosis not present

## 2020-08-13 NOTE — Progress Notes (Signed)
Location:  [facility] clinic Provider:  Reigan Tolliver L. Mariea Clonts, D.O., C.M.D.  [place] CODE Goals of Care:  Advanced Directives 08/13/2020  Does Patient Have a Medical Advance Directive? Yes  Type of Advance Directive Living will  Does patient want to make changes to medical advance directive? No - Patient declined  Copy of [place] in Chart? -  Would patient like information on creating a medical advance directive? -     Chief Complaint  Patient presents with  . Medical Management of Chronic Issues    4 month follow-up. Discuss need for covid booster. Patient not using injection for high cholesterol due to cost of medication.     HPI: Patient is a 80 y.o. male seen today for medical management of chronic diseases.    Dr. Debara Pickett has gotten repatha authorized for his insurance.  He is taking the zetia but not the repatha due to cost.  I don't see any calls about him being notified about the authorized med.    Some days, he has trouble breathing.  He will feel like "there ain't no air" but better with rest.  He stays active.  He had quit using his albuterol and told someone that so it got taken off his med list.  Recommended he have his own albuterol again, but he declines.  Also recommended carrying the ntg with him so if he has a chest pain.    Notes his bowels move better if he does drink more water.   Left hand is numb in mornings.  Has had neck arthritis.  Does go away when he moves around and shakes it off.    Past Medical History:  Diagnosis Date  . Allergic rhinitis, cause unspecified 09/08/2006  . Carpal tunnel syndrome 03/16/2009  . Chest pain, unspecified 08/20/2012  . Chest pain, unspecified 12/26/2011  . Concussion, unspecified 10/30/1994  . COPD (chronic obstructive pulmonary disease) ([place]) 02/24/2006  . Corns and callosities 04/11/2007  . Dermatophytosis of the body   . Disturbance of skin sensation   . Diverticulosis of colon (without mention of hemorrhage)  06/14/2010  . Edema of male genital organs 08/20/2012  . Elevated blood pressure reading without diagnosis of hypertension 06/14/2010  . Encounter for long-term (current) use of other medications   . Hypercholesterolemia   . Hypertrophy of prostate without urinary obstruction and other lower urinary tract symptoms (LUTS)   . 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 Lumbago 05/11/2009  . Nocturia 08/20/2012  . Other and unspecified hyperlipidemia 02/24/2006  . Pain in joint, ankle and foot 04/11/2007  . Pterygium, unspecified 03/01/2006  . Rash and other nonspecific skin eruption 12/28/1999  . Seborrheic dermatitis, unspecified 03/21/2001  . 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 Tobacco use disorder   . Ulcer of other part of foot 03/16/2007  . Unspecified constipation 05/11/2009  . Urinary tract infection, site not specified 05/11/2009  . Ventral hernia, unspecified, without mention of obstruction or gangrene     Past Surgical History:  Procedure Laterality Date  . ELBOW SURGERY Right   . EYE SURGERY     Cataract Removal  . FRACTURE SURGERY  1986   right hip, right elbow  . KNEE SURGERY Right 01/2007   knee cap injury  . SKIN CANCER EXCISION  07/2014   Back and head   . TONSILLECTOMY  1948    Allergies  Allergen Reactions  . Zocor [Simvastatin] Shortness Of Breath  Sob and itching  . Ace Inhibitors Cough    Tolerating ARB instead  . Lipitor [Atorvastatin]     Muscle and bone pain  . Pravachol [Pravastatin Sodium] Other (See Comments)    myalgias    Outpatient Encounter Medications as of 08/13/2020  Medication Sig  . aspirin 81 MG tablet Take 81 mg by mouth daily.   Marland Kitchen ezetimibe (ZETIA) 10 MG tablet Take 1 tablet (10 mg total) by mouth daily.  . nitroGLYCERIN (NITROSTAT) 0.4 MG SL tablet Place 1 tablet (0.4 mg total) under the tongue every 5 (five) minutes as needed for chest pain (max 3 doses).  Marland Kitchen omeprazole (PRILOSEC)  20 MG capsule Take 20 mg by mouth daily.  . tamsulosin (FLOMAX) 0.4 MG CAPS capsule TAKE 1 CAPSULE EVERY DAY  . valsartan (DIOVAN) 80 MG tablet Take 1 tablet by mouth daily.  . [DISCONTINUED] Evolocumab 140 MG/ML SOAJ Inject 1 Dose into the skin every 14 (fourteen) days.   No facility-administered encounter medications on file as of 08/13/2020.    Review of Systems:  Review of Systems  Constitutional: Negative for chills, fever and malaise/fatigue.  HENT: Positive for hearing loss. Negative for congestion and sore throat.   Eyes: Negative for blurred vision.  Respiratory: Positive for shortness of breath (on exertion like up hill--had evaluated). Negative for cough and sputum production.   Cardiovascular: Negative for chest pain, palpitations and leg swelling.  Gastrointestinal: Negative for abdominal pain, blood in stool and melena.  Genitourinary: Negative for dysuria.  Musculoskeletal: Positive for joint pain. Negative for falls.  Skin: Negative for rash.  Neurological: Negative for dizziness, loss of consciousness and weakness.  Endo/Heme/Allergies: Bruises/bleeds easily.  Psychiatric/Behavioral: Negative for depression. The patient is not nervous/anxious.     Health Maintenance  Topic Date Due  . COVID-19 Vaccine (3 - Pfizer risk 4-dose series) 12/04/2019  . TETANUS/TDAP  12/04/2023  . INFLUENZA VACCINE  Completed  . PNA vac Low Risk Adult  Completed  . HPV VACCINES  Aged Out    Physical Exam: Vitals:   08/13/20 0933  BP: 130/78  Pulse: (!) 55  Temp: (!) 96.6 F (35.9 C)  TempSrc: Temporal  SpO2: 97%  Weight: 135 lb (61.2 kg)  Height: 5\' 7"  (1.702 m)   Body mass index is 21.14 kg/m. Physical Exam Vitals reviewed.  Constitutional:      Appearance: Normal appearance.  HENT:     Head: Normocephalic and atraumatic.  Cardiovascular:     Rate and Rhythm: Normal rate and regular rhythm.     Pulses: Normal pulses.     Heart sounds: Normal heart sounds.  Pulmonary:      Effort: Pulmonary effort is normal.     Breath sounds: Normal breath sounds. No wheezing, rhonchi or rales.  Abdominal:     General: Bowel sounds are normal.  Musculoskeletal:        General: Normal range of motion.     Right lower leg: No edema.     Left lower leg: No edema.  Skin:    General: Skin is warm and dry.  Neurological:     General: No focal deficit present.     Mental Status: He is alert and oriented to person, place, and time.     Labs reviewed: Basic Metabolic Panel: Recent Labs    10/10/19 0825 11/25/19 1229 04/16/20 1046  NA 135 135 131*  K 4.6 4.8 4.6  CL 102 103 98  CO2 27 25 26   GLUCOSE 96 92  91  BUN 14 17 11   CREATININE 1.47* 1.48* 1.37*  CALCIUM 9.4 8.7 9.2  TSH  --  2.93  --    Liver Function Tests: Recent Labs    10/10/19 0825 04/16/20 1046  AST 18 20  ALT 8* 9  BILITOT 0.6 0.5  PROT 6.8 6.8   No results for input(s): LIPASE, AMYLASE in the last 8760 hours. No results for input(s): AMMONIA in the last 8760 hours. CBC: Recent Labs    10/10/19 0825 11/25/19 1229 04/16/20 1046  WBC 4.5 5.3 5.7  NEUTROABS 2,637 3,599 4,167  HGB 13.1* 12.7* 13.6  HCT 41.0 40.0 40.4  MCV 85.1 84.0 84.0  PLT 220 232 227   Lipid Panel: Recent Labs    10/10/19 0825 01/21/20 0907  CHOL 192 138  HDL 56 52  LDLCALC 117* 69  TRIG 86 91  CHOLHDL 3.4 2.7   Lab Results  Component Value Date   HGBA1C 5.8 (H) 10/10/2019    Procedures since last visit: No results found.  Assessment/Plan 1. Essential hypertension -bp right at goal -cont current regimen and monitor - BASIC METABOLIC PANEL WITH GFR; Future - CBC with Differential/Platelet; Future  2. COPD mixed type ([place]) -ongoing, does not use albuterol inhaler prn as recommended and declined even having one ordered for him  3. Stage 3a chronic kidney disease (HCC) Avoid nephrotoxic agents like nsaids, dose adjust renally excreted meds, hydrate. - BASIC METABOLIC PANEL WITH GFR;  Future  4. Hyperglycemia - educated every visit about healthy diet Lab Results  Component Value Date   HGBA1C 5.8 (H) 10/10/2019  - Hemoglobin A1c; Future  5. Hypercholesterolemia - will cc: Dr. Debara Pickett and his nurse, Jenna--pt reports he was never notified that his repatha was re-authorized--tells me it was too expensive last year so he didn't get any--timing of that not clear to me--he's not been using it -continues zetia  - Lipid panel; Future  Labs/tests ordered:   Lab Orders     BASIC METABOLIC PANEL WITH GFR     CBC with Differential/Platelet     Lipid panel     Hemoglobin A1c  Next appt:  July with [name], fasting labs before, also has AWV scheduled  Rylyn Zawistowski L. Lamar Meter, D.O. [facility] [address], [place] [address] Cell Phone (Mon-Fri 8am-5pm):  [phone] On Call:  [phone] & follow prompts after 5pm & weekends Office Phone:  [phone] Office Fax:  [phone]

## 2020-08-13 NOTE — Patient Instructions (Signed)
I recommend you get your covid booster done.

## 2020-08-17 ENCOUNTER — Telehealth: Payer: Self-pay | Admitting: Internal Medicine

## 2020-08-17 MED ORDER — REPATHA SURECLICK 140 MG/ML ~~LOC~~ SOAJ: 1.0000 | SUBCUTANEOUS | 11 refills | Status: DC

## 2020-08-17 NOTE — Telephone Encounter (Signed)
-----   Message -----  From: Gayland Curry, DO  Sent: 08/16/2020  9:39 AM EDT  To: Pixie Casino, MD, Fidel Levy, RN   Pt reports not being notified that repatha was re-authorized (looks like that was in Dec). Recalls it being too expensive last year so not sure if that was at that time. He's not using it now, only zetia and LDL going back up.  Thanks,  [employer]

## 2020-08-17 NOTE — Telephone Encounter (Signed)
Spoke with patient's wife. They received letter mailed on 08/05/20 explaining how to apply for Health Well grant. Wife states their daughter has applied for patient, he has been approved and has letter. Advised will send Rx to [facility] and how to obtain Rx with co-pay grant. She voiced understanding and was appreciative of call

## 2020-08-18 NOTE — Telephone Encounter (Signed)
Health Well ID # [id number] PC Group: [id number] PC BIN: [id number] PC PCN: [id number] PC Processor: PDMI  Effective dates: 07/18/2020 - 07/17/2021

## 2020-08-27 ENCOUNTER — Other Ambulatory Visit: Payer: Self-pay | Admitting: Internal Medicine

## 2020-10-09 ENCOUNTER — Encounter: Payer: Medicare HMO | Admitting: Family

## 2020-10-22 ENCOUNTER — Encounter: Payer: Medicare HMO | Admitting: Family

## 2020-10-28 ENCOUNTER — Other Ambulatory Visit: Payer: Self-pay | Admitting: *Deleted

## 2020-10-28 MED ORDER — OMEPRAZOLE 20 MG PO CPDR: 20.0000 mg | DELAYED_RELEASE_CAPSULE | Freq: Every day | ORAL | 1 refills | Status: DC

## 2020-10-28 MED ORDER — TAMSULOSIN HCL 0.4 MG PO CAPS: 0.4000 mg | ORAL_CAPSULE | Freq: Every day | ORAL | 1 refills | Status: DC

## 2020-10-28 NOTE — Telephone Encounter (Signed)
[facility] 

## 2020-11-18 ENCOUNTER — Encounter: Payer: Self-pay | Admitting: Family

## 2020-11-18 ENCOUNTER — Ambulatory Visit (INDEPENDENT_AMBULATORY_CARE_PROVIDER_SITE_OTHER): Payer: Medicare HMO | Admitting: Family

## 2020-11-18 ENCOUNTER — Other Ambulatory Visit: Payer: Self-pay

## 2020-11-18 DIAGNOSIS — Z0000 Encounter for general adult medical examination without abnormal findings: Secondary | ICD-10-CM

## 2020-11-18 NOTE — Progress Notes (Signed)
This service is provided via telemedicine  No vital signs collected/recorded due to the encounter was a telemedicine visit.   Location of patient (ex: home, work): Home.  Patient consents to a telephone visit: Yes.  Location of the provider (ex: office, home): [employer].  Name of any referring provider: Fatima Fedie, Nelda Bucks, NP   Names of all persons participating in the telemedicine service and their role in the encounter: Patient, Heriberto Antigua, [place], [place], Webb Silversmith, NP.    Time spent on call: 8 minutes spent on the phone with Medical Assistant.      Subjective:   Thomas Reyes is a 80 y.o. male who presents for Medicare Annual/Subsequent preventive examination.  Review of Systems     Cardiac Risk Factors include: advanced age (>57men, >63 women);hypertension;male gender;smoking/ tobacco exposure     Objective:    There were no vitals filed for this visit. There is no height or weight on file to calculate BMI.  Advanced Directives 11/18/2020 08/13/2020 04/16/2020 11/25/2019 10/14/2019 10/08/2019 10/05/2018  Does Patient Have a Medical Advance Directive? Yes Yes Yes Yes Yes Yes Yes  Type of Paramedic of [place];Living will Living will Out of facility DNR (pink MOST or yellow form);Living will Living will Living will Living will Living will  Does patient want to make changes to medical advance directive? No - Patient declined No - Patient declined No - Patient declined No - Patient declined No - Patient declined No - Patient declined No - Patient declined  Copy of [place] in Chart? Yes - validated most recent copy scanned in chart (See row information) - - - - - -  Would patient like information on creating a medical advance directive? - - - - - - No - Patient declined    Current Medications (verified) Outpatient Encounter Medications as of 11/18/2020  Medication Sig   aspirin 81 MG tablet Take 81 mg by mouth  daily.    Evolocumab (REPATHA SURECLICK) 643 MG/ML SOAJ Inject 1 Dose into the skin every 14 (fourteen) days.   ezetimibe (ZETIA) 10 MG tablet Take 1 tablet (10 mg total) by mouth daily.   nitroGLYCERIN (NITROSTAT) 0.4 MG SL tablet Place 1 tablet (0.4 mg total) under the tongue every 5 (five) minutes as needed for chest pain (max 3 doses).   omeprazole (PRILOSEC) 20 MG capsule Take 1 capsule (20 mg total) by mouth daily.   tamsulosin (FLOMAX) 0.4 MG CAPS capsule Take 1 capsule (0.4 mg total) by mouth daily.   valsartan (DIOVAN) 80 MG tablet TAKE 1 TABLET EVERY DAY   No facility-administered encounter medications on file as of 11/18/2020.    Allergies (verified) Zocor [simvastatin], Ace inhibitors, Lipitor [atorvastatin], and Pravachol [pravastatin sodium]   History: 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   Chest pain, unspecified 08/20/2012   Chest pain, unspecified 12/26/2011   Concussion, unspecified 10/30/1994   COPD (chronic obstructive pulmonary disease) ([place]) 02/24/2006   Corns and callosities 04/11/2007   Dermatophytosis of the body    Disturbance of skin sensation    Diverticulosis of colon (without mention of hemorrhage) 06/14/2010   Edema of male genital organs 08/20/2012   Elevated blood pressure reading without diagnosis of hypertension 06/14/2010   Encounter for long-term (current) use of other medications    Hypercholesterolemia    Hypertrophy of prostate without urinary obstruction and other lower urinary tract symptoms (  LUTS)    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   Lumbago 05/11/2009   Nocturia 08/20/2012   Other and unspecified hyperlipidemia 02/24/2006   Pain in joint, ankle and foot 04/11/2007   Pterygium, unspecified 03/01/2006   Rash and other nonspecific skin eruption 12/28/1999   Seborrheic dermatitis, unspecified 03/21/2001   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 12/26/2011   Tobacco use disorder    Ulcer of other part of foot 03/16/2007   Unspecified constipation 05/11/2009   Urinary tract infection, site not specified 05/11/2009   Ventral hernia, unspecified, without mention of obstruction or gangrene    Past Surgical History:  Procedure Laterality Date   ELBOW SURGERY Right    EYE SURGERY     Cataract Removal   FRACTURE SURGERY  1986   right hip, right elbow   KNEE SURGERY Right 01/2007   knee cap injury   SKIN CANCER EXCISION  07/2014   Back and head    TONSILLECTOMY  1948   Family History  Problem Relation Age of Onset   Diabetes Mother    Hypertension Mother    Cancer Father    Social History   Socioeconomic History   Marital status: Married    Spouse name: Not on file   Number of children: Not on file   Years of education: Not on file   Highest education level: Not on file  Occupational History   Not on file  Tobacco Use   Smoking status: Former    Pack years: 0.00    Types: Cigarettes    Quit date: 06/09/2010    Years since quitting: 10.4   Smokeless tobacco: Never  Vaping Use   Vaping Use: Never used  Substance and Sexual Activity   Alcohol use: No    Alcohol/week: 0.0 standard drinks   Drug use: No   Sexual activity: Not Currently  Other Topics Concern   Not on file  Social History Narrative   Not on file   Social Determinants of Health   Financial Resource Strain: Not on file  Food Insecurity: Not on file  Transportation Needs: Not on file  Physical Activity: Not on file  Stress: Not on file  Social Connections: Not on file    Tobacco Counseling Counseling given: Not Answered   Clinical Intake:  Pre-visit preparation completed: No  Pain : No/denies pain     BMI - recorded: 21.14 Nutritional Status: BMI of 19-24  Normal Nutritional Risks: None Diabetes: No  How often do you need to have someone help you when you read instructions, pamphlets, or other written  materials from your doctor or pharmacy?: 1 - Never What is the last grade level you completed in school?: 12 Grade  Diabetic? No   Interpreter Needed?: No  Information entered by :: Bader Stubblefield,FNP-C   Activities of Daily Living In your present state of health, do you have any difficulty performing the following activities: 11/18/2020  Hearing? Y  Vision? N  Difficulty concentrating or making decisions? N  Walking or climbing stairs? N  Dressing or bathing? N  Doing errands, shopping? N  Preparing Food and eating ? N  Using the Toilet? N  In the past six months, have you accidently leaked urine? N  Do you have problems with loss of bowel control? N  Managing your Medications? N  Managing your Finances? N  Housekeeping or managing your Housekeeping? N  Some recent data might be hidden  Patient Care Team: Cheyne Boulden, Nelda Bucks, NP as PCP - General (Family Medicine) Warden Fillers, MD as Consulting Physician (Ophthalmology)  Indicate any recent Medical Services you may have received from other than [facility] providers in the past year (date may be approximate).     Assessment:   This is a routine wellness examination for [place].  Hearing/Vision screen Hearing Screening - Comments:: No Hearing Concerns. Vision Screening - Comments:: No Vision Concerns. Last eye exam was 2021.Patient wears reading glasses.   Dietary issues and exercise activities discussed: Current Exercise Habits: Home exercise routine, Type of exercise: Other - see comments (glove), Time (Minutes): > 60, Frequency (Times/Week): 2, Weekly Exercise (Minutes/Week): 0, Intensity: Mild, Exercise limited by: None identified   Goals Addressed             This Visit's Progress    <enter goal here>   On track    Starting 06/27/16, I will maintain my current lifestyle.         Depression Screen PHQ 2/9 Scores 11/18/2020 04/16/2020 10/14/2019 10/08/2019 05/27/2019 11/19/2018 10/05/2018  PHQ - 2 Score 0 0 0 0 0 0  0    Fall Risk Fall Risk  11/18/2020 04/16/2020 11/25/2019 10/14/2019 10/08/2019  Falls in the past year? 0 0 0 0 0  Number falls in past yr: 0 0 0 0 0  Injury with Fall? 0 0 0 0 0    FALL RISK PREVENTION PERTAINING TO THE HOME:  Any stairs in or around the home? No  If so, are there any without handrails? No  Home free of loose throw rugs in walkways, pet beds, electrical cords, etc? No  Adequate lighting in your home to reduce risk of falls? Yes   ASSISTIVE DEVICES UTILIZED TO PREVENT FALLS:  Life alert? No  Use of a cane, walker or w/c? No  Grab bars in the bathroom? No  Shower chair or bench in shower? No  Elevated toilet seat or a handicapped toilet? No   TIMED UP AND GO:  Was the test performed? No .  Length of time to ambulate 10 feet: N/A sec.   Gait slow and steady without use of assistive device  Cognitive Function: MMSE - Mini Mental State Exam 09/04/2017 06/27/2016 04/03/2015  Orientation to time 5 5 4   Orientation to Place 5 5 5   Registration 3 3 3   Attention/ Calculation 4 4 5   Recall 1 1 2   Language- name 2 objects 2 2 2   Language- repeat 1 1 1   Language- follow 3 step command 3 3 3   Language- read & follow direction 1 1 1   Write a sentence 1 1 1   Copy design 1 0 1  Total score 27 26 28      6CIT Screen 11/18/2020 10/08/2019 10/05/2018  What Year? 0 points 0 points 0 points  What month? 0 points 0 points 0 points  What time? 0 points 0 points 0 points  Count back from 20 0 points 0 points 0 points  Months in reverse 2 points 0 points 0 points  Repeat phrase 0 points 4 points 0 points  Total Score 2 4 0    Immunizations Immunization History  Administered Date(s) Administered   Fluad Quad(high Dose 65+) 05/27/2019, 03/05/2020   Influenza, High Dose Seasonal PF 03/09/2017, 05/21/2018   Influenza,inj,Quad PF,6+ Mos 03/31/2014, 04/03/2015, 02/25/2016   PFIZER(Purple Top)SARS-COV-2 Vaccination 07/21/2019, 11/06/2019   Pneumococcal Conjugate-13 07/18/2013    Pneumococcal Polysaccharide-23 10/02/2014   Tdap 06/10/2005, 12/03/2013  TDAP status: Up to date  Flu Vaccine status: Up to date  Pneumococcal vaccine status: Up to date  Covid-19 vaccine status: Information provided on how to obtain vaccines.   Qualifies for Shingles Vaccine? Yes   Zostavax completed No   Shingrix Completed?: No.    Education has been provided regarding the importance of this vaccine. Patient has been advised to call insurance company to determine out of pocket expense if they have not yet received this vaccine. Advised may also receive vaccine at local pharmacy or Health Dept. Verbalized acceptance and understanding.  Screening Tests Health Maintenance  Topic Date Due   Zoster Vaccines- Shingrix (1 of 2) Never done   COVID-19 Vaccine (3 - Pfizer risk series) 12/04/2019   INFLUENZA VACCINE  01/04/2021   TETANUS/TDAP  12/04/2023   PNA vac Low Risk Adult  Completed   HPV VACCINES  Aged Out    Health Maintenance  Health Maintenance Due  Topic Date Due   Zoster Vaccines- Shingrix (1 of 2) Never done   COVID-19 Vaccine (3 - Pfizer risk series) 12/04/2019    Colorectal cancer screening: No longer required.   Lung Cancer Screening: (Low Dose CT Chest recommended if Age 54-80 years, 30 pack-year currently smoking OR have quit w/in 15years.) does not qualify. Has had CT scan told no longer required   Lung Cancer Screening Referral:no   Additional Screening:  Hepatitis C Screening: does not qualify; Completed No   Vision Screening: Recommended annual ophthalmology exams for early detection of glaucoma and other disorders of the eye. Is the patient up to date with their annual eye exam?  Yes  Who is the provider or what is the name of the office in which the patient attends annual eye exams? Dr.Groat If pt is not established with a provider, would they like to be referred to a provider to establish care? No .   Dental Screening: Recommended annual dental  exams for proper oral hygiene  Community Resource Referral / Chronic Care Management: CRR required this visit?  No   CCM required this visit?  No      Plan:   - decline shingrix vaccine due to cost  - Advised to get COVID-19 booster vaccine at the pharmacy   I have personally reviewed and noted the following in the patient's chart:   Medical and social history Use of alcohol, tobacco or illicit drugs  Current medications and supplements including opioid prescriptions. Patient is not currently taking opioid prescriptions. Functional ability and status Nutritional status Physical activity Advanced directives List of other physicians Hospitalizations, surgeries, and ER visits in previous 12 months Vitals Screenings to include cognitive, depression, and falls Referrals and appointments  In addition, I have reviewed and discussed with patient certain preventive protocols, quality metrics, and best practice recommendations. A written personalized care plan for preventive services as well as general preventive health recommendations were provided to patient.     Sandrea Hughs, NP   11/18/2020   Nurse Notes:   - decline shingrix vaccine due to cost  - Advised to get COVID-19 booster vaccine at the pharmacy

## 2020-11-18 NOTE — Patient Instructions (Signed)
Mr. Tomasik, Thank you for taking time to come for your Medicare Wellness Visit. I appreciate your ongoing commitment to your health goals. Please review the following plan we discussed and let me know if I can assist you in the future.   Screening recommendations/referrals: Colonoscopy N/A Recommended yearly ophthalmology/optometry visit for glaucoma screening and checkup Recommended yearly dental visit for hygiene and checkup  Vaccinations: Influenza vaccine Up to date Pneumococcal vaccine Up to date Tdap vaccine Up to date Shingles vaccine decline   Advanced directives: Yes  Conditions/risks identified: Advance age male > 75 yrs,Hypertension,male Gender,Hx of smoking   Next appointment: 1 year   Preventive Care 67 Years and Older, Male Preventive care refers to lifestyle choices and visits with your health care provider that can promote health and wellness. What does preventive care include? A yearly physical exam. This is also called an annual well check. Dental exams once or twice a year. Routine eye exams. Ask your health care provider how often you should have your eyes checked. Personal lifestyle choices, including: Daily care of your teeth and gums. Regular physical activity. Eating a healthy diet. Avoiding tobacco and drug use. Limiting alcohol use. Practicing safe sex. Taking low doses of aspirin every day. Taking vitamin and mineral supplements as recommended by your health care provider. What happens during an annual well check? The services and screenings done by your health care provider during your annual well check will depend on your age, overall health, lifestyle risk factors, and family history of disease. Counseling  Your health care provider may ask you questions about your: Alcohol use. Tobacco use. Drug use. Emotional well-being. Home and relationship well-being. Sexual activity. Eating habits. History of falls. Memory and ability to understand  (cognition). Work and work Statistician. Screening  You may have the following tests or measurements: Height, weight, and BMI. Blood pressure. Lipid and cholesterol levels. These may be checked every 5 years, or more frequently if you are over 81 years old. Skin check. Lung cancer screening. You may have this screening every year starting at age 22 if you have a 30-pack-year history of smoking and currently smoke or have quit within the past 15 years. Fecal occult blood test (FOBT) of the stool. You may have this test every year starting at age 35. Flexible sigmoidoscopy or colonoscopy. You may have a sigmoidoscopy every 5 years or a colonoscopy every 10 years starting at age 21. Prostate cancer screening. Recommendations will vary depending on your family history and other risks. Hepatitis C blood test. Hepatitis B blood test. Sexually transmitted disease (STD) testing. Diabetes screening. This is done by checking your blood sugar (glucose) after you have not eaten for a while (fasting). You may have this done every 1-3 years. Abdominal aortic aneurysm (AAA) screening. You may need this if you are a current or former smoker. Osteoporosis. You may be screened starting at age 61 if you are at high risk. Talk with your health care provider about your test results, treatment options, and if necessary, the need for more tests. Vaccines  Your health care provider may recommend certain vaccines, such as: Influenza vaccine. This is recommended every year. Tetanus, diphtheria, and acellular pertussis (Tdap, Td) vaccine. You may need a Td booster every 10 years. Zoster vaccine. You may need this after age 55. Pneumococcal 13-valent conjugate (PCV13) vaccine. One dose is recommended after age 27. Pneumococcal polysaccharide (PPSV23) vaccine. One dose is recommended after age 77. Talk to your health care provider about which screenings  and vaccines you need and how often you need them. This  information is not intended to replace advice given to you by your health care provider. Make sure you discuss any questions you have with your health care provider. Document Released: 06/19/2015 Document Revised: 02/10/2016 Document Reviewed: 03/24/2015 Elsevier Interactive Patient Education  2017 [place] Prevention in the Home Falls can cause injuries. They can happen to people of all ages. There are many things you can do to make your home safe and to help prevent falls. What can I do on the outside of my home? Regularly fix the edges of walkways and driveways and fix any cracks. Remove anything that might make you trip as you walk through a door, such as a raised step or threshold. Trim any bushes or trees on the path to your home. Use bright outdoor lighting. Clear any walking paths of anything that might make someone trip, such as rocks or tools. Regularly check to see if handrails are loose or broken. Make sure that both sides of any steps have handrails. Any raised decks and porches should have guardrails on the edges. Have any leaves, snow, or ice cleared regularly. Use sand or salt on walking paths during winter. Clean up any spills in your garage right away. This includes oil or grease spills. What can I do in the bathroom? Use night lights. Install grab bars by the toilet and in the tub and shower. Do not use towel bars as grab bars. Use non-skid mats or decals in the tub or shower. If you need to sit down in the shower, use a plastic, non-slip stool. Keep the floor dry. Clean up any water that spills on the floor as soon as it happens. Remove soap buildup in the tub or shower regularly. Attach bath mats securely with double-sided non-slip rug tape. Do not have throw rugs and other things on the floor that can make you trip. What can I do in the bedroom? Use night lights. Make sure that you have a light by your bed that is easy to reach. Do not use any sheets or  blankets that are too big for your bed. They should not hang down onto the floor. Have a firm chair that has side arms. You can use this for support while you get dressed. Do not have throw rugs and other things on the floor that can make you trip. What can I do in the kitchen? Clean up any spills right away. Avoid walking on wet floors. Keep items that you use a lot in easy-to-reach places. If you need to reach something above you, use a strong step stool that has a grab bar. Keep electrical cords out of the way. Do not use floor polish or wax that makes floors slippery. If you must use wax, use non-skid floor wax. Do not have throw rugs and other things on the floor that can make you trip. What can I do with my stairs? Do not leave any items on the stairs. Make sure that there are handrails on both sides of the stairs and use them. Fix handrails that are broken or loose. Make sure that handrails are as long as the stairways. Check any carpeting to make sure that it is firmly attached to the stairs. Fix any carpet that is loose or worn. Avoid having throw rugs at the top or bottom of the stairs. If you do have throw rugs, attach them to the floor with carpet tape.  Make sure that you have a light switch at the top of the stairs and the bottom of the stairs. If you do not have them, ask someone to add them for you. What else can I do to help prevent falls? Wear shoes that: Do not have high heels. Have rubber bottoms. Are comfortable and fit you well. Are closed at the toe. Do not wear sandals. If you use a stepladder: Make sure that it is fully opened. Do not climb a closed stepladder. Make sure that both sides of the stepladder are locked into place. Ask someone to hold it for you, if possible. Clearly mark and make sure that you can see: Any grab bars or handrails. First and last steps. Where the edge of each step is. Use tools that help you move around (mobility aids) if they are  needed. These include: Canes. Walkers. Scooters. Crutches. Turn on the lights when you go into a dark area. Replace any light bulbs as soon as they burn out. Set up your furniture so you have a clear path. Avoid moving your furniture around. If any of your floors are uneven, fix them. If there are any pets around you, be aware of where they are. Review your medicines with your doctor. Some medicines can make you feel dizzy. This can increase your chance of falling. Ask your doctor what other things that you can do to help prevent falls. This information is not intended to replace advice given to you by your health care provider. Make sure you discuss any questions you have with your health care provider. Document Released: 03/19/2009 Document Revised: 10/29/2015 Document Reviewed: 06/27/2014 Elsevier Interactive Patient Education  2017 [employer].

## 2020-11-27 ENCOUNTER — Other Ambulatory Visit: Payer: Self-pay

## 2020-11-27 DIAGNOSIS — N1831 Chronic kidney disease, stage 3a: Secondary | ICD-10-CM

## 2020-11-27 DIAGNOSIS — I10 Essential (primary) hypertension: Secondary | ICD-10-CM

## 2020-11-27 DIAGNOSIS — R739 Hyperglycemia, unspecified: Secondary | ICD-10-CM

## 2020-11-27 DIAGNOSIS — E7800 Pure hypercholesterolemia, unspecified: Secondary | ICD-10-CM

## 2020-12-10 ENCOUNTER — Telehealth (INDEPENDENT_AMBULATORY_CARE_PROVIDER_SITE_OTHER): Payer: Medicare HMO | Admitting: Nurse Practitioner

## 2020-12-10 ENCOUNTER — Other Ambulatory Visit: Payer: Self-pay

## 2020-12-10 ENCOUNTER — Telehealth: Payer: Self-pay

## 2020-12-10 DIAGNOSIS — U071 COVID-19: Secondary | ICD-10-CM

## 2020-12-10 MED ORDER — ALBUTEROL SULFATE HFA 108 (90 BASE) MCG/ACT IN AERS: 2.0000 | INHALATION_SPRAY | Freq: Four times a day (QID) | RESPIRATORY_TRACT | 2 refills | Status: DC | PRN

## 2020-12-10 NOTE — Progress Notes (Signed)
This service is provided via telemedicine  No vital signs collected/recorded due to the encounter was a telemedicine visit.   Location of patient (ex: home, work):  Home  Patient consents to a telephone visit:  Yes, see encounter dated 12/10/2020  Location of the provider (ex: office, home):  [place]  Name of any referring provider:  N/A  Names of all persons participating in the telemedicine service and their role in the encounter:  Sherrie Mustache, Nurse Practitioner, Carroll Kinds, CMA, and patient.   Time spent on call:  10 minutes with medical assistant

## 2020-12-10 NOTE — Telephone Encounter (Signed)
Mr. bartt, gonzaga are scheduled for a virtual visit with your provider today.    Just as we do with appointments in the office, we must obtain your consent to participate.  Your consent will be active for this visit and any virtual visit you may have with one of our providers in the next 365 days.    If you have a MyChart account, I can also send a copy of this consent to you electronically.  All virtual visits are billed to your insurance company just like a traditional visit in the office.  As this is a virtual visit, video technology does not allow for your provider to perform a traditional examination.  This may limit your provider's ability to fully assess your condition.  If your provider identifies any concerns that need to be evaluated in person or the need to arrange testing such as labs, EKG, etc, we will make arrangements to do so.    Although advances in technology are sophisticated, we cannot ensure that it will always work on either your end or our end.  If the connection with a video visit is poor, we may have to switch to a telephone visit.  With either a video or telephone visit, we are not always able to ensure that we have a secure connection.   I need to obtain your verbal consent now.   Are you willing to proceed with your visit today?   JELAN BATTERTON has provided verbal consent on 12/10/2020 for a virtual visit (video or telephone).   Carroll Kinds, CMA 12/10/2020  10:35 AM

## 2020-12-10 NOTE — Progress Notes (Signed)
Careteam: Patient Care Team: Ngetich, Nelda Bucks, NP as PCP - General (Family Medicine) Warden Fillers, MD as Consulting Physician (Ophthalmology)  Advanced Directive information    Allergies  Allergen Reactions   Zocor [Simvastatin] Shortness Of Breath    Sob and itching   Ace Inhibitors Cough    Tolerating ARB instead   Lipitor [Atorvastatin]     Muscle and bone pain   Pravachol [Pravastatin Sodium] Other (See Comments)    myalgias    Chief Complaint  Patient presents with   Acute Visit    Patient did home test yesterday and was positive. Patient had headache, sore throat, coughing, low grade fever. Patient has congestion. Taking over the counter medication. No energy. COVID exposure.Symptoms started Tuesday Fever was 100. Would like to discuss medication for COVID.Patient taking Tylenol.     HPI: Patient is a 80 y.o. male  4 days ago he was fatigue, 3 days ago headache then 2 days ago sore throat and yesterday started to cough, sneezing.  He is taking OTC allergy pill. Temperature 99.5 when his wife checked.  Taking some OTC cold medication. Tooks some tylenol this morning since he felt like he had a low grade fever. Increase in fatigue.  They got together for a birthday and a lot of people at the church got sick right afterwards and they were positive for COVID. He tested last night and he was positive.  Right now in his throat and head. Sometimes in the upper part of his chest.  Some dizziness.  He has a hx of shortness of breath at times and will get short winded. This has happened a few times since he has not felt well. He has COPD but does not use inhaler.   He has been vaccinated against COVID- last booster in march.   Review of Systems:  Review of Systems  Constitutional:  Positive for chills, fever and malaise/fatigue. Negative for weight loss.  HENT:  Negative for tinnitus.   Respiratory:  Positive for cough and shortness of breath. Negative for sputum  production and wheezing.   Cardiovascular:  Negative for chest pain, palpitations and leg swelling.  Gastrointestinal:  Negative for abdominal pain, constipation, diarrhea and heartburn.  Genitourinary:  Negative for dysuria, frequency and urgency.  Musculoskeletal:  Negative for back pain, falls, joint pain and myalgias.  Skin: Negative.   Neurological:  Positive for dizziness. Negative for headaches.   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   Chest pain, unspecified 08/20/2012   Chest pain, unspecified 12/26/2011   Concussion, unspecified 10/30/1994   COPD (chronic obstructive pulmonary disease) ([place]) 02/24/2006   Corns and callosities 04/11/2007   Dermatophytosis of the body    Disturbance of skin sensation    Diverticulosis of colon (without mention of hemorrhage) 06/14/2010   Edema of male genital organs 08/20/2012   Elevated blood pressure reading without diagnosis of hypertension 06/14/2010   Encounter for long-term (current) use of other medications    Hypercholesterolemia    Hypertrophy of prostate without urinary obstruction and other lower urinary tract symptoms (LUTS)    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   Lumbago 05/11/2009   Nocturia 08/20/2012   Other and unspecified hyperlipidemia 02/24/2006   Pain in joint, ankle and foot 04/11/2007   Pterygium, unspecified 03/01/2006   Rash and other nonspecific skin eruption 12/28/1999   Seborrheic dermatitis, unspecified 03/21/2001   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 and unspecified parts of face 12/26/2011   Tobacco use disorder    Ulcer of other part of foot 03/16/2007   Unspecified constipation 05/11/2009   Urinary tract infection, site not specified 05/11/2009   Ventral hernia, unspecified, without mention of obstruction or gangrene    Past Surgical History:  Procedure Laterality Date   ELBOW SURGERY Right    EYE SURGERY      Cataract Removal   FRACTURE SURGERY  1986   right hip, right elbow   KNEE SURGERY Right 01/2007   knee cap injury   SKIN CANCER EXCISION  07/2014   Back and head    TONSILLECTOMY  1948   Social History:   reports that he quit smoking about 10 years ago. He has never used smokeless tobacco. He reports that he does not drink alcohol and does not use drugs.  Family History  Problem Relation Age of Onset   Diabetes Mother    Hypertension Mother    Cancer Father     Medications: Patient's Medications  New Prescriptions   No medications on file  Previous Medications   ASPIRIN 81 MG TABLET    Take 81 mg by mouth daily.    EVOLOCUMAB (REPATHA SURECLICK) 169 MG/ML SOAJ    Inject 1 Dose into the skin every 14 (fourteen) days.   EZETIMIBE (ZETIA) 10 MG TABLET    Take 1 tablet (10 mg total) by mouth daily.   NITROGLYCERIN (NITROSTAT) 0.4 MG SL TABLET    Place 1 tablet (0.4 mg total) under the tongue every 5 (five) minutes as needed for chest pain (max 3 doses).   OMEPRAZOLE (PRILOSEC) 20 MG CAPSULE    Take 1 capsule (20 mg total) by mouth daily.   TAMSULOSIN (FLOMAX) 0.4 MG CAPS CAPSULE    Take 1 capsule (0.4 mg total) by mouth daily.   VALSARTAN (DIOVAN) 80 MG TABLET    TAKE 1 TABLET EVERY DAY  Modified Medications   No medications on file  Discontinued Medications   No medications on file    Physical Exam:  There were no vitals filed for this visit. There is no height or weight on file to calculate BMI. Wt Readings from Last 3 Encounters:  08/13/20 135 lb (61.2 kg)  04/16/20 133 lb 3.2 oz (60.4 kg)  03/26/20 138 lb (62.6 kg)    Labs reviewed: Basic Metabolic Panel: Recent Labs    04/16/20 1046  NA 131*  K 4.6  CL 98  CO2 26  GLUCOSE 91  BUN 11  CREATININE 1.37*  CALCIUM 9.2   Liver Function Tests: Recent Labs    04/16/20 1046  AST 20  ALT 9  BILITOT 0.5  PROT 6.8   No results for input(s): LIPASE, AMYLASE in the last 8760 hours. No results for input(s):  AMMONIA in the last 8760 hours. CBC: Recent Labs    04/16/20 1046  WBC 5.7  NEUTROABS 4,167  HGB 13.6  HCT 40.4  MCV 84.0  PLT 227   Lipid Panel: Recent Labs    01/21/20 0907  CHOL 138  HDL 52  LDLCALC 69  TRIG 91  CHOLHDL 2.7   TSH: No results for input(s): TSH in the last 8760 hours. A1C: Lab Results  Component Value Date   HGBA1C 5.8 (H) 10/10/2019     Assessment/Plan 1. COVID-19 -positive home covid test  -mild symptoms at this time Discussed paxlovid but declined at this time.  -make sure you are staying well hydrated -  recommended to take Vit C 1000 mg twice daily, Vit D 2000 units daily, zinc 50 mg daily for 1 week -to do deep breathing exercises every hour -mucinex twice daily as needed for chest congestion- full glass of water  -do not sit in bed all day, sit up in chair and walk around as tolerated -continue tylenol PRN -education provided on when to follow up and when to seek immediate medication attention through the ED.    Thomas Reyes. Harle Battiest  [facility] [phone]    Virtual Visit via telephone  I connected with patient on 12/10/20 at 10:30 AM EDT by telelphone and verified that I am speaking with the correct person using two identifiers.  Location: Patient: home Provider: [facility]   I discussed the limitations, risks, security and privacy concerns of performing an evaluation and management service by telephone and the availability of in person appointments. I also discussed with the patient that there may be a patient responsible charge related to this service. The patient expressed understanding and agreed to proceed.   I discussed the assessment and treatment plan with the patient. The patient was provided an opportunity to ask questions and all were answered. The patient agreed with the plan and demonstrated an understanding of the instructions.   The patient was advised to call back or seek an  in-person evaluation if the symptoms worsen or if the condition fails to improve as anticipated.  I provided 21 minutes of non-face-to-face time during this encounter.  Thomas Reyes. Harle Battiest Avs printed and mailed

## 2020-12-11 ENCOUNTER — Other Ambulatory Visit: Payer: Medicare HMO

## 2020-12-14 ENCOUNTER — Ambulatory Visit: Payer: Medicare HMO | Admitting: Family

## 2020-12-23 ENCOUNTER — Other Ambulatory Visit: Payer: Medicare HMO

## 2020-12-23 ENCOUNTER — Other Ambulatory Visit: Payer: Self-pay

## 2020-12-23 DIAGNOSIS — E7800 Pure hypercholesterolemia, unspecified: Secondary | ICD-10-CM

## 2020-12-23 DIAGNOSIS — N1831 Chronic kidney disease, stage 3a: Secondary | ICD-10-CM

## 2020-12-23 DIAGNOSIS — R739 Hyperglycemia, unspecified: Secondary | ICD-10-CM

## 2020-12-23 DIAGNOSIS — I10 Essential (primary) hypertension: Secondary | ICD-10-CM

## 2020-12-24 LAB — CBC WITH DIFFERENTIAL/PLATELET
Absolute Monocytes: 503 cells/uL (ref 200–950)
Basophils Absolute: 13 cells/uL (ref 0–200)
Basophils Relative: 0.2 %
Eosinophils Absolute: 235 cells/uL (ref 15–500)
Eosinophils Relative: 3.5 %
HCT: 43.1 % (ref 38.5–50.0)
Hemoglobin: 14.2 g/dL (ref 13.2–17.1)
Lymphs Abs: 1327 cells/uL (ref 850–3900)
MCH: 29.2 pg (ref 27.0–33.0)
MCHC: 32.9 g/dL (ref 32.0–36.0)
MCV: 88.7 fL (ref 80.0–100.0)
MPV: 9.1 fL (ref 7.5–12.5)
Monocytes Relative: 7.5 %
Neutro Abs: 4623 cells/uL (ref 1500–7800)
Neutrophils Relative %: 69 %
Platelets: 239 10*3/uL (ref 140–400)
RBC: 4.86 10*6/uL (ref 4.20–5.80)
RDW: 13.1 % (ref 11.0–15.0)
Total Lymphocyte: 19.8 %
WBC: 6.7 10*3/uL (ref 3.8–10.8)

## 2020-12-24 LAB — BASIC METABOLIC PANEL WITH GFR
BUN/Creatinine Ratio: 10 (calc) (ref 6–22)
BUN: 14 mg/dL (ref 7–25)
CO2: 25 mmol/L (ref 20–32)
Calcium: 9.3 mg/dL (ref 8.6–10.3)
Chloride: 97 mmol/L — ABNORMAL LOW (ref 98–110)
Creat: 1.35 mg/dL — ABNORMAL HIGH (ref 0.70–1.22)
Glucose, Bld: 95 mg/dL (ref 65–99)
Potassium: 4.6 mmol/L (ref 3.5–5.3)
Sodium: 131 mmol/L — ABNORMAL LOW (ref 135–146)
eGFR: 53 mL/min/{1.73_m2} — ABNORMAL LOW (ref >=60)

## 2020-12-24 LAB — LIPID PANEL
Cholesterol: 155 mg/dL (ref <200)
HDL: 61 mg/dL (ref >=40)
LDL Cholesterol (Calc): 73 mg/dL (calc)
Non-HDL Cholesterol (Calc): 94 mg/dL (calc) (ref <130)
Total CHOL/HDL Ratio: 2.5 (calc) (ref <5.0)
Triglycerides: 127 mg/dL (ref <150)

## 2020-12-24 LAB — HEMOGLOBIN A1C
Hgb A1c MFr Bld: 5.9 % of total Hgb — ABNORMAL HIGH (ref <5.7)
Mean Plasma Glucose: 123 mg/dL
eAG (mmol/L): 6.8 mmol/L

## 2020-12-25 ENCOUNTER — Encounter: Payer: Self-pay | Admitting: Family

## 2020-12-25 ENCOUNTER — Other Ambulatory Visit: Payer: Self-pay

## 2020-12-25 ENCOUNTER — Ambulatory Visit (INDEPENDENT_AMBULATORY_CARE_PROVIDER_SITE_OTHER): Payer: Medicare HMO | Admitting: Family

## 2020-12-25 VITALS — BP 132/80 | HR 67 | Temp 97.7°F | Resp 16 | Ht 67.0 in | Wt 130.4 lb

## 2020-12-25 DIAGNOSIS — J449 Chronic obstructive pulmonary disease, unspecified: Secondary | ICD-10-CM

## 2020-12-25 DIAGNOSIS — H903 Sensorineural hearing loss, bilateral: Secondary | ICD-10-CM | POA: Diagnosis not present

## 2020-12-25 DIAGNOSIS — N401 Enlarged prostate with lower urinary tract symptoms: Secondary | ICD-10-CM

## 2020-12-25 DIAGNOSIS — N1831 Chronic kidney disease, stage 3a: Secondary | ICD-10-CM

## 2020-12-25 DIAGNOSIS — R7303 Prediabetes: Secondary | ICD-10-CM | POA: Diagnosis not present

## 2020-12-25 DIAGNOSIS — H6121 Impacted cerumen, right ear: Secondary | ICD-10-CM | POA: Diagnosis not present

## 2020-12-25 DIAGNOSIS — E7800 Pure hypercholesterolemia, unspecified: Secondary | ICD-10-CM | POA: Diagnosis not present

## 2020-12-25 DIAGNOSIS — I10 Essential (primary) hypertension: Secondary | ICD-10-CM

## 2020-12-25 DIAGNOSIS — N138 Other obstructive and reflux uropathy: Secondary | ICD-10-CM

## 2020-12-25 NOTE — Patient Instructions (Signed)
-   please get your shingles vaccine and COVID-19 booster vaccine

## 2020-12-25 NOTE — Progress Notes (Signed)
Provider: Marlowe Sax FNP-C   Madge Therrien, Nelda Bucks, NP  Patient Care Team: Zyon Rosser, Nelda Bucks, NP as PCP - General (Family Medicine) Warden Fillers, MD as Consulting Physician (Ophthalmology)  Extended Emergency Contact Information Primary Emergency Contact: [facility] Address: [place], [place] [address] [place] Phone: [phone] Relation: Spouse  Code Status:  Full Code  Goals of care: Advanced Directive information Advanced Directives 12/25/2020  Does Patient Have a Medical Advance Directive? Yes  Type of Paramedic of [place];Living will  Does patient want to make changes to medical advance directive? No - Patient declined  Copy of [place] in Chart? Yes - validated most recent copy scanned in chart (See row information)  Would patient like information on creating a medical advance directive? -     Chief Complaint  Patient presents with   Medical Management of Chronic Issues    4 month follow up.   Immunizations    Discuss the need for Shingrix vaccine, and Covid Booster.    HPI:  Pt is a 80 y.o. male seen today for medical management of chronic diseases.states doing well.   Lab work done 12/23/2020 reviewed and discussed during visit. Latest HgbA1C 5.9 previous 5.8 states drinks more soda than water.  Hardly eats breakfast.sometimes eats one meal per day.   Chol 155,TRG 127 LDL 73 all within normal range.  CR 1.35 stable compared to previous 1.37; 1.48   GERD - reports no heart burn on omeprazole.  Still has a little cough due to COVID-19 infection on 12/10/2020  seems to be getting better each day.Has not required Albuterol.had yellow greenish mucus but lately has been whitish.  BPH - voids 1-2 times at night some nights are better than others.if he does not drink any fluid after 10 pm it helps.  Due for shingrix and COVID-19 booster vaccine.aware to get  vaccine at his pharmacy.   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   Chest pain, unspecified 08/20/2012   Chest pain, unspecified 12/26/2011   Concussion, unspecified 10/30/1994   COPD (chronic obstructive pulmonary disease) ([place]) 02/24/2006   Corns and callosities 04/11/2007   Dermatophytosis of the body    Disturbance of skin sensation    Diverticulosis of colon (without mention of hemorrhage) 06/14/2010   Edema of male genital organs 08/20/2012   Elevated blood pressure reading without diagnosis of hypertension 06/14/2010   Encounter for long-term (current) use of other medications    Hypercholesterolemia    Hypertrophy of prostate without urinary obstruction and other lower urinary tract symptoms (LUTS)    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   Lumbago 05/11/2009   Nocturia 08/20/2012   Other and unspecified hyperlipidemia 02/24/2006   Pain in joint, ankle and foot 04/11/2007   Pterygium, unspecified 03/01/2006   Rash and other nonspecific skin eruption 12/28/1999   Seborrheic dermatitis, unspecified 03/21/2001   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   Tobacco use disorder    Ulcer of other part of foot 03/16/2007   Unspecified constipation 05/11/2009   Urinary tract infection, site not specified 05/11/2009   Ventral hernia, unspecified, without mention of obstruction or gangrene    Past Surgical History:  Procedure Laterality Date   ELBOW SURGERY Right    EYE SURGERY  Cataract Removal   FRACTURE SURGERY  1986   right hip, right elbow   KNEE SURGERY Right 01/2007   knee cap injury   SKIN CANCER EXCISION  07/2014   Back and head    TONSILLECTOMY  1948    Allergies  Allergen Reactions   Zocor [Simvastatin] Shortness Of Breath    Sob and itching   Ace Inhibitors Cough    Tolerating ARB instead   Lipitor [Atorvastatin]     Muscle  and bone pain   Pravachol [Pravastatin Sodium] Other (See Comments)    myalgias    Allergies as of 12/25/2020       Reactions   Zocor [simvastatin] Shortness Of Breath   Sob and itching   Ace Inhibitors Cough   Tolerating ARB instead   Lipitor [atorvastatin]    Muscle and bone pain   Pravachol [pravastatin Sodium] Other (See Comments)   myalgias        Medication List        Accurate as of December 25, 2020  9:33 AM. If you have any questions, ask your nurse or doctor.          albuterol 108 (90 Base) MCG/ACT inhaler Commonly known as: VENTOLIN HFA Inhale 2 puffs into the lungs every 6 (six) hours as needed for wheezing or shortness of breath.   aspirin 81 MG tablet Take 81 mg by mouth daily.   ezetimibe 10 MG tablet Commonly known as: ZETIA Take 1 tablet (10 mg total) by mouth daily.   nitroGLYCERIN 0.4 MG SL tablet Commonly known as: NITROSTAT Place 1 tablet (0.4 mg total) under the tongue every 5 (five) minutes as needed for chest pain (max 3 doses).   omeprazole 20 MG capsule Commonly known as: PRILOSEC Take 1 capsule (20 mg total) by mouth daily.   Repatha SureClick 622 MG/ML Soaj Generic drug: Evolocumab Inject 1 Dose into the skin every 14 (fourteen) days.   tamsulosin 0.4 MG Caps capsule Commonly known as: FLOMAX Take 1 capsule (0.4 mg total) by mouth daily.   valsartan 80 MG tablet Commonly known as: DIOVAN TAKE 1 TABLET EVERY DAY        Review of Systems  Constitutional:  Negative for appetite change, chills, fatigue, fever and unexpected weight change.  HENT:  Positive for hearing loss. Negative for congestion, dental problem, ear discharge, ear pain, facial swelling, nosebleeds, postnasal drip, rhinorrhea, sinus pressure, sinus pain, sneezing, sore throat, tinnitus and trouble swallowing.   Eyes:  Negative for pain, discharge, redness, itching and visual disturbance.  Respiratory:  Positive for cough. Negative for chest tightness, shortness  of breath and wheezing.   Cardiovascular:  Negative for chest pain, palpitations and leg swelling.  Gastrointestinal:  Negative for abdominal distention, abdominal pain, blood in stool, constipation, nausea and vomiting.       Had diarrhea last week but seems to be improving  Endocrine: Negative for cold intolerance, heat intolerance, polydipsia, polyphagia and polyuria.  Genitourinary:  Negative for difficulty urinating, dysuria, flank pain, frequency and urgency.       BPH   Musculoskeletal:  Negative for arthralgias, back pain, gait problem, joint swelling, myalgias, neck pain and neck stiffness.  Skin:  Negative for color change, pallor, rash and wound.  Neurological:  Negative for dizziness, syncope, speech difficulty, weakness, light-headedness, numbness and headaches.  Hematological:  Does not bruise/bleed easily.  Psychiatric/Behavioral:  Negative for agitation, behavioral problems, confusion, hallucinations, self-injury, sleep disturbance and suicidal ideas. The patient is not nervous/anxious.  Immunization History  Administered Date(s) Administered   Fluad Quad(high Dose 65+) 05/27/2019, 03/05/2020   Influenza, High Dose Seasonal PF 03/09/2017, 05/21/2018   Influenza,inj,Quad PF,6+ Mos 03/31/2014, 04/03/2015, 02/25/2016   PFIZER(Purple Top)SARS-COV-2 Vaccination 07/21/2019, 11/06/2019   Pneumococcal Conjugate-13 07/18/2013   Pneumococcal Polysaccharide-23 10/02/2014   Tdap 06/10/2005, 12/03/2013   Pertinent  Health Maintenance Due  Topic Date Due   INFLUENZA VACCINE  01/04/2021   PNA vac Low Risk Adult  Completed   Fall Risk  12/25/2020 11/18/2020 04/16/2020 11/25/2019 10/14/2019  Falls in the past year? 0 0 0 0 0  Number falls in past yr: 0 0 0 0 0  Injury with Fall? 0 0 0 0 0  Risk for fall due to : No Fall Risks - - - -  Follow up Falls evaluation completed - - - -   Functional Status Survey:    Vitals:   12/25/20 0921  BP: 132/80  Pulse: 67  Resp: 16  Temp: 97.7  F (36.5 C)  SpO2: 96%  Weight: 130 lb 6.4 oz (59.1 kg)  Height: $Remove'5\' 7"'hZWHoVL$  (1.702 m)   Body mass index is 20.42 kg/m. Physical Exam Vitals reviewed.  Constitutional:      General: He is not in acute distress.    Appearance: Normal appearance. He is normal weight. He is not ill-appearing or diaphoretic.  HENT:     Head: Normocephalic.     Right Ear: There is impacted cerumen.     Left Ear: Ear canal and external ear normal. There is no impacted cerumen. Tympanic membrane is perforated.     Ears:     Comments: Bilateral hearing Aids in place. ear cerumen lavaged with warm water and hydrogen peroxide moderate amounts of cerumen obtained.No instrument used.Tolerated procedure well.TM clear without any signs of infection.  Left ear TM chronic perforation      Nose: Nose normal. No congestion or rhinorrhea.     Mouth/Throat:     Mouth: Mucous membranes are moist.     Pharynx: Oropharynx is clear. No oropharyngeal exudate or posterior oropharyngeal erythema.  Eyes:     General: No scleral icterus.       Right eye: No discharge.        Left eye: No discharge.     Extraocular Movements: Extraocular movements intact.     Conjunctiva/sclera: Conjunctivae normal.     Pupils: Pupils are equal, round, and reactive to light.  Neck:     Vascular: No carotid bruit.  Cardiovascular:     Rate and Rhythm: Normal rate and regular rhythm.     Pulses: Normal pulses.     Heart sounds: Normal heart sounds. No murmur heard.   No friction rub. No gallop.  Pulmonary:     Effort: Pulmonary effort is normal. No respiratory distress.     Breath sounds: Normal breath sounds. No wheezing, rhonchi or rales.  Chest:     Chest wall: No tenderness.  Abdominal:     General: Bowel sounds are normal. There is no distension.     Palpations: Abdomen is soft. There is no mass.     Tenderness: There is no abdominal tenderness. There is no right CVA tenderness, left CVA tenderness, guarding or rebound.   Musculoskeletal:        General: No swelling or tenderness. Normal range of motion.     Cervical back: Normal range of motion. No rigidity or tenderness.     Right lower leg: No edema.  Left lower leg: No edema.  Lymphadenopathy:     Cervical: No cervical adenopathy.  Skin:    General: Skin is warm and dry.     Coloration: Skin is not pale.     Findings: No bruising, erythema, lesion or rash.  Neurological:     Mental Status: He is alert and oriented to person, place, and time.     Cranial Nerves: No cranial nerve deficit.     Sensory: No sensory deficit.     Motor: No weakness.     Coordination: Coordination normal.     Gait: Gait normal.  Psychiatric:        Mood and Affect: Mood normal.        Speech: Speech normal.        Behavior: Behavior normal.        Thought Content: Thought content normal.        Judgment: Judgment normal.    Labs reviewed: Recent Labs    04/16/20 1046 12/23/20 0831  NA 131* 131*  K 4.6 4.6  CL 98 97*  CO2 26 25  GLUCOSE 91 95  BUN 11 14  CREATININE 1.37* 1.35*  CALCIUM 9.2 9.3   Recent Labs    04/16/20 1046  AST 20  ALT 9  BILITOT 0.5  PROT 6.8   Recent Labs    04/16/20 1046 12/23/20 0831  WBC 5.7 6.7  NEUTROABS 4,167 4,623  HGB 13.6 14.2  HCT 40.4 43.1  MCV 84.0 88.7  PLT 227 239   Lab Results  Component Value Date   TSH 2.93 11/25/2019   Lab Results  Component Value Date   HGBA1C 5.9 (H) 12/23/2020   Lab Results  Component Value Date   CHOL 155 12/23/2020   HDL 61 12/23/2020   LDLCALC 73 12/23/2020   TRIG 127 12/23/2020   CHOLHDL 2.5 12/23/2020    Significant Diagnostic Results in last 30 days:  No results found.  Assessment/Plan  1. Essential hypertension B/p well controlled. - continue on Valsartan  - CBC with Differential/Platelet; Future - CMP with eGFR([facility]); Future - TSH; Future - Lipid panel; Future  2. Hypercholesterolemia LDL at goal  Continue on Repatha  and Zetia  - Lipid panel;  Future  3. Stage 3a chronic kidney disease (HCC) CR at baseline Continue to avoid NSAID's and dose for medication for renal clearance   4. Prediabetes Lab Results  Component Value Date   HGBA1C 5.9 (H) 12/23/2020  Advised to cut down on soda's and carbohydrates - Hemoglobin A1c; Future  5. COPD mixed type ([place]) Breathing stable. - continue on Albuterol  - CBC with Differential/Platelet; Future - CMP with eGFR([facility]); Future  6. BPH with obstruction/lower urinary tract symptoms Symptoms have improved on tamsulosin    7. Right ear impacted cerumen Right ear cerumen lavaged with warm water and hydrogen peroxide moderate amounts of cerumen obtained.No instrument used.Tolerated procedure well.TM clear without any signs of infection. 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    Sandrea Hughs, NP

## 2021-01-18 ENCOUNTER — Other Ambulatory Visit: Payer: Self-pay | Admitting: *Deleted

## 2021-01-18 MED ORDER — VALSARTAN 80 MG PO TABS: 80.0000 mg | ORAL_TABLET | Freq: Every day | ORAL | 1 refills | Status: DC

## 2021-01-18 MED ORDER — OMEPRAZOLE 20 MG PO CPDR: 20.0000 mg | DELAYED_RELEASE_CAPSULE | Freq: Every day | ORAL | 1 refills | Status: DC

## 2021-01-18 NOTE — Telephone Encounter (Signed)
Bethena Roys, wife called and requested refill to be sent to [facility].

## 2021-03-17 DIAGNOSIS — T466X5A Adverse effect of antihyperlipidemic and antiarteriosclerotic drugs, initial encounter: Secondary | ICD-10-CM | POA: Diagnosis not present

## 2021-03-17 DIAGNOSIS — E785 Hyperlipidemia, unspecified: Secondary | ICD-10-CM | POA: Diagnosis not present

## 2021-03-17 DIAGNOSIS — M791 Myalgia, unspecified site: Secondary | ICD-10-CM | POA: Diagnosis not present

## 2021-03-17 DIAGNOSIS — R931 Abnormal findings on diagnostic imaging of heart and coronary circulation: Secondary | ICD-10-CM | POA: Diagnosis not present

## 2021-03-18 LAB — LIPID PANEL
Chol/HDL Ratio: 2.8 ratio (ref 0.0–5.0)
Cholesterol, Total: 159 mg/dL (ref 100–199)
HDL: 56 mg/dL (ref >39)
LDL Chol Calc (NIH): 84 mg/dL (ref 0–99)
Triglycerides: 107 mg/dL (ref 0–149)
VLDL Cholesterol Cal: 19 mg/dL (ref 5–40)

## 2021-03-19 ENCOUNTER — Ambulatory Visit: Payer: Medicare HMO | Admitting: Adult Health

## 2021-03-26 ENCOUNTER — Ambulatory Visit (HOSPITAL_BASED_OUTPATIENT_CLINIC_OR_DEPARTMENT_OTHER): Payer: Medicare HMO | Admitting: Internal Medicine

## 2021-03-26 ENCOUNTER — Other Ambulatory Visit: Payer: Self-pay

## 2021-03-26 VITALS — BP 152/56 | HR 59 | Ht 67.0 in | Wt 132.4 lb

## 2021-03-26 DIAGNOSIS — E785 Hyperlipidemia, unspecified: Secondary | ICD-10-CM

## 2021-03-26 DIAGNOSIS — R931 Abnormal findings on diagnostic imaging of heart and coronary circulation: Secondary | ICD-10-CM | POA: Diagnosis not present

## 2021-03-26 DIAGNOSIS — M791 Myalgia, unspecified site: Secondary | ICD-10-CM

## 2021-03-26 DIAGNOSIS — T466X5A Adverse effect of antihyperlipidemic and antiarteriosclerotic drugs, initial encounter: Secondary | ICD-10-CM

## 2021-03-26 DIAGNOSIS — T466X5D Adverse effect of antihyperlipidemic and antiarteriosclerotic drugs, subsequent encounter: Secondary | ICD-10-CM

## 2021-03-26 NOTE — Patient Instructions (Signed)
Medication Instructions:  Your physician recommends that you continue on your current medications as directed. Please refer to the Current Medication list given to you today.  *If you need a refill on your cardiac medications before your next appointment, please call your pharmacy*   Lab Work: FASTING lipid panel in 1 year to check cholesterol   If you have labs (blood work) drawn today and your tests are completely normal, you will receive your results only by: [place] (if you have MyChart) OR A paper copy in the mail If you have any lab test that is abnormal or we need to change your treatment, we will call you to review the results.   Testing/Procedures: NONE   Follow-Up: At [facility], you and your health needs are our priority.  As part of our continuing mission to provide you with exceptional heart care, we have created designated Provider Care Teams.  These Care Teams include your primary Cardiologist (physician) and Advanced Practice Providers (APPs -  Physician Assistants and Nurse Practitioners) who all work together to provide you with the care you need, when you need it.  We recommend signing up for the patient portal called "MyChart".  Sign up information is provided on this After Visit Summary.  MyChart is used to connect with patients for Virtual Visits (Telemedicine).  Patients are able to view lab/test results, encounter notes, upcoming appointments, etc.  Non-urgent messages can be sent to your provider as well.   To learn more about what you can do with MyChart, go to [email or web address].    Your next appointment:   12 month(s) - lipid clinic  The format for your next appointment:   In Person  Provider:   You may see Dr. Debara Pickett or one of the following Advanced Practice Providers on your designated Care Team:   Almyra Deforest, PA-C Fabian Sharp, PA-C or  Roby Lofts, [place]

## 2021-03-26 NOTE — Progress Notes (Signed)
LIPID CLINIC CONSULT NOTE  Chief Complaint:  Manage dyslipidemia  Primary Care Physician: Ngetich, Nelda Bucks, NP  Primary Cardiologist:  None  HPI:  Thomas Reyes is a 80 y.o. male who is being seen today for the evaluation of dyslipidemia at the request of Ngetich, [name] C, NP.  This is a pleasant 80 year old male with a history of tobacco abuse, prior pack per day smoker for 40 to 50 years, with history of COPD not on inhalers, who recently had progressive symptoms of dyspnea on exertion and underwent nuclear stress testing in our office several days ago which was negative for ischemia.  He was referred for lipid management due to persistent dyslipidemia.  He reports has been off of statins for about 3 years due to significant myalgias.  In the past he has tried Lipitor, Zocor and Pravachol, all of which caused side effects.  His total cholesterol was 269, HDL 50, triglycerides 135 and LDL 192.  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  In addition he had a CT scan of the chest last year which showed multivessel coronary calcium and aortic atherosclerosis.  Based on these findings he needs significant, in fact greater than 50% reduction in LDL cholesterol due to recent consensus guidelines in 2018.  Since he is unable to tolerate high potency statins, he would not likely reach these targets without a PCSK9 inhibitor.  03/26/2021  Mr. Deaton returns today for routine follow-up.  His lipids have gone up slightly.  Total cholesterol now 159, triglycerides 107, HDL 56 and LDL 84.  Previous LDL was 69 about a year ago.  He reports compliance with Repatha which was a cost issue for a while but that has improved and ezetimibe.  I suspect there are dietary issues for this.  He reports being less active.  He denies any chest pain or worsening shortness of breath.  He does get some occasional issues with his COPD primarily in the summer for which she has to use inhalers more  frequently.  PMHx:  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   Chest pain, unspecified 08/20/2012   Chest pain, unspecified 12/26/2011   Concussion, unspecified 10/30/1994   COPD (chronic obstructive pulmonary disease) ([place]) 02/24/2006   Corns and callosities 04/11/2007   Dermatophytosis of the body    Disturbance of skin sensation    Diverticulosis of colon (without mention of hemorrhage) 06/14/2010   Edema of male genital organs 08/20/2012   Elevated blood pressure reading without diagnosis of hypertension 06/14/2010   Encounter for long-term (current) use of other medications    Hypercholesterolemia    Hypertrophy of prostate without urinary obstruction and other lower urinary tract symptoms (LUTS)    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   Lumbago 05/11/2009   Nocturia 08/20/2012   Other and unspecified hyperlipidemia 02/24/2006   Pain in joint, ankle and foot 04/11/2007   Pterygium, unspecified 03/01/2006   Rash and other nonspecific skin eruption 12/28/1999   Seborrheic dermatitis, unspecified 03/21/2001   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   Tobacco use disorder    Ulcer of other part of foot 03/16/2007   Unspecified constipation 05/11/2009   Urinary tract infection, site not specified 05/11/2009   Ventral hernia, unspecified, without mention of obstruction or gangrene     Past Surgical History:  Procedure Laterality Date   ELBOW SURGERY Right  EYE SURGERY     Cataract Removal   FRACTURE SURGERY  1986   right hip, right elbow   KNEE SURGERY Right 01/2007   knee cap injury   SKIN CANCER EXCISION  07/2014   Back and head    TONSILLECTOMY  1948    FAMHx:  Family History  Problem Relation Age of Onset   Diabetes Mother    Hypertension Mother    Cancer Father     SOCHx:   reports that he quit smoking about 10 years ago. His  smoking use included cigarettes. He has never used smokeless tobacco. He reports that he does not drink alcohol and does not use drugs.  ALLERGIES:  Allergies  Allergen Reactions   Zocor [Simvastatin] Shortness Of Breath    Sob and itching   Ace Inhibitors Cough    Tolerating ARB instead   Lipitor [Atorvastatin]     Muscle and bone pain   Pravachol [Pravastatin Sodium] Other (See Comments)    myalgias    ROS: Pertinent items noted in HPI and remainder of comprehensive ROS otherwise negative.  HOME MEDS: Current Outpatient Medications on File Prior to Visit  Medication Sig Dispense Refill   albuterol (VENTOLIN HFA) 108 (90 Base) MCG/ACT inhaler Inhale 2 puffs into the lungs every 6 (six) hours as needed for wheezing or shortness of breath. 8 g 2   aspirin 81 MG tablet Take 81 mg by mouth daily.      Evolocumab (REPATHA SURECLICK) 650 MG/ML SOAJ Inject 1 Dose into the skin every 14 (fourteen) days. 2 mL 11   ezetimibe (ZETIA) 10 MG tablet Take 1 tablet (10 mg total) by mouth daily. 90 tablet 3   nitroGLYCERIN (NITROSTAT) 0.4 MG SL tablet Place 1 tablet (0.4 mg total) under the tongue every 5 (five) minutes as needed for chest pain (max 3 doses). 25 tablet 3   omeprazole (PRILOSEC) 20 MG capsule Take 1 capsule (20 mg total) by mouth daily. 90 capsule 1   tamsulosin (FLOMAX) 0.4 MG CAPS capsule Take 1 capsule (0.4 mg total) by mouth daily. 90 capsule 1   valsartan (DIOVAN) 80 MG tablet Take 1 tablet (80 mg total) by mouth daily. 90 tablet 1   No current facility-administered medications on file prior to visit.    LABS/IMAGING: No results found for this or any previous visit (from the past 48 hour(s)). No results found.  LIPID PANEL:    Component Value Date/Time   CHOL 159 03/17/2021 0943   TRIG 107 03/17/2021 0943   HDL 56 03/17/2021 0943   CHOLHDL 2.8 03/17/2021 0943   CHOLHDL 2.5 12/23/2020 0831   VLDL 16 11/01/2016 0807   LDLCALC 84 03/17/2021 0943   LDLCALC 73  12/23/2020 0831    WEIGHTS: Wt Readings from Last 3 Encounters:  03/26/21 132 lb 6.4 oz (60.1 kg)  12/25/20 130 lb 6.4 oz (59.1 kg)  08/13/20 135 lb (61.2 kg)    VITALS: BP (!) 152/56   Pulse (!) 59   Ht 5\' 7"  (1.702 m)   Wt 132 lb 6.4 oz (60.1 kg)   SpO2 97%   BMI 20.74 kg/m   EXAM: General appearance: alert and no distress Neck: no carotid bruit, no JVD, and thyroid not enlarged, symmetric, no tenderness/mass/nodules Lungs: diminished breath sounds bilaterally Heart: regular rate and rhythm, S1, S2 normal, no murmur, click, rub or gallop Abdomen: soft, non-tender; bowel sounds normal; no masses,  no organomegaly Extremities: extremities normal, atraumatic, no cyanosis or edema Pulses:  2+ and symmetric Skin: Skin color, texture, turgor normal. No rashes or lesions Neurologic: Grossly normal Deferred  EKG: Deferred  ASSESSMENT: Multivessel coronary artery calcification Longstanding tobacco abuse with probable COPD, on inhalers Recent progressive dyspnea on exertion-negative Myoview stress test (06/2019) Essential hypertension Statin myalgias  PLAN: 1.   Mr. Gencarelli has had a small increase in his cholesterol but in general has been much better controlled.  He continues with Repatha and ezetimibe.  We discussed the importance of dietary and lifestyle factors as far as controlling his cholesterol further.  More regular exercise would also be good for his COPD.  Plan follow-up with repeat lipids in a year with me and have encouraged him to follow-up with his PCP in the interim.  Pixie Casino, MD, [facility], [place] Director of the [facility]  [facility] Diplomate of the American Board of Clinical Lipidology Attending Cardiologist  Direct Dial: [phone]  Fax: [phone]  Website:  [email or web address] Janes 03/26/2021, 12:45 PM

## 2021-05-19 ENCOUNTER — Telehealth: Payer: Self-pay | Admitting: *Deleted

## 2021-05-19 NOTE — Telephone Encounter (Signed)
I agree with plan to call 9-1-1 due to low blood pressure and dizziness.Possible dehydration from diarrhea.

## 2021-05-19 NOTE — Telephone Encounter (Signed)
Called and spoke with wife and informed her that Thomas Reyes was still in room with patient. Informed her that if patient's symptoms worsens she needs to call 911. She agreed.  Stated that he is drinking more now, water and Gatorade but still has the Diarrhea and low blood pressure and not eating.

## 2021-05-19 NOTE — Telephone Encounter (Signed)
Patient wife called and stated that patient has not felt good all day. Stated that his blood pressure is 95/53 and he has diarrhea and Dizzy. Feels like patient is running a fever. This morning he wouldn't get out of bed. Not drinking much. Stated he didn't drink yesterday and then start drinking alittle bit around 2 this afternoon.  Has not ran a Covid test.   I suggested with the low blood pressure to call 911 to be evaluated.  The wife stated that she is going to leave that up to the patient and wants to know if they could just leave off the blood pressure medication or cut it in half.   Please Advise.

## 2021-05-20 NOTE — Telephone Encounter (Signed)
Called patient to check on him.  Wife stated that patient was doing much better. They did not take him to ER or call 911.  Stated that they rechecked his blood pressure last night and it came up to 135/65. Stated that he was drinking and eating.  Checked his blood pressure this morning and it was 133/66 with Pulse of 72. Wife is aware to call the office or 911 if symptoms worsens.

## 2021-05-28 ENCOUNTER — Telehealth: Payer: Self-pay

## 2021-05-28 NOTE — Telephone Encounter (Signed)
Authorization is approved until 06/05/2022. CMM Key: [id number]

## 2021-06-09 ENCOUNTER — Other Ambulatory Visit: Payer: Self-pay | Admitting: *Deleted

## 2021-06-09 MED ORDER — VALSARTAN 80 MG PO TABS: 80.0000 mg | ORAL_TABLET | Freq: Every day | ORAL | 1 refills | Status: DC

## 2021-06-09 MED ORDER — TAMSULOSIN HCL 0.4 MG PO CAPS: 0.4000 mg | ORAL_CAPSULE | Freq: Every day | ORAL | 1 refills | Status: DC

## 2021-06-09 NOTE — Telephone Encounter (Signed)
Pharmacy Requested refill.  

## 2021-06-09 NOTE — Addendum Note (Signed)
Addended by: Rafael Bihari A on: 06/09/2021 04:52 PM   Modules accepted: Orders

## 2021-06-10 ENCOUNTER — Other Ambulatory Visit: Payer: Self-pay | Admitting: Family

## 2021-06-11 ENCOUNTER — Other Ambulatory Visit: Payer: Self-pay | Admitting: Family

## 2021-06-11 ENCOUNTER — Other Ambulatory Visit: Payer: Self-pay | Admitting: Internal Medicine

## 2021-06-21 ENCOUNTER — Telehealth: Payer: Self-pay | Admitting: Internal Medicine

## 2021-06-21 NOTE — Telephone Encounter (Signed)
Patient approved for [employer] grant for hypercholesterolemia from 07/18/21 - 07/17/22  [employer]well ID: [id number] Pharmacy card ID: [id number] BIN: [id number] PCN: [id number] Group: [id number]

## 2021-06-29 ENCOUNTER — Other Ambulatory Visit: Payer: Medicare HMO

## 2021-06-29 ENCOUNTER — Other Ambulatory Visit: Payer: Self-pay

## 2021-06-29 DIAGNOSIS — E7800 Pure hypercholesterolemia, unspecified: Secondary | ICD-10-CM

## 2021-06-29 DIAGNOSIS — J449 Chronic obstructive pulmonary disease, unspecified: Secondary | ICD-10-CM

## 2021-06-29 DIAGNOSIS — R7303 Prediabetes: Secondary | ICD-10-CM

## 2021-06-29 DIAGNOSIS — I10 Essential (primary) hypertension: Secondary | ICD-10-CM | POA: Diagnosis not present

## 2021-06-30 LAB — COMPLETE METABOLIC PANEL WITH GFR
AG Ratio: 1.3 (calc) (ref 1.0–2.5)
ALT: 7 U/L — ABNORMAL LOW (ref 9–46)
AST: 15 U/L (ref 10–35)
Albumin: 3.7 g/dL (ref 3.6–5.1)
Alkaline phosphatase (APISO): 69 U/L (ref 35–144)
BUN/Creatinine Ratio: 10 (calc) (ref 6–22)
BUN: 13 mg/dL (ref 7–25)
CO2: 30 mmol/L (ref 20–32)
Calcium: 8.9 mg/dL (ref 8.6–10.3)
Chloride: 104 mmol/L (ref 98–110)
Creat: 1.34 mg/dL — ABNORMAL HIGH (ref 0.70–1.22)
Globulin: 2.8 g/dL (calc) (ref 1.9–3.7)
Glucose, Bld: 94 mg/dL (ref 65–99)
Potassium: 4.3 mmol/L (ref 3.5–5.3)
Sodium: 138 mmol/L (ref 135–146)
Total Bilirubin: 0.6 mg/dL (ref 0.2–1.2)
Total Protein: 6.5 g/dL (ref 6.1–8.1)
eGFR: 53 mL/min/{1.73_m2} — ABNORMAL LOW (ref >=60)

## 2021-06-30 LAB — CBC WITH DIFFERENTIAL/PLATELET
Absolute Monocytes: 309 cells/uL (ref 200–950)
Basophils Absolute: 20 cells/uL (ref 0–200)
Basophils Relative: 0.4 %
Eosinophils Absolute: 270 cells/uL (ref 15–500)
Eosinophils Relative: 5.5 %
HCT: 43.8 % (ref 38.5–50.0)
Hemoglobin: 14.4 g/dL (ref 13.2–17.1)
Lymphs Abs: 1303 cells/uL (ref 850–3900)
MCH: 29.4 pg (ref 27.0–33.0)
MCHC: 32.9 g/dL (ref 32.0–36.0)
MCV: 89.6 fL (ref 80.0–100.0)
MPV: 9.9 fL (ref 7.5–12.5)
Monocytes Relative: 6.3 %
Neutro Abs: 2999 cells/uL (ref 1500–7800)
Neutrophils Relative %: 61.2 %
Platelets: 240 10*3/uL (ref 140–400)
RBC: 4.89 10*6/uL (ref 4.20–5.80)
RDW: 12.1 % (ref 11.0–15.0)
Total Lymphocyte: 26.6 %
WBC: 4.9 10*3/uL (ref 3.8–10.8)

## 2021-06-30 LAB — HEMOGLOBIN A1C
Hgb A1c MFr Bld: 6 % of total Hgb — ABNORMAL HIGH (ref <5.7)
Mean Plasma Glucose: 126 mg/dL
eAG (mmol/L): 7 mmol/L

## 2021-06-30 LAB — LIPID PANEL
Cholesterol: 132 mg/dL (ref <200)
HDL: 51 mg/dL (ref >=40)
LDL Cholesterol (Calc): 61 mg/dL (calc)
Non-HDL Cholesterol (Calc): 81 mg/dL (calc) (ref <130)
Total CHOL/HDL Ratio: 2.6 (calc) (ref <5.0)
Triglycerides: 115 mg/dL (ref <150)

## 2021-06-30 LAB — TSH: TSH: 3.07 mIU/L (ref 0.40–4.50)

## 2021-07-02 ENCOUNTER — Ambulatory Visit (INDEPENDENT_AMBULATORY_CARE_PROVIDER_SITE_OTHER): Payer: Medicare HMO | Admitting: Family

## 2021-07-02 ENCOUNTER — Encounter: Payer: Self-pay | Admitting: Family

## 2021-07-02 ENCOUNTER — Other Ambulatory Visit: Payer: Self-pay

## 2021-07-02 VITALS — BP 150/70 | HR 51 | Temp 97.1°F | Resp 16 | Ht 67.0 in | Wt 129.8 lb

## 2021-07-02 DIAGNOSIS — J449 Chronic obstructive pulmonary disease, unspecified: Secondary | ICD-10-CM | POA: Diagnosis not present

## 2021-07-02 DIAGNOSIS — R7303 Prediabetes: Secondary | ICD-10-CM

## 2021-07-02 DIAGNOSIS — N138 Other obstructive and reflux uropathy: Secondary | ICD-10-CM

## 2021-07-02 DIAGNOSIS — I10 Essential (primary) hypertension: Secondary | ICD-10-CM | POA: Diagnosis not present

## 2021-07-02 DIAGNOSIS — E7800 Pure hypercholesterolemia, unspecified: Secondary | ICD-10-CM

## 2021-07-02 DIAGNOSIS — K219 Gastro-esophageal reflux disease without esophagitis: Secondary | ICD-10-CM | POA: Diagnosis not present

## 2021-07-02 DIAGNOSIS — Z23 Encounter for immunization: Secondary | ICD-10-CM

## 2021-07-02 DIAGNOSIS — N401 Enlarged prostate with lower urinary tract symptoms: Secondary | ICD-10-CM | POA: Diagnosis not present

## 2021-07-02 MED ORDER — VALSARTAN 160 MG PO TABS: 160.0000 mg | ORAL_TABLET | Freq: Every day | ORAL | 0 refills | Status: DC

## 2021-07-02 NOTE — Progress Notes (Signed)
Provider: Marlowe Sax FNP-C   Sintia Mckissic, Nelda Bucks, NP  Patient Care Team: Emmajo Bennette, Nelda Bucks, NP as PCP - General (Family Medicine) Warden Fillers, MD as Consulting Physician (Ophthalmology)  Extended Emergency Contact Information Primary Emergency Contact: [facility] Address: [place], [place] [address] [place] Phone: [phone] Relation: Spouse  Code Status:  Full Code  Goals of care: Advanced Directive information Advanced Directives 07/02/2021  Does Patient Have a Medical Advance Directive? Yes  Type of Advance Directive Living will  Does patient want to make changes to medical advance directive? No - Patient declined  Copy of [place] in Chart? -  Would patient like information on creating a medical advance directive? -     Chief Complaint  Patient presents with   Medical Management of Chronic Issues    6 month follow up/ Discuss labs.   Immunizations    Discuss the need for Shingrix vaccine, and Covid Booster.    HPI:  Pt is a 81 y.o. male seen today for 6 months follow up for  medical management of chronic diseases. Has a medical history of Hypertension,hyperlipidemia,prediabetes,,GERD,BPH, COPD among others.   Hypertension - states readings SBP 120's -130's. Reading high today. denies any headache,dizziness,vision changes,fatigue,chest tightness,palpitation,chest pain or shortness of breath.     States eats two meals.usually skips breakfast just drinks coffee and sweets.Does eat lunch and dinner. Meals include bacon and ham.    Does not exercise biut stays active through out the day.   Recent labs reviewed and discussed. Hgb A1C 6.0 prev 5.9  CR 1.34 prev 1.35   Due for Shingles vaccine will get from Pharmacy   Has had x 2 COVID-19 vaccine but states will not get booster.    Past Medical History:  Diagnosis Date   Allergic rhinitis, cause unspecified 09/08/2006   Carpal  tunnel syndrome 03/16/2009   Chest pain, unspecified 08/20/2012   Chest pain, unspecified 12/26/2011   Concussion, unspecified 10/30/1994   COPD (chronic obstructive pulmonary disease) ([place]) 02/24/2006   Corns and callosities 04/11/2007   Dermatophytosis of the body    Disturbance of skin sensation    Diverticulosis of colon (without mention of hemorrhage) 06/14/2010   Edema of male genital organs 08/20/2012   Elevated blood pressure reading without diagnosis of hypertension 06/14/2010   Encounter for long-term (current) use of other medications    Hypercholesterolemia    Hypertrophy of prostate without urinary obstruction and other lower urinary tract symptoms (LUTS)    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   Lumbago 05/11/2009   Nocturia 08/20/2012   Other and unspecified hyperlipidemia 02/24/2006   Pain in joint, ankle and foot 04/11/2007   Pterygium, unspecified 03/01/2006   Rash and other nonspecific skin eruption 12/28/1999   Seborrheic dermatitis, unspecified 03/21/2001   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   Tobacco use disorder    Ulcer of other part of foot 03/16/2007   Unspecified constipation 05/11/2009   Urinary tract infection, site not specified 05/11/2009   Ventral hernia, unspecified, without mention of obstruction or gangrene    Past Surgical History:  Procedure Laterality Date   ELBOW SURGERY Right    EYE SURGERY     Cataract Removal   FRACTURE SURGERY  1986   right hip, right elbow   KNEE SURGERY Right 01/2007   knee cap injury   SKIN CANCER  EXCISION  07/2014   Back and head    TONSILLECTOMY  1948    Allergies  Allergen Reactions   Zocor [Simvastatin] Shortness Of Breath    Sob and itching   Ace Inhibitors Cough    Tolerating ARB instead   Lipitor [Atorvastatin]     Muscle and bone pain   Pravachol [Pravastatin Sodium] Other (See Comments)    myalgias    Allergies as of 07/02/2021       Reactions    Zocor [simvastatin] Shortness Of Breath   Sob and itching   Ace Inhibitors Cough   Tolerating ARB instead   Lipitor [atorvastatin]    Muscle and bone pain   Pravachol [pravastatin Sodium] Other (See Comments)   myalgias        Medication List        Accurate as of July 02, 2021  9:46 AM. If you have any questions, ask your nurse or doctor.          STOP taking these medications    aspirin 81 MG tablet Stopped by: Sandrea Hughs, NP       TAKE these medications    albuterol 108 (90 Base) MCG/ACT inhaler Commonly known as: VENTOLIN HFA Inhale 2 puffs into the lungs every 6 (six) hours as needed for wheezing or shortness of breath.   ezetimibe 10 MG tablet Commonly known as: ZETIA TAKE 1 TABLET (10 MG TOTAL) BY MOUTH DAILY.   nitroGLYCERIN 0.4 MG SL tablet Commonly known as: NITROSTAT Place 1 tablet (0.4 mg total) under the tongue every 5 (five) minutes as needed for chest pain (max 3 doses).   omeprazole 20 MG capsule Commonly known as: PRILOSEC TAKE 1 CAPSULE EVERY DAY   Repatha SureClick 387 MG/ML Soaj Generic drug: Evolocumab Inject 1 Dose into the skin every 14 (fourteen) days.   tamsulosin 0.4 MG Caps capsule Commonly known as: FLOMAX Take 1 capsule (0.4 mg total) by mouth daily.   valsartan 80 MG tablet Commonly known as: DIOVAN Take 1 tablet (80 mg total) by mouth daily.        Review of Systems  Constitutional:  Negative for appetite change, chills, fatigue, fever and unexpected weight change.  HENT:  Negative for congestion, dental problem, ear discharge, ear pain, facial swelling, hearing loss, nosebleeds, postnasal drip, rhinorrhea, sinus pressure, sinus pain, sneezing, sore throat, tinnitus and trouble swallowing.   Eyes:  Negative for pain, discharge, redness, itching and visual disturbance.  Respiratory:  Negative for cough, chest tightness, shortness of breath and wheezing.   Cardiovascular:  Negative for chest pain,  palpitations and leg swelling.  Gastrointestinal:  Negative for abdominal distention, abdominal pain, blood in stool, constipation, diarrhea, nausea and vomiting.  Endocrine: Negative for cold intolerance, heat intolerance, polydipsia, polyphagia and polyuria.  Genitourinary:  Negative for difficulty urinating, dysuria, flank pain, frequency and urgency.  Musculoskeletal:  Negative for arthralgias, back pain, gait problem, joint swelling, myalgias, neck pain and neck stiffness.  Skin:  Negative for color change, pallor, rash and wound.  Neurological:  Negative for dizziness, syncope, speech difficulty, weakness, light-headedness, numbness and headaches.  Hematological:  Does not bruise/bleed easily.  Psychiatric/Behavioral:  Negative for agitation, behavioral problems, confusion, hallucinations, self-injury, sleep disturbance and suicidal ideas. The patient is not nervous/anxious.    Immunization History  Administered Date(s) Administered   Fluad Quad(high Dose 65+) 05/27/2019, 03/05/2020   Influenza, High Dose Seasonal PF 03/09/2017, 05/21/2018   Influenza,inj,Quad PF,6+ Mos 03/31/2014, 04/03/2015, 02/25/2016   PFIZER(Purple Top)SARS-COV-2  Vaccination 07/21/2019, 11/06/2019   Pneumococcal Conjugate-13 07/18/2013   Pneumococcal Polysaccharide-23 10/02/2014   Tdap 06/10/2005, 12/03/2013   Pertinent  Health Maintenance Due  Topic Date Due   INFLUENZA VACCINE  01/04/2021   Fall Risk 11/25/2019 04/16/2020 11/18/2020 12/25/2020 07/02/2021  Falls in the past year? 0 0 0 0 0  Was there an injury with Fall? 0 0 0 0 0  Fall Risk Category Calculator 0 0 0 0 0  Fall Risk Category Low Low Low Low Low  Patient Fall Risk Level Low fall risk Low fall risk Low fall risk Low fall risk Low fall risk  Patient at Risk for Falls Due to - - - No Fall Risks No Fall Risks  Fall risk Follow up - - - Falls evaluation completed Falls evaluation completed   Functional Status Survey:    Vitals:   07/02/21 0938   BP: (!) 150/90  Pulse: (!) 51  Resp: 16  Temp: (!) 97.1 F (36.2 C)  SpO2: 98%  Weight: 129 lb 12.8 oz (58.9 kg)  Height: 5\' 7"  (1.702 m)   Body mass index is 20.33 kg/m. Physical Exam Vitals reviewed.  Constitutional:      General: He is not in acute distress.    Appearance: Normal appearance. He is normal weight. He is not ill-appearing or diaphoretic.  HENT:     Head: Normocephalic.     Right Ear: Tympanic membrane, ear canal and external ear normal. There is no impacted cerumen.     Left Ear: Tympanic membrane, ear canal and external ear normal. There is no impacted cerumen.     Nose: Nose normal. No congestion or rhinorrhea.     Mouth/Throat:     Mouth: Mucous membranes are moist.     Pharynx: Oropharynx is clear. No oropharyngeal exudate or posterior oropharyngeal erythema.  Eyes:     General: No scleral icterus.       Right eye: No discharge.        Left eye: No discharge.     Extraocular Movements: Extraocular movements intact.     Conjunctiva/sclera: Conjunctivae normal.     Pupils: Pupils are equal, round, and reactive to light.  Neck:     Vascular: No carotid bruit.  Cardiovascular:     Rate and Rhythm: Normal rate and regular rhythm.     Pulses: Normal pulses.     Heart sounds: Normal heart sounds. No murmur heard.   No friction rub. No gallop.  Pulmonary:     Effort: Pulmonary effort is normal. No respiratory distress.     Breath sounds: Normal breath sounds. No wheezing, rhonchi or rales.  Chest:     Chest wall: No tenderness.  Abdominal:     General: Bowel sounds are normal. There is no distension.     Palpations: Abdomen is soft. There is no mass.     Tenderness: There is no abdominal tenderness. There is no right CVA tenderness, left CVA tenderness, guarding or rebound.  Musculoskeletal:        General: No swelling or tenderness. Normal range of motion.     Cervical back: Normal range of motion. No rigidity or tenderness.     Right lower leg: No  edema.     Left lower leg: No edema.  Lymphadenopathy:     Cervical: No cervical adenopathy.  Skin:    General: Skin is warm and dry.     Coloration: Skin is not pale.     Findings: No bruising, erythema,  lesion or rash.  Neurological:     Mental Status: He is alert and oriented to person, place, and time.     Cranial Nerves: No cranial nerve deficit.     Sensory: No sensory deficit.     Motor: No weakness.     Coordination: Coordination normal.     Gait: Gait normal.  Psychiatric:        Mood and Affect: Mood normal.        Speech: Speech normal.        Behavior: Behavior normal.        Thought Content: Thought content normal.        Judgment: Judgment normal.    Labs reviewed: Recent Labs    12/23/20 0831 06/29/21 0837  NA 131* 138  K 4.6 4.3  CL 97* 104  CO2 25 30  GLUCOSE 95 94  BUN 14 13  CREATININE 1.35* 1.34*  CALCIUM 9.3 8.9   Recent Labs    06/29/21 0837  AST 15  ALT 7*  BILITOT 0.6  PROT 6.5   Recent Labs    12/23/20 0831 06/29/21 0837  WBC 6.7 4.9  NEUTROABS 4,623 2,999  HGB 14.2 14.4  HCT 43.1 43.8  MCV 88.7 89.6  PLT 239 240   Lab Results  Component Value Date   TSH 3.07 06/29/2021   Lab Results  Component Value Date   HGBA1C 6.0 (H) 06/29/2021   Lab Results  Component Value Date   CHOL 132 06/29/2021   HDL 51 06/29/2021   LDLCALC 61 06/29/2021   TRIG 115 06/29/2021   CHOLHDL 2.6 06/29/2021    Significant Diagnostic Results in last 30 days:  No results found.  Assessment/Plan  1. Need for influenza vaccination Afebrile  Flut shot administered by CMA no acute reaction reported.  - Flu Vaccine QUAD High Dose(Fluad)  2. Essential hypertension B/p not at gaol  - advised to increase valsartan from 120 mg to 160 mg tablet daily  - valsartan (DIOVAN) 160 MG tablet; Take 1 tablet (160 mg total) by mouth daily.  Dispense: 90 tablet; Refill: 0 - follow up   3. Hypercholesterolemia LDL at goal  - continue ezetimibe and  Repatha   4. COPD mixed type ([place]) Breathing stable  - continue on Albuterol   5. Prediabetes Lab Results  Component Value Date   HGBA1C 6.0 (H) 06/29/2021  - Dietary modification and exercise at least 3 times per week for 30 minutes advised.  6. BPH with obstruction/lower urinary tract symptoms No obstruction  - continue tamsulosin   7. Gastroesophageal reflux disease without esophagitis H/H stable  Continue on Omeprazole    Family/ staff Communication: Reviewed plan of care with patient verbalized understanding   Labs/tests ordered: None   Next Appointment : 6 months for medical management of chronic issues.Fasting Labs prior to visit. 2 weeks for blood pressure check.    Sandrea Hughs, NP

## 2021-07-02 NOTE — Patient Instructions (Signed)
-    check Blood pressure at home and record on log provided and notify provider if B/p > 150/90   - Please Blood pressure log to visit in 2 weeks for evaluation   - Increase Valsartan from 80 mg to 160 mg tablet daily may take 2 tablet to use up the current medication

## 2021-07-13 ENCOUNTER — Telehealth: Payer: Self-pay

## 2021-07-13 NOTE — Telephone Encounter (Signed)
Discussed [name]'s response with Bethena Roys. They will call if BP gets over 150/90.

## 2021-07-13 NOTE — Telephone Encounter (Signed)
Blood pressure readings 100's/60's -130's/60's are within normal range.continue on the 80 mg tablet if he was off medication during visit. Notify provider if B/p > 150/90 .

## 2021-07-13 NOTE — Telephone Encounter (Signed)
Patient's wife called I reporting some blood pressure readings: 142-145/71 She says most days its upper 414'Q systolic 360/16 580/06 349/49  She states the patient did not double his dose and is still on the 80 mg because right before he was seen he had been off med for a few days.

## 2021-09-03 ENCOUNTER — Other Ambulatory Visit: Payer: Self-pay | Admitting: Internal Medicine

## 2021-11-10 ENCOUNTER — Ambulatory Visit (INDEPENDENT_AMBULATORY_CARE_PROVIDER_SITE_OTHER): Payer: Medicare HMO | Admitting: Family

## 2021-11-10 ENCOUNTER — Encounter: Payer: Self-pay | Admitting: Family

## 2021-11-10 DIAGNOSIS — Z0000 Encounter for general adult medical examination without abnormal findings: Secondary | ICD-10-CM | POA: Diagnosis not present

## 2021-11-10 NOTE — Progress Notes (Signed)
This service is provided via telemedicine  No vital signs collected/recorded due to the encounter was a telemedicine visit.   Location of patient (ex: home, work):  Home  Patient consents to a telephone visit:  Yes  Location of the provider (ex: office, home):  [employer].  Name of any referring provider:  Shifa Brisbon, Nelda Bucks, NP   Names of all persons participating in the telemedicine service and their role in the encounter:  Patient, Heriberto Antigua, Hooper, Kinsman, Webb Silversmith, NP.    Time spent on call: 8 minutes spent on the phone with Medical Assistant.      Subjective:   Thomas Reyes is a 81 y.o. male who presents for Medicare Annual/Subsequent preventive examination.  Review of Systems    Cardiac Risk Factors include: advanced age (>46mn, >>75women);dyslipidemia;hypertension;male gender     Objective:    There were no vitals filed for this visit. There is no height or weight on file to calculate BMI.     11/10/2021   11:18 AM 07/02/2021    9:26 AM 12/25/2020    9:22 AM 11/18/2020   11:29 AM 08/13/2020    9:36 AM 04/16/2020    9:53 AM 11/25/2019   11:33 AM  Advanced Directives  Does Patient Have a Medical Advance Directive? Yes Yes Yes Yes Yes Yes Yes  Type of Advance Directive Living will Living will [place] Living will Out of facility DNR (pink MOST or yellow form);Living will Living will  Does patient want to make changes to medical advance directive? No - Patient declined No - Patient declined No - Patient declined No - Patient declined No - Patient declined No - Patient declined No - Patient declined  Copy of [place] Chart?   Yes - validated most recent copy scanned in chart (See row information) Yes - validated most recent copy scanned in chart (See row information)       Current Medications (verified) Outpatient Encounter Medications as of 11/10/2021   Medication Sig   albuterol (VENTOLIN HFA) 108 (90 Base) MCG/ACT inhaler Inhale 2 puffs into the lungs every 6 (six) hours as needed for wheezing or shortness of breath.   ezetimibe (ZETIA) 10 MG tablet TAKE 1 TABLET (10 MG TOTAL) BY MOUTH DAILY.   nitroGLYCERIN (NITROSTAT) 0.4 MG SL tablet Place 1 tablet (0.4 mg total) under the tongue every 5 (five) minutes as needed for chest pain (max 3 doses).   omeprazole (PRILOSEC) 20 MG capsule TAKE 1 CAPSULE EVERY DAY   REPATHA SURECLICK 1220MG/ML SOAJ INJECT 1 DOSE SUBCUTANEOUSLY EVERY 14 DAYS   tamsulosin (FLOMAX) 0.4 MG CAPS capsule Take 1 capsule (0.4 mg total) by mouth daily.   valsartan (DIOVAN) 160 MG tablet Take 1 tablet (160 mg total) by mouth daily.   No facility-administered encounter medications on file as of 11/10/2021.    Allergies (verified) Zocor [simvastatin], Ace inhibitors, Lipitor [atorvastatin], and Pravachol [pravastatin sodium]   History: 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   Chest pain, unspecified 08/20/2012   Chest pain, unspecified 12/26/2011   Concussion, unspecified 10/30/1994   COPD (chronic obstructive pulmonary disease) ([place] 02/24/2006   Corns and callosities 04/11/2007   Dermatophytosis of the body    Disturbance of skin sensation    Diverticulosis of colon (without mention of hemorrhage) 06/14/2010   Edema of male genital organs  08/20/2012   Elevated blood pressure reading without diagnosis of hypertension 06/14/2010   Encounter for long-term (current) use of other medications    Hypercholesterolemia    Hypertrophy of prostate without urinary obstruction and other lower urinary tract symptoms (LUTS)    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   Lumbago 05/11/2009   Nocturia 08/20/2012   Other and unspecified hyperlipidemia 02/24/2006   Pain in joint, ankle and foot 04/11/2007   Pterygium, unspecified 03/01/2006   Rash  and other nonspecific skin eruption 12/28/1999   Seborrheic dermatitis, unspecified 03/21/2001   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   Tobacco use disorder    Ulcer of other part of foot 03/16/2007   Unspecified constipation 05/11/2009   Urinary tract infection, site not specified 05/11/2009   Ventral hernia, unspecified, without mention of obstruction or gangrene    Past Surgical History:  Procedure Laterality Date   ELBOW SURGERY Right    EYE SURGERY     Cataract Removal   FRACTURE SURGERY  1986   right hip, right elbow   KNEE SURGERY Right 01/2007   knee cap injury   SKIN CANCER EXCISION  07/2014   Back and head    TONSILLECTOMY  1948   Family History  Problem Relation Age of Onset   Diabetes Mother    Hypertension Mother    Cancer Father    Social History   Socioeconomic History   Marital status: Married    Spouse name: Not on file   Number of children: Not on file   Years of education: Not on file   Highest education level: Not on file  Occupational History   Not on file  Tobacco Use   Smoking status: Former    Types: Cigarettes    Quit date: 06/09/2010    Years since quitting: 11.4   Smokeless tobacco: Never  Vaping Use   Vaping Use: Never used  Substance and Sexual Activity   Alcohol use: No    Alcohol/week: 0.0 standard drinks   Drug use: No   Sexual activity: Not Currently  Other Topics Concern   Not on file  Social History Narrative   Not on file   Social Determinants of Health   Financial Resource Strain: Not on file  Food Insecurity: Not on file  Transportation Needs: Not on file  Physical Activity: Not on file  Stress: Not on file  Social Connections: Not on file    Tobacco Counseling Counseling given: Not Answered   Clinical Intake:  Pre-visit preparation completed: No  Pain : No/denies pain     BMI - recorded: 20.33  How often do you need to have someone help you when you read  instructions, pamphlets, or other written materials from your doctor or pharmacy?: 1 - Never What is the last grade level you completed in school?: 12 grade  Diabetic?No   Interpreter Needed?: No      Activities of Daily Living    11/10/2021   11:26 AM 11/18/2020    2:42 PM  In your present state of health, do you have any difficulty performing the following activities:  Hearing? 1 1  Comment wears hearing aids   Vision?  0  Difficulty concentrating or making decisions? 0 0  Walking or climbing stairs? 0 0  Dressing or bathing? 0 0  Doing errands, shopping? 0 0  Preparing Food and eating ? N N  Using the Toilet? N N  In the  past six months, have you accidently leaked urine? N N  Do you have problems with loss of bowel control? N N  Managing your Medications? N N  Managing your Finances? N N  Housekeeping or managing your Housekeeping? N N    Patient Care Team: Birtie Fellman, Nelda Bucks, NP as PCP - General (Family Medicine) Warden Fillers, MD as Consulting Physician (Ophthalmology)  Indicate any recent Medical Services you may have received from other than [facility] providers in the past year (date may be approximate).     Assessment:   This is a routine wellness examination for [place].  Hearing/Vision screen Hearing Screening - Comments:: No Hearing Concerns. Patient wears hearing aids in both ears. Vision Screening - Comments:: No Vision Concern. Patient wears prescription glasses. Patient last eye exam January 2023.  Dietary issues and exercise activities discussed: Current Exercise Habits: The patient has a physically strenuous job, but has no regular exercise apart from work., Exercise limited by: None identified   Goals Addressed             This Visit's Progress    <enter goal here>   On track    Starting 06/27/16, I will maintain my current lifestyle.         Depression Screen    11/10/2021   11:13 AM 11/18/2020   11:25 AM 04/16/2020    9:52 AM 10/14/2019    10:02 AM 10/08/2019   10:54 AM 05/27/2019    9:06 AM 11/19/2018    8:54 AM  PHQ 2/9 Scores  PHQ - 2 Score 0 0 0 0 0 0 0    Fall Risk    11/10/2021   11:13 AM 07/02/2021    9:26 AM 12/25/2020    9:22 AM 11/18/2020   11:25 AM 04/16/2020    9:52 AM  Fall Risk   Falls in the past year? 0 0 0 0 0  Number falls in past yr: 0 0 0 0 0  Injury with Fall? 0 0 0 0 0  Risk for fall due to : No Fall Risks No Fall Risks No Fall Risks    Follow up Falls evaluation completed Falls evaluation completed Falls evaluation completed      FALL RISK PREVENTION PERTAINING TO THE HOME:  Any stairs in or around the home? No  If so, are there any without handrails? No  Home free of loose throw rugs in walkways, pet beds, electrical cords, etc? No  Adequate lighting in your home to reduce risk of falls? Yes   ASSISTIVE DEVICES UTILIZED TO PREVENT FALLS:  Life alert? No  Use of a cane, walker or w/c? No  Grab bars in the bathroom? No  Shower chair or bench in shower? No  Elevated toilet seat or a handicapped toilet? No   TIMED UP AND GO:  Was the test performed? No .  Length of time to ambulate 10 feet: N/A sec.   Gait slow and steady without use of assistive device  Cognitive Function:    09/04/2017    9:46 AM 06/27/2016    3:36 PM 04/03/2015    8:52 AM  MMSE - Mini Mental State Exam  Orientation to time '5 5 4  '$ Orientation to Place '5 5 5  '$ Registration '3 3 3  '$ Attention/ Calculation '4 4 5  '$ Recall '1 1 2  '$ Language- name 2 objects '2 2 2  '$ Language- repeat '1 1 1  '$ Language- follow 3 step command '3 3 3  '$ Language- read &  follow direction '1 1 1  '$ Write a sentence '1 1 1  '$ Copy design 1 0 1  Total score '27 26 28        '$ 11/10/2021   11:15 AM 11/18/2020   11:26 AM 10/08/2019   10:55 AM 10/05/2018    8:46 AM  6CIT Screen  What Year? 0 points 0 points 0 points 0 points  What month? 0 points 0 points 0 points 0 points  What time? 0 points 0 points 0 points 0 points  Count back from 20 0 points 0 points 0  points 0 points  Months in reverse 0 points 2 points 0 points 0 points  Repeat phrase 6 points 0 points 4 points 0 points  Total Score 6 points 2 points 4 points 0 points    Immunizations Immunization History  Administered Date(s) Administered   Fluad Quad(high Dose 65+) 05/27/2019, 03/05/2020, 07/02/2021   Influenza, High Dose Seasonal PF 03/09/2017, 05/21/2018   Influenza,inj,Quad PF,6+ Mos 03/31/2014, 04/03/2015, 02/25/2016   PFIZER(Purple Top)SARS-COV-2 Vaccination 07/21/2019, 11/06/2019   Pneumococcal Conjugate-13 07/18/2013   Pneumococcal Polysaccharide-23 10/02/2014   Tdap 06/10/2005, 12/03/2013    TDAP status: Up to date  Flu Vaccine status: Up to date  Pneumococcal vaccine status: Up to date  Covid-19 vaccine status: Declined, Education has been provided regarding the importance of this vaccine but patient still declined. Advised may receive this vaccine at local pharmacy or Health Dept.or vaccine clinic. Aware to provide a copy of the vaccination record if obtained from local pharmacy or Health Dept. Verbalized acceptance and understanding.  Qualifies for Shingles Vaccine? Yes   Zostavax completed No   Shingrix Completed?: No.    Education has been provided regarding the importance of this vaccine. Patient has been advised to call insurance company to determine out of pocket expense if they have not yet received this vaccine. Advised may also receive vaccine at local pharmacy or Health Dept. Verbalized acceptance and understanding.  Screening Tests Health Maintenance  Topic Date Due   Zoster Vaccines- Shingrix (1 of 2) Never done   INFLUENZA VACCINE  01/04/2022   TETANUS/TDAP  12/04/2023   Pneumonia Vaccine 80+ Years old  Completed   HPV VACCINES  Aged Out   COVID-19 Vaccine  Discontinued    Health Maintenance  Health Maintenance Due  Topic Date Due   Zoster Vaccines- Shingrix (1 of 2) Never done    Colorectal cancer screening: No longer required.   Lung  Cancer Screening: (Low Dose CT Chest recommended if Age 31-80 years, 30 pack-year currently smoking OR have quit w/in 15years.) does not qualify.   Lung Cancer Screening Referral: No   Additional Screening:  Hepatitis C Screening: does qualify; Completed Yes   Vision Screening: Recommended annual ophthalmology exams for early detection of glaucoma and other disorders of the eye. Is the patient up to date with their annual eye exam?  Yes  Who is the provider or what is the name of the office in which the patient attends annual eye exams? Dr.Groat  If pt is not established with a provider, would they like to be referred to a provider to establish care? No .   Dental Screening: Recommended annual dental exams for proper oral hygiene  Community Resource Referral / Chronic Care Management: CRR required this visit?  No   CCM required this visit?  No      Plan:     I have personally reviewed and noted the following in the patient's chart:  Medical and social history Use of alcohol, tobacco or illicit drugs  Current medications and supplements including opioid prescriptions. Patient is not currently taking opioid prescriptions. Functional ability and status Nutritional status Physical activity Advanced directives List of other physicians Hospitalizations, surgeries, and ER visits in previous 12 months Vitals Screenings to include cognitive, depression, and falls Referrals and appointments  In addition, I have reviewed and discussed with patient certain preventive protocols, quality metrics, and best practice recommendations. A written personalized care plan for preventive services as well as general preventive health recommendations were provided to patient.     Sandrea Hughs, NP   11/10/2021   Nurse Notes: Due for Shingles vaccine went to pharmacy last year and co-pay was $ 200 so he did not get vaccine but will check with pharmacist again.

## 2021-11-10 NOTE — Patient Instructions (Signed)
Thomas Reyes , Thank you for taking time to come for your Medicare Wellness Visit. I appreciate your ongoing commitment to your health goals. Please review the following plan we discussed and let me know if I can assist you in the future.   Screening recommendations/referrals: Colonoscopy : N/A  Recommended yearly ophthalmology/optometry visit for glaucoma screening and checkup Recommended yearly dental visit for hygiene and checkup  Vaccinations: Influenza vaccine : Up to date  Pneumococcal vaccine : Up to date  Tdap vaccine : Up to date  Shingles vaccine Due.please get shingles vaccine at your pharmacy     Advanced directives: yes   Conditions/risks identified: Advance age male > 24 yrs,dyslipidemia,Hypertension and male gender   Next appointment: 1 year   Preventive Care 22 Years and Older, Male Preventive care refers to lifestyle choices and visits with your health care provider that can promote health and wellness. What does preventive care include? A yearly physical exam. This is also called an annual well check. Dental exams once or twice a year. Routine eye exams. Ask your health care provider how often you should have your eyes checked. Personal lifestyle choices, including: Daily care of your teeth and gums. Regular physical activity. Eating a healthy diet. Avoiding tobacco and drug use. Limiting alcohol use. Practicing safe sex. Taking low doses of aspirin every day. Taking vitamin and mineral supplements as recommended by your health care provider. What happens during an annual well check? The services and screenings done by your health care provider during your annual well check will depend on your age, overall health, lifestyle risk factors, and family history of disease. Counseling  Your health care provider may ask you questions about your: Alcohol use. Tobacco use. Drug use. Emotional well-being. Home and relationship well-being. Sexual activity. Eating  habits. History of falls. Memory and ability to understand (cognition). Work and work Statistician. Screening  You may have the following tests or measurements: Height, weight, and BMI. Blood pressure. Lipid and cholesterol levels. These may be checked every 5 years, or more frequently if you are over 59 years old. Skin check. Lung cancer screening. You may have this screening every year starting at age 24 if you have a 30-pack-year history of smoking and currently smoke or have quit within the past 15 years. Fecal occult blood test (FOBT) of the stool. You may have this test every year starting at age 74. Flexible sigmoidoscopy or colonoscopy. You may have a sigmoidoscopy every 5 years or a colonoscopy every 10 years starting at age 33. Prostate cancer screening. Recommendations will vary depending on your family history and other risks. Hepatitis C blood test. Hepatitis B blood test. Sexually transmitted disease (STD) testing. Diabetes screening. This is done by checking your blood sugar (glucose) after you have not eaten for a while (fasting). You may have this done every 1-3 years. Abdominal aortic aneurysm (AAA) screening. You may need this if you are a current or former smoker. Osteoporosis. You may be screened starting at age 68 if you are at high risk. Talk with your health care provider about your test results, treatment options, and if necessary, the need for more tests. Vaccines  Your health care provider may recommend certain vaccines, such as: Influenza vaccine. This is recommended every year. Tetanus, diphtheria, and acellular pertussis (Tdap, Td) vaccine. You may need a Td booster every 10 years. Zoster vaccine. You may need this after age 53. Pneumococcal 13-valent conjugate (PCV13) vaccine. One dose is recommended after age 6. Pneumococcal polysaccharide (  PPSV23) vaccine. One dose is recommended after age 61. Talk to your health care provider about which screenings and  vaccines you need and how often you need them. This information is not intended to replace advice given to you by your health care provider. Make sure you discuss any questions you have with your health care provider. Document Released: 06/19/2015 Document Revised: 02/10/2016 Document Reviewed: 03/24/2015 Elsevier Interactive Patient Education  2017 [place] Prevention in the Home Falls can cause injuries. They can happen to people of all ages. There are many things you can do to make your home safe and to help prevent falls. What can I do on the outside of my home? Regularly fix the edges of walkways and driveways and fix any cracks. Remove anything that might make you trip as you walk through a door, such as a raised step or threshold. Trim any bushes or trees on the path to your home. Use bright outdoor lighting. Clear any walking paths of anything that might make someone trip, such as rocks or tools. Regularly check to see if handrails are loose or broken. Make sure that both sides of any steps have handrails. Any raised decks and porches should have guardrails on the edges. Have any leaves, snow, or ice cleared regularly. Use sand or salt on walking paths during winter. Clean up any spills in your garage right away. This includes oil or grease spills. What can I do in the bathroom? Use night lights. Install grab bars by the toilet and in the tub and shower. Do not use towel bars as grab bars. Use non-skid mats or decals in the tub or shower. If you need to sit down in the shower, use a plastic, non-slip stool. Keep the floor dry. Clean up any water that spills on the floor as soon as it happens. Remove soap buildup in the tub or shower regularly. Attach bath mats securely with double-sided non-slip rug tape. Do not have throw rugs and other things on the floor that can make you trip. What can I do in the bedroom? Use night lights. Make sure that you have a light by your  bed that is easy to reach. Do not use any sheets or blankets that are too big for your bed. They should not hang down onto the floor. Have a firm chair that has side arms. You can use this for support while you get dressed. Do not have throw rugs and other things on the floor that can make you trip. What can I do in the kitchen? Clean up any spills right away. Avoid walking on wet floors. Keep items that you use a lot in easy-to-reach places. If you need to reach something above you, use a strong step stool that has a grab bar. Keep electrical cords out of the way. Do not use floor polish or wax that makes floors slippery. If you must use wax, use non-skid floor wax. Do not have throw rugs and other things on the floor that can make you trip. What can I do with my stairs? Do not leave any items on the stairs. Make sure that there are handrails on both sides of the stairs and use them. Fix handrails that are broken or loose. Make sure that handrails are as long as the stairways. Check any carpeting to make sure that it is firmly attached to the stairs. Fix any carpet that is loose or worn. Avoid having throw rugs at the top or  bottom of the stairs. If you do have throw rugs, attach them to the floor with carpet tape. Make sure that you have a light switch at the top of the stairs and the bottom of the stairs. If you do not have them, ask someone to add them for you. What else can I do to help prevent falls? Wear shoes that: Do not have high heels. Have rubber bottoms. Are comfortable and fit you well. Are closed at the toe. Do not wear sandals. If you use a stepladder: Make sure that it is fully opened. Do not climb a closed stepladder. Make sure that both sides of the stepladder are locked into place. Ask someone to hold it for you, if possible. Clearly mark and make sure that you can see: Any grab bars or handrails. First and last steps. Where the edge of each step is. Use tools that  help you move around (mobility aids) if they are needed. These include: Canes. Walkers. Scooters. Crutches. Turn on the lights when you go into a dark area. Replace any light bulbs as soon as they burn out. Set up your furniture so you have a clear path. Avoid moving your furniture around. If any of your floors are uneven, fix them. If there are any pets around you, be aware of where they are. Review your medicines with your doctor. Some medicines can make you feel dizzy. This can increase your chance of falling. Ask your doctor what other things that you can do to help prevent falls. This information is not intended to replace advice given to you by your health care provider. Make sure you discuss any questions you have with your health care provider. Document Released: 03/19/2009 Document Revised: 10/29/2015 Document Reviewed: 06/27/2014 Elsevier Interactive Patient Education  2017 [employer].

## 2021-11-17 ENCOUNTER — Encounter: Payer: Medicare HMO | Admitting: Family

## 2021-11-18 ENCOUNTER — Encounter: Payer: Medicare HMO | Admitting: Family

## 2021-12-01 ENCOUNTER — Other Ambulatory Visit: Payer: Self-pay | Admitting: Family

## 2021-12-23 DIAGNOSIS — H10413 Chronic giant papillary conjunctivitis, bilateral: Secondary | ICD-10-CM | POA: Diagnosis not present

## 2021-12-23 DIAGNOSIS — Z961 Presence of intraocular lens: Secondary | ICD-10-CM | POA: Diagnosis not present

## 2021-12-23 DIAGNOSIS — H43811 Vitreous degeneration, right eye: Secondary | ICD-10-CM | POA: Diagnosis not present

## 2021-12-23 DIAGNOSIS — H04123 Dry eye syndrome of bilateral lacrimal glands: Secondary | ICD-10-CM | POA: Diagnosis not present

## 2021-12-23 DIAGNOSIS — H11042 Peripheral pterygium, stationary, left eye: Secondary | ICD-10-CM | POA: Diagnosis not present

## 2021-12-23 DIAGNOSIS — H353131 Nonexudative age-related macular degeneration, bilateral, early dry stage: Secondary | ICD-10-CM | POA: Diagnosis not present

## 2021-12-29 ENCOUNTER — Other Ambulatory Visit: Payer: Self-pay | Admitting: Family

## 2021-12-30 NOTE — Patient Instructions (Signed)
Please contact your local pharmacy, previous provider, or insurance carrier for vaccine/immunization records. Ensure that any procedures done outside of [facility] are faxed to us [phone] or you can sign release of records form at the front desk to keep your medical record updated.   ?

## 2021-12-31 ENCOUNTER — Encounter: Payer: Self-pay | Admitting: Family

## 2021-12-31 ENCOUNTER — Ambulatory Visit (INDEPENDENT_AMBULATORY_CARE_PROVIDER_SITE_OTHER): Payer: Medicare HMO | Admitting: Family

## 2021-12-31 VITALS — BP 118/60 | HR 48 | Temp 96.7°F | Resp 16 | Ht 67.0 in | Wt 131.0 lb

## 2021-12-31 DIAGNOSIS — E7800 Pure hypercholesterolemia, unspecified: Secondary | ICD-10-CM

## 2021-12-31 DIAGNOSIS — Z23 Encounter for immunization: Secondary | ICD-10-CM

## 2021-12-31 DIAGNOSIS — N1831 Chronic kidney disease, stage 3a: Secondary | ICD-10-CM

## 2021-12-31 DIAGNOSIS — R7303 Prediabetes: Secondary | ICD-10-CM

## 2021-12-31 DIAGNOSIS — K219 Gastro-esophageal reflux disease without esophagitis: Secondary | ICD-10-CM | POA: Diagnosis not present

## 2021-12-31 DIAGNOSIS — J449 Chronic obstructive pulmonary disease, unspecified: Secondary | ICD-10-CM

## 2021-12-31 DIAGNOSIS — H903 Sensorineural hearing loss, bilateral: Secondary | ICD-10-CM | POA: Diagnosis not present

## 2021-12-31 DIAGNOSIS — R001 Bradycardia, unspecified: Secondary | ICD-10-CM

## 2021-12-31 DIAGNOSIS — I10 Essential (primary) hypertension: Secondary | ICD-10-CM

## 2021-12-31 MED ORDER — VALSARTAN 80 MG PO TABS: 80.0000 mg | ORAL_TABLET | Freq: Every day | ORAL | 0 refills | Status: DC

## 2021-12-31 NOTE — Progress Notes (Signed)
Provider: Marlowe Sax FNP-C   Rosaleah Person, Nelda Bucks, NP  Patient Care Team: Darreon Lutes, Nelda Bucks, NP as PCP - General (Family Medicine) Warden Fillers, MD as Consulting Physician (Ophthalmology)  Extended Emergency Contact Information Primary Emergency Contact: [facility] Address: [place], [place] [address] [place] Phone: [phone] Relation: Spouse  Code Status:  Full Code  Goals of care: Advanced Directive information    12/31/2021    9:03 AM  Advanced Directives  Does Patient Have a Medical Advance Directive? Yes  Type of Advance Directive Living will  Does patient want to make changes to medical advance directive? No - Patient declined     Chief Complaint  Patient presents with  . Medical Management of Chronic Issues    6 month follow up.  . Immunizations    Discuss the need for Shingrix vaccine.    HPI:  Pt is a 81 y.o. male seen today for 6 months medical management of chronic diseases.   Hypertension - Valsartan increased from 120 mg to 160 mg tablet daily on previous visit due to high blood readings but states B/p at home ranging in the 120's/70's - 130's/70's so he did not start on 160 mg tablet.Has been taking 80 mg tablet daily.sometimes has chest pain once in a while but has not had to take his Nitro.Recently had all teeth removed so has not taken Asprin over a month but plans to restart. He denies any headache,dizziness,vision changes,fatigue,chest tightness,palpitation or shortness of breath.     Hyperlipidemia - on Rebatha previous LDL was at goal.Has not been eating much since teeth were removed.Awaiting for dentures.  COPD - has not required his albuterol.She denies any fever,chills,cough,fatigue,body aches,runny nose,chest tightness,chest pain,palpitation or shortness of breath.  Prediabetes - previous Hgb A1C was slightly high 6.0 states not eating much as above.stys busy working in the  [employer].   GERD - Omeprazole effective has not had any symptoms.   Due for second PNA 23 agrees to get vaccine today.     Past Medical History:  Diagnosis Date  . Allergic rhinitis, cause unspecified 09/08/2006  . Carpal tunnel syndrome 03/16/2009  . Chest pain, unspecified 08/20/2012  . Chest pain, unspecified 12/26/2011  . Concussion, unspecified 10/30/1994  . COPD (chronic obstructive pulmonary disease) ([place]) 02/24/2006  . Corns and callosities 04/11/2007  . Dermatophytosis of the body   . Disturbance of skin sensation   . Diverticulosis of colon (without mention of hemorrhage) 06/14/2010  . Edema of male genital organs 08/20/2012  . Elevated blood pressure reading without diagnosis of hypertension 06/14/2010  . Encounter for long-term (current) use of other medications   . Hypercholesterolemia   . Hypertrophy of prostate without urinary obstruction and other lower urinary tract symptoms (LUTS)   . 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 Lumbago 05/11/2009  . Nocturia 08/20/2012  . Other and unspecified hyperlipidemia 02/24/2006  . Pain in joint, ankle and foot 04/11/2007  . Pterygium, unspecified 03/01/2006  . Rash and other nonspecific skin eruption 12/28/1999  . Seborrheic dermatitis, unspecified 03/21/2001  . 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 Tobacco use disorder   . Ulcer of other part of foot 03/16/2007  . Unspecified constipation 05/11/2009  . Urinary tract infection, site not specified 05/11/2009  . Ventral hernia, unspecified, without mention of obstruction or gangrene    Past Surgical History:  Procedure Laterality Date  . ELBOW SURGERY Right   . EYE SURGERY     Cataract Removal  . FRACTURE SURGERY  1986   right hip, right elbow  . KNEE SURGERY Right 01/2007   knee cap injury  . SKIN CANCER EXCISION  07/2014   Back and head   . TONSILLECTOMY  1948    Allergies  Allergen Reactions  . Zocor  [Simvastatin] Shortness Of Breath    Sob and itching  . Ace Inhibitors Cough    Tolerating ARB instead  . Lipitor [Atorvastatin]     Muscle and bone pain  . Pravachol [Pravastatin Sodium] Other (See Comments)    myalgias    Allergies as of 12/31/2021       Reactions   Zocor [simvastatin] Shortness Of Breath   Sob and itching   Ace Inhibitors Cough   Tolerating ARB instead   Lipitor [atorvastatin]    Muscle and bone pain   Pravachol [pravastatin Sodium] Other (See Comments)   myalgias        Medication List        Accurate as of December 31, 2021  9:13 AM. If you have any questions, ask your nurse or doctor.          albuterol 108 (90 Base) MCG/ACT inhaler Commonly known as: VENTOLIN HFA Inhale 2 puffs into the lungs every 6 (six) hours as needed for wheezing or shortness of breath.   ezetimibe 10 MG tablet Commonly known as: ZETIA TAKE 1 TABLET (10 MG TOTAL) BY MOUTH DAILY.   nitroGLYCERIN 0.4 MG SL tablet Commonly known as: NITROSTAT Place 1 tablet (0.4 mg total) under the tongue every 5 (five) minutes as needed for chest pain (max 3 doses).   omeprazole 20 MG capsule Commonly known as: PRILOSEC TAKE 1 CAPSULE EVERY DAY   Repatha SureClick 270 MG/ML Soaj Generic drug: Evolocumab INJECT 1 DOSE SUBCUTANEOUSLY EVERY 14 DAYS   tamsulosin 0.4 MG Caps capsule Commonly known as: FLOMAX TAKE 1 CAPSULE EVERY DAY   valsartan 160 MG tablet Commonly known as: DIOVAN Take 1 tablet (160 mg total) by mouth daily.        Review of Systems  Constitutional:  Negative for appetite change, chills, fatigue, fever and unexpected weight change.  HENT:  Positive for dental problem and hearing loss. Negative for congestion, ear discharge, ear pain, facial swelling, nosebleeds, postnasal drip, rhinorrhea, sinus pressure, sinus pain, sneezing, sore throat, tinnitus and trouble swallowing.        Did not bring hearing aids in today  S/p all teeth extraction   Eyes:  Negative  for pain, discharge, redness, itching and visual disturbance.  Respiratory:  Negative for cough, chest tightness, shortness of breath and wheezing.   Cardiovascular:  Negative for chest pain, palpitations and leg swelling.  Gastrointestinal:  Negative for abdominal distention, abdominal pain, blood in stool, constipation, diarrhea, nausea and vomiting.  Endocrine: Negative for cold intolerance, heat intolerance, polydipsia, polyphagia and polyuria.  Genitourinary:  Negative for difficulty urinating, dysuria, flank pain, frequency and urgency.  Musculoskeletal:  Negative for arthralgias, back pain, gait problem, joint swelling, myalgias, neck pain and neck stiffness.  Skin:  Negative for color change, pallor, rash and wound.  Neurological:  Negative for dizziness, syncope, speech difficulty, weakness, light-headedness, numbness and headaches.  Hematological:  Does not bruise/bleed easily.  Psychiatric/Behavioral:  Negative for agitation, behavioral problems, confusion, hallucinations, self-injury, sleep disturbance and suicidal ideas. The patient is not nervous/anxious.     Immunization History  Administered Date(s) Administered  . Fluad Quad(high Dose 65+) 05/27/2019, 03/05/2020, 07/02/2021  . Influenza, High Dose Seasonal PF 03/09/2017, 05/21/2018  . Influenza,inj,Quad PF,6+ Mos 03/31/2014, 04/03/2015, 02/25/2016  . PFIZER(Purple Top)SARS-COV-2 Vaccination 07/21/2019, 11/06/2019  . Pneumococcal Conjugate-13 07/18/2013  . Pneumococcal Polysaccharide-23 10/02/2014  . Tdap 06/10/2005, 12/03/2013   Pertinent  Health Maintenance Due  Topic Date Due  . INFLUENZA VACCINE  01/04/2022      11/18/2020   11:25 AM 12/25/2020    9:22 AM 07/02/2021    9:26 AM 11/10/2021   11:13 AM 12/31/2021    9:03 AM  Fall Risk  Falls in the past year? 0 0 0 0 0  Was there an injury with Fall? 0 0 0 0 0  Fall Risk Category Calculator 0 0 0 0 0  Fall Risk Category Low Low Low Low Low  Patient Fall Risk Level Low  fall risk Low fall risk Low fall risk Low fall risk Low fall risk  Patient at Risk for Falls Due to  No Fall Risks No Fall Risks No Fall Risks No Fall Risks  Fall risk Follow up  Falls evaluation completed Falls evaluation completed Falls evaluation completed Falls evaluation completed   Functional Status Survey:    Vitals:   12/31/21 0858  BP: 118/60  Pulse: (!) 48  Resp: 16  Temp: (!) 96.7 F (35.9 C)  SpO2: 98%  Weight: 131 lb (59.4 kg)  Height: '5\' 7"'$  (1.702 m)   Body mass index is 20.52 kg/m. Physical Exam Vitals reviewed.  Constitutional:      General: He is not in acute distress.    Appearance: Normal appearance. He is normal weight. He is not ill-appearing or diaphoretic.  HENT:     Head: Normocephalic.     Right Ear: Tympanic membrane, ear canal and external ear normal. There is no impacted cerumen.     Left Ear: Tympanic membrane, ear canal and external ear normal. There is no impacted cerumen.     Nose: Nose normal. No congestion or rhinorrhea.     Mouth/Throat:     Mouth: Mucous membranes are moist.     Pharynx: Oropharynx is clear. No oropharyngeal exudate or posterior oropharyngeal erythema.  Eyes:     General: No scleral icterus.       Right eye: No discharge.        Left eye: No discharge.     Extraocular Movements: Extraocular movements intact.     Conjunctiva/sclera: Conjunctivae normal.     Pupils: Pupils are equal, round, and reactive to light.  Neck:     Vascular: No carotid bruit.  Cardiovascular:     Rate and Rhythm: Regular rhythm. Bradycardia present.     Pulses: Normal pulses.     Heart sounds: Normal heart sounds. No murmur heard.    No friction rub. No gallop.  Pulmonary:     Effort: Pulmonary effort is normal. No respiratory distress.     Breath sounds: Normal breath sounds. No wheezing, rhonchi or rales.  Chest:     Chest wall: No tenderness.  Abdominal:     General: Bowel sounds are normal. There is no distension.     Palpations:  Abdomen is soft. There is no mass.     Tenderness: There is no abdominal tenderness. There is no right CVA tenderness, left CVA tenderness, guarding or rebound.  Musculoskeletal:        General: No swelling or tenderness. Normal range of motion.     Cervical  back: Normal range of motion. No rigidity or tenderness.     Right lower leg: No edema.     Left lower leg: No edema.  Lymphadenopathy:     Cervical: No cervical adenopathy.  Skin:    General: Skin is warm and dry.     Coloration: Skin is not pale.     Findings: No bruising, erythema, lesion or rash.  Neurological:     Mental Status: He is alert and oriented to person, place, and time.     Cranial Nerves: No cranial nerve deficit.     Sensory: No sensory deficit.     Motor: No weakness.     Coordination: Coordination normal.     Gait: Gait normal.  Psychiatric:        Mood and Affect: Mood normal.        Speech: Speech normal.        Behavior: Behavior normal.        Thought Content: Thought content normal.        Judgment: Judgment normal.    Labs reviewed: Recent Labs    06/29/21 0837  NA 138  K 4.3  CL 104  CO2 30  GLUCOSE 94  BUN 13  CREATININE 1.34*  CALCIUM 8.9   Recent Labs    06/29/21 0837  AST 15  ALT 7*  BILITOT 0.6  PROT 6.5   Recent Labs    06/29/21 0837  WBC 4.9  NEUTROABS 2,999  HGB 14.4  HCT 43.8  MCV 89.6  PLT 240   Lab Results  Component Value Date   TSH 3.07 06/29/2021   Lab Results  Component Value Date   HGBA1C 6.0 (H) 06/29/2021   Lab Results  Component Value Date   CHOL 132 06/29/2021   HDL 51 06/29/2021   LDLCALC 61 06/29/2021   TRIG 115 06/29/2021   CHOLHDL 2.6 06/29/2021    Significant Diagnostic Results in last 30 days:  No results found.  Assessment/Plan There are no diagnoses linked to this encounter.   Family/ staff Communication: Reviewed plan of care with patient  Labs/tests ordered: None   Next Appointment :   Sandrea Hughs, NP

## 2022-01-01 LAB — CBC WITH DIFFERENTIAL/PLATELET
Absolute Monocytes: 392 cells/uL (ref 200–950)
Basophils Absolute: 21 cells/uL (ref 0–200)
Basophils Relative: 0.4 %
Eosinophils Absolute: 159 cells/uL (ref 15–500)
Eosinophils Relative: 3 %
HCT: 43.3 % (ref 38.5–50.0)
Hemoglobin: 14.9 g/dL (ref 13.2–17.1)
Lymphs Abs: 1214 cells/uL (ref 850–3900)
MCH: 30.3 pg (ref 27.0–33.0)
MCHC: 34.4 g/dL (ref 32.0–36.0)
MCV: 88 fL (ref 80.0–100.0)
MPV: 10.3 fL (ref 7.5–12.5)
Monocytes Relative: 7.4 %
Neutro Abs: 3514 cells/uL (ref 1500–7800)
Neutrophils Relative %: 66.3 %
Platelets: 221 10*3/uL (ref 140–400)
RBC: 4.92 10*6/uL (ref 4.20–5.80)
RDW: 12.8 % (ref 11.0–15.0)
Total Lymphocyte: 22.9 %
WBC: 5.3 10*3/uL (ref 3.8–10.8)

## 2022-01-01 LAB — COMPLETE METABOLIC PANEL WITH GFR
AG Ratio: 1.3 (calc) (ref 1.0–2.5)
ALT: 10 U/L (ref 9–46)
AST: 18 U/L (ref 10–35)
Albumin: 4 g/dL (ref 3.6–5.1)
Alkaline phosphatase (APISO): 54 U/L (ref 35–144)
BUN/Creatinine Ratio: 9 (calc) (ref 6–22)
BUN: 14 mg/dL (ref 7–25)
CO2: 27 mmol/L (ref 20–32)
Calcium: 9.5 mg/dL (ref 8.6–10.3)
Chloride: 100 mmol/L (ref 98–110)
Creat: 1.58 mg/dL — ABNORMAL HIGH (ref 0.70–1.22)
Globulin: 3 g/dL (calc) (ref 1.9–3.7)
Glucose, Bld: 88 mg/dL (ref 65–99)
Potassium: 5.2 mmol/L (ref 3.5–5.3)
Sodium: 135 mmol/L (ref 135–146)
Total Bilirubin: 0.7 mg/dL (ref 0.2–1.2)
Total Protein: 7 g/dL (ref 6.1–8.1)
eGFR: 44 mL/min/{1.73_m2} — ABNORMAL LOW (ref >=60)

## 2022-01-01 LAB — LIPID PANEL
Cholesterol: 157 mg/dL (ref <200)
HDL: 65 mg/dL (ref >=40)
LDL Cholesterol (Calc): 75 mg/dL (calc)
Non-HDL Cholesterol (Calc): 92 mg/dL (calc) (ref <130)
Total CHOL/HDL Ratio: 2.4 (calc) (ref <5.0)
Triglycerides: 86 mg/dL (ref <150)

## 2022-01-01 LAB — TSH: TSH: 2.69 mIU/L (ref 0.40–4.50)

## 2022-01-01 LAB — HEMOGLOBIN A1C
Hgb A1c MFr Bld: 5.9 % of total Hgb — ABNORMAL HIGH (ref <5.7)
Mean Plasma Glucose: 123 mg/dL
eAG (mmol/L): 6.8 mmol/L

## 2022-01-04 DIAGNOSIS — H524 Presbyopia: Secondary | ICD-10-CM | POA: Diagnosis not present

## 2022-01-06 ENCOUNTER — Other Ambulatory Visit: Payer: Self-pay

## 2022-01-06 DIAGNOSIS — I10 Essential (primary) hypertension: Secondary | ICD-10-CM

## 2022-01-06 MED ORDER — VALSARTAN 80 MG PO TABS: 80.0000 mg | ORAL_TABLET | Freq: Every day | ORAL | 1 refills | Status: DC

## 2022-01-22 ENCOUNTER — Ambulatory Visit
Admission: EM | Admit: 2022-01-22 | Discharge: 2022-01-22 | Discharge status: Against Medical Advice | Payer: Medicare HMO

## 2022-01-22 DIAGNOSIS — S81011A Laceration without foreign body, right knee, initial encounter: Secondary | ICD-10-CM | POA: Diagnosis not present

## 2022-01-22 DIAGNOSIS — Z23 Encounter for immunization: Secondary | ICD-10-CM | POA: Diagnosis not present

## 2022-01-22 DIAGNOSIS — Y998 Other external cause status: Secondary | ICD-10-CM | POA: Diagnosis not present

## 2022-01-22 DIAGNOSIS — W293XXA Contact with powered garden and outdoor hand tools and machinery, initial encounter: Secondary | ICD-10-CM | POA: Diagnosis not present

## 2022-02-01 ENCOUNTER — Ambulatory Visit (INDEPENDENT_AMBULATORY_CARE_PROVIDER_SITE_OTHER): Payer: Medicare HMO | Admitting: Family

## 2022-02-01 ENCOUNTER — Encounter: Payer: Self-pay | Admitting: Family

## 2022-02-01 VITALS — BP 126/70 | HR 60 | Temp 98.7°F | Ht 67.0 in | Wt 131.4 lb

## 2022-02-01 DIAGNOSIS — Z4802 Encounter for removal of sutures: Secondary | ICD-10-CM | POA: Diagnosis not present

## 2022-02-01 DIAGNOSIS — S81011D Laceration without foreign body, right knee, subsequent encounter: Secondary | ICD-10-CM | POA: Diagnosis not present

## 2022-02-01 NOTE — Progress Notes (Signed)
Provider: Marlowe Sax FNP-C  Tanveer Dobberstein, Nelda Bucks, NP  Patient Care Team: Karagan Lehr, Nelda Bucks, NP as PCP - General (Family Medicine) Warden Fillers, MD as Consulting Physician (Ophthalmology)  Extended Emergency Contact Information Primary Emergency Contact: [facility] Address: [place], [place] [address] [place] Phone: [phone] Relation: Spouse  Code Status: Full Code  Goals of care: Advanced Directive information    12/31/2021    9:03 AM  Advanced Directives  Does Patient Have a Medical Advance Directive? Yes  Type of Advance Directive Living will  Does patient want to make changes to medical advance directive? No - Patient declined     Chief Complaint  Patient presents with   Acute Visit    Patient needs stitches removed.     HPI:  Pt is a 81 y.o. male seen today for an acute visit for stitches removal on right knee.States he accidentally cut knee with a chain saw 01/22/2022 .He was seen at [facility].he sustained a laceration which repaired with 5 stitches. He denies any fever,chills,redness or drainage.He was discharged on Keflex 500 mg capsule three times daily for 5 days.He has completed antibiotics as directed. He was also given Tdap vaccine.   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   Chest pain, unspecified 08/20/2012   Chest pain, unspecified 12/26/2011   Concussion, unspecified 10/30/1994   COPD (chronic obstructive pulmonary disease) ([place]) 02/24/2006   Corns and callosities 04/11/2007   Dermatophytosis of the body    Disturbance of skin sensation    Diverticulosis of colon (without mention of hemorrhage) 06/14/2010   Edema of male genital organs 08/20/2012   Elevated blood pressure reading without diagnosis of hypertension 06/14/2010   Encounter for long-term (current) use of other  medications    Hypercholesterolemia    Hypertrophy of prostate without urinary obstruction and other lower urinary tract symptoms (LUTS)    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   Lumbago 05/11/2009   Nocturia 08/20/2012   Other and unspecified hyperlipidemia 02/24/2006   Pain in joint, ankle and foot 04/11/2007   Pterygium, unspecified 03/01/2006   Rash and other nonspecific skin eruption 12/28/1999   Seborrheic dermatitis, unspecified 03/21/2001   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   Tobacco use disorder    Ulcer of other part of foot 03/16/2007   Unspecified constipation 05/11/2009   Urinary tract infection, site not specified 05/11/2009   Ventral hernia, unspecified, without mention of obstruction or gangrene    Past Surgical History:  Procedure Laterality Date   ELBOW SURGERY Right    EYE SURGERY     Cataract Removal   FRACTURE SURGERY  1986   right hip, right elbow   KNEE SURGERY Right 01/2007   knee cap injury   SKIN CANCER EXCISION  07/2014   Back and head    TONSILLECTOMY  1948    Allergies  Allergen Reactions   Zocor [Simvastatin] Shortness Of Breath    Sob and itching   Ace Inhibitors Cough    Tolerating ARB instead   Lipitor [Atorvastatin]     Muscle and bone pain   Pravachol [Pravastatin Sodium] Other (See Comments)    myalgias    Outpatient Encounter Medications as of 02/01/2022  Medication Sig   albuterol (VENTOLIN HFA) 108 (90 Base) MCG/ACT  inhaler Inhale 2 puffs into the lungs every 6 (six) hours as needed for wheezing or shortness of breath.   cephALEXin (KEFLEX) 500 MG capsule Take 500 mg by mouth 3 (three) times daily.   omeprazole (PRILOSEC) 20 MG capsule TAKE 1 CAPSULE EVERY DAY   REPATHA SURECLICK 093 MG/ML SOAJ INJECT 1 DOSE SUBCUTANEOUSLY EVERY 14 DAYS   tamsulosin (FLOMAX) 0.4 MG CAPS capsule TAKE 1 CAPSULE EVERY DAY   valsartan (DIOVAN) 80 MG tablet Take 1 tablet (80 mg total) by mouth daily.    [DISCONTINUED] ezetimibe (ZETIA) 10 MG tablet TAKE 1 TABLET (10 MG TOTAL) BY MOUTH DAILY.   [DISCONTINUED] nitroGLYCERIN (NITROSTAT) 0.4 MG SL tablet Place 1 tablet (0.4 mg total) under the tongue every 5 (five) minutes as needed for chest pain (max 3 doses).   No facility-administered encounter medications on file as of 02/01/2022.    Review of Systems  Constitutional:  Negative for appetite change, chills, fatigue and fever.  Musculoskeletal:  Negative for gait problem, joint swelling and myalgias.  Skin:  Negative for color change, pallor and rash.       Right knee laceration with stitches in place   Neurological:  Negative for dizziness, weakness, numbness and headaches.    Immunization History  Administered Date(s) Administered   Fluad Quad(high Dose 65+) 05/27/2019, 03/05/2020, 07/02/2021   Influenza, High Dose Seasonal PF 03/09/2017, 05/21/2018   Influenza,inj,Quad PF,6+ Mos 03/31/2014, 04/03/2015, 02/25/2016   PFIZER(Purple Top)SARS-COV-2 Vaccination 07/21/2019, 11/06/2019   Pneumococcal Conjugate-13 07/18/2013   Pneumococcal Polysaccharide-23 10/02/2014, 12/31/2021   Tdap 06/10/2005, 12/03/2013   Pertinent  Health Maintenance Due  Topic Date Due   INFLUENZA VACCINE  01/04/2022      11/18/2020   11:25 AM 12/25/2020    9:22 AM 07/02/2021    9:26 AM 11/10/2021   11:13 AM 12/31/2021    9:03 AM  Fall Risk  Falls in the past year? 0 0 0 0 0  Was there an injury with Fall? 0 0 0 0 0  Fall Risk Category Calculator 0 0 0 0 0  Fall Risk Category Low Low Low Low Low  Patient Fall Risk Level Low fall risk Low fall risk Low fall risk Low fall risk Low fall risk  Patient at Risk for Falls Due to  No Fall Risks No Fall Risks No Fall Risks No Fall Risks  Fall risk Follow up  Falls evaluation completed Falls evaluation completed Falls evaluation completed Falls evaluation completed   Functional Status Survey:    Vitals:   02/01/22 0854  BP: 126/70  Pulse: 60  Temp: 98.7 F (37.1  C)  TempSrc: Oral  SpO2: 97%  Weight: 131 lb 6.4 oz (59.6 kg)  Height: '5\' 7"'$  (1.702 m)   Body mass index is 20.58 kg/m. Physical Exam Vitals reviewed.  Constitutional:      General: He is not in acute distress.    Appearance: Normal appearance. He is normal weight. He is not ill-appearing or diaphoretic.  HENT:     Head: Normocephalic.  Cardiovascular:     Rate and Rhythm: Normal rate and regular rhythm.     Pulses: Normal pulses.     Heart sounds: Normal heart sounds. No murmur heard.    No friction rub. No gallop.  Pulmonary:     Effort: Pulmonary effort is normal. No respiratory distress.     Breath sounds: Normal breath sounds. No wheezing, rhonchi or rales.  Chest:     Chest wall: No tenderness.  Musculoskeletal:  General: No swelling or tenderness. Normal range of motion.     Right lower leg: No edema.     Left lower leg: No edema.  Skin:    General: Skin is warm and dry.     Coloration: Skin is not pale.     Findings: No bruising, erythema, lesion or rash.     Comments: Right knee laceration site with stitches x 5 intact.Knee area cleansed with saline,pat dry then x 5 Stitches removed with stitch remover without any difficult.patient tolerated procedure well.No erythema,swelling,odor or drainage noted.    Neurological:     Mental Status: He is alert and oriented to person, place, and time.     Sensory: No sensory deficit.     Motor: No weakness.     Gait: Gait normal.  Psychiatric:        Mood and Affect: Mood normal.        Speech: Speech normal.        Behavior: Behavior normal.     Labs reviewed: Recent Labs    06/29/21 0837 12/31/21 0951  NA 138 135  K 4.3 5.2  CL 104 100  CO2 30 27  GLUCOSE 94 88  BUN 13 14  CREATININE 1.34* 1.58*  CALCIUM 8.9 9.5   Recent Labs    06/29/21 0837 12/31/21 0951  AST 15 18  ALT 7* 10  BILITOT 0.6 0.7  PROT 6.5 7.0   Recent Labs    06/29/21 0837 12/31/21 0951  WBC 4.9 5.3  NEUTROABS 2,999 3,514   HGB 14.4 14.9  HCT 43.8 43.3  MCV 89.6 88.0  PLT 240 221   Lab Results  Component Value Date   TSH 2.69 12/31/2021   Lab Results  Component Value Date   HGBA1C 5.9 (H) 12/31/2021   Lab Results  Component Value Date   CHOL 157 12/31/2021   HDL 65 12/31/2021   LDLCALC 75 12/31/2021   TRIG 86 12/31/2021   CHOLHDL 2.4 12/31/2021    Significant Diagnostic Results in last 30 days:  No results found.  Assessment/Plan 1. Encounter for removal of sutures Afebrile. Right knee laceration site with stitches x 5 intact.Knee area cleansed with saline,pat dry then x 5 Stitches removed with stitch remover without any difficult.patient tolerated procedure well.No erythema,swelling,odor or drainage noted.    2. Laceration of right knee, subsequent encounter Healing well. Advised to continue to keep laceration area clean and notify provider for any signs of redness,swelling,drainage or odor.   Family/ staff Communication: Reviewed plan of care with patient verbalized understanding   Labs/tests ordered: None   Next Appointment: As needed if symptoms worsen or fail to improve    Sandrea Hughs, NP

## 2022-02-01 NOTE — Patient Instructions (Signed)
cleanse with saline ,pat dry, triple antibiotic ointment applied and leave open to air.Notify provider for any redness,swelling or drainage.

## 2022-03-16 ENCOUNTER — Other Ambulatory Visit: Payer: Self-pay | Admitting: Family

## 2022-05-31 ENCOUNTER — Other Ambulatory Visit: Payer: Self-pay | Admitting: Family

## 2022-05-31 DIAGNOSIS — I10 Essential (primary) hypertension: Secondary | ICD-10-CM

## 2022-07-01 ENCOUNTER — Ambulatory Visit: Payer: Medicare HMO | Admitting: Internal Medicine

## 2022-07-04 ENCOUNTER — Encounter: Payer: Self-pay | Admitting: Family

## 2022-07-04 ENCOUNTER — Ambulatory Visit (INDEPENDENT_AMBULATORY_CARE_PROVIDER_SITE_OTHER): Payer: Medicare HMO | Admitting: Family

## 2022-07-04 VITALS — BP 120/80 | HR 60 | Temp 96.8°F | Resp 16 | Ht 67.0 in | Wt 128.0 lb

## 2022-07-04 DIAGNOSIS — I10 Essential (primary) hypertension: Secondary | ICD-10-CM

## 2022-07-04 DIAGNOSIS — E7800 Pure hypercholesterolemia, unspecified: Secondary | ICD-10-CM | POA: Diagnosis not present

## 2022-07-04 DIAGNOSIS — K219 Gastro-esophageal reflux disease without esophagitis: Secondary | ICD-10-CM

## 2022-07-04 DIAGNOSIS — J449 Chronic obstructive pulmonary disease, unspecified: Secondary | ICD-10-CM

## 2022-07-04 DIAGNOSIS — R7303 Prediabetes: Secondary | ICD-10-CM | POA: Diagnosis not present

## 2022-07-04 DIAGNOSIS — Z23 Encounter for immunization: Secondary | ICD-10-CM

## 2022-07-04 DIAGNOSIS — N1831 Chronic kidney disease, stage 3a: Secondary | ICD-10-CM

## 2022-07-04 NOTE — Progress Notes (Signed)
Provider: Marlowe Sax FNP-C   Nichalos Brenton, Nelda Bucks, NP  Patient Care Team: Claire Bridge, Nelda Bucks, NP as PCP - General (Family Medicine) Warden Fillers, MD as Consulting Physician (Ophthalmology)  Extended Emergency Contact Information Primary Emergency Contact: [facility] Address: [place], [place] [address] [place] Phone: [phone] Relation: Spouse  Code Status:  Full Code  Goals of care: Advanced Directive information    07/04/2022    8:57 AM  Advanced Directives  Does Patient Have a Medical Advance Directive? Yes  Type of Advance Directive Living will  Does patient want to make changes to medical advance directive? No - Patient declined     Chief Complaint  Patient presents with   Medical Management of Chronic Issues    6 month follow up.    Immunizations    Discuss the need for Shingrix vaccine.    HPI:  Pt is a 82 y.o. male seen today for 6 months follow for medical management of chronic diseases.    COPD - states sometimes coughs up some mucus but not all the time.She denies any fever,chills,cough,fatigue,body aches,runny nose,chest tightness,chest pain,palpitation or shortness of breath.  Hypertension - does not check B.p at home.denies any headache,dizziness,vision changes,fatigue,chest tightness,palpitation,chest pain or shortness of breath.     Hyperlipidemia - last LDL was 75   Prediabetes - last A1C was 5.9   GERD - has not required any Tums.   States eats one meal per day.just don't feel like eating the older he gets.He denies any feelings of depression.No issues affording his food.   Had all teeth removed but could not afford the dentures waiting for insurance.   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   Chest pain, unspecified 08/20/2012   Chest pain, unspecified 12/26/2011   Concussion, unspecified  10/30/1994   COPD (chronic obstructive pulmonary disease) ([place]) 02/24/2006   Corns and callosities 04/11/2007   Dermatophytosis of the body    Disturbance of skin sensation    Diverticulosis of colon (without mention of hemorrhage) 06/14/2010   Edema of male genital organs 08/20/2012   Elevated blood pressure reading without diagnosis of hypertension 06/14/2010   Encounter for long-term (current) use of other medications    Hypercholesterolemia    Hypertrophy of prostate without urinary obstruction and other lower urinary tract symptoms (LUTS)    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   Lumbago 05/11/2009   Nocturia 08/20/2012   Other and unspecified hyperlipidemia 02/24/2006   Pain in joint, ankle and foot 04/11/2007   Pterygium, unspecified 03/01/2006   Rash and other nonspecific skin eruption 12/28/1999   Seborrheic dermatitis, unspecified 03/21/2001   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   Tobacco use disorder    Ulcer of other part of foot 03/16/2007   Unspecified constipation 05/11/2009   Urinary tract infection, site not specified 05/11/2009   Ventral hernia, unspecified, without mention of obstruction or gangrene    Past Surgical History:  Procedure Laterality Date   ELBOW SURGERY Right    EYE SURGERY     Cataract Removal   FRACTURE SURGERY  1986   right hip, right elbow   KNEE SURGERY Right 01/2007   knee cap injury   SKIN CANCER EXCISION  07/2014   Back and head    TONSILLECTOMY  1948  Allergies  Allergen Reactions   Zocor [Simvastatin] Shortness Of Breath    Sob and itching   Ace Inhibitors Cough    Tolerating ARB instead   Lipitor [Atorvastatin]     Muscle and bone pain   Pravachol [Pravastatin Sodium] Other (See Comments)    myalgias    Allergies as of 07/04/2022       Reactions   Zocor [simvastatin] Shortness Of Breath   Sob and itching   Ace Inhibitors Cough   Tolerating ARB instead   Lipitor  [atorvastatin]    Muscle and bone pain   Pravachol [pravastatin Sodium] Other (See Comments)   myalgias        Medication List        Accurate as of July 04, 2022  9:43 AM. If you have any questions, ask your nurse or doctor.          albuterol 108 (90 Base) MCG/ACT inhaler Commonly known as: VENTOLIN HFA Inhale 2 puffs into the lungs every 6 (six) hours as needed for wheezing or shortness of breath.   cephALEXin 500 MG capsule Commonly known as: KEFLEX Take 500 mg by mouth 3 (three) times daily.   omeprazole 20 MG capsule Commonly known as: PRILOSEC Take 1 capsule (20 mg total) by mouth daily.   Repatha SureClick 782 MG/ML [place] Generic drug: Evolocumab INJECT 1 DOSE SUBCUTANEOUSLY EVERY 14 DAYS   tamsulosin 0.4 MG Caps capsule Commonly known as: FLOMAX TAKE 1 CAPSULE EVERY DAY   valsartan 80 MG tablet Commonly known as: DIOVAN TAKE 1 TABLET EVERY DAY        Review of Systems  Constitutional:  Negative for appetite change, chills, fatigue, fever and unexpected weight change.  HENT:  Positive for dental problem and hearing loss. Negative for congestion, ear discharge, ear pain, facial swelling, nosebleeds, postnasal drip, rhinorrhea, sinus pressure, sinus pain, sneezing, sore throat, tinnitus and trouble swallowing.        Did not wear hearing aids to visit   Eyes:  Negative for pain, discharge, redness, itching and visual disturbance.  Respiratory:  Negative for cough, chest tightness, shortness of breath and wheezing.   Cardiovascular:  Negative for chest pain, palpitations and leg swelling.  Gastrointestinal:  Negative for abdominal distention, abdominal pain, blood in stool, constipation, diarrhea, nausea and vomiting.  Endocrine: Negative for cold intolerance, heat intolerance, polydipsia, polyphagia and polyuria.  Genitourinary:  Negative for difficulty urinating, dysuria, flank pain, frequency and urgency.  Musculoskeletal:  Negative for arthralgias,  back pain, gait problem, joint swelling, myalgias, neck pain and neck stiffness.  Skin:  Negative for color change, pallor, rash and wound.  Neurological:  Negative for dizziness, syncope, speech difficulty, weakness, light-headedness, numbness and headaches.  Hematological:  Does not bruise/bleed easily.  Psychiatric/Behavioral:  Negative for agitation, behavioral problems, confusion, hallucinations, self-injury, sleep disturbance and suicidal ideas. The patient is not nervous/anxious.     Immunization History  Administered Date(s) Administered   Fluad Quad(high Dose 65+) 05/27/2019, 03/05/2020, 07/02/2021, 07/04/2022   Influenza, High Dose Seasonal PF 03/09/2017, 05/21/2018   Influenza,inj,Quad PF,6+ Mos 03/31/2014, 04/03/2015, 02/25/2016   PFIZER(Purple Top)SARS-COV-2 Vaccination 07/21/2019, 11/06/2019   Pneumococcal Conjugate-13 07/18/2013   Pneumococcal Polysaccharide-23 10/02/2014, 12/31/2021   Tdap 06/10/2005, 12/03/2013, 01/22/2022   Pertinent  Health Maintenance Due  Topic Date Due   INFLUENZA VACCINE  Completed      12/25/2020    9:22 AM 07/02/2021    9:26 AM 11/10/2021   11:13 AM 12/31/2021    9:03 AM  07/04/2022    8:56 AM  Fall Risk  Falls in the past year? 0 0 0 0 0  Was there an injury with Fall? 0 0 0 0 0  Fall Risk Category Calculator 0 0 0 0 0  Fall Risk Category (Retired) Low Low Low Low   (RETIRED) Patient Fall Risk Level Low fall risk Low fall risk Low fall risk Low fall risk   Patient at Risk for Falls Due to No Fall Risks No Fall Risks No Fall Risks No Fall Risks No Fall Risks  Fall risk Follow up Falls evaluation completed Falls evaluation completed Falls evaluation completed Falls evaluation completed Falls evaluation completed   Functional Status Survey:    Vitals:   07/04/22 0856  BP: 120/80  Pulse: 60  Resp: 16  Temp: (!) 96.8 F (36 C)  SpO2: 97%  Weight: 128 lb (58.1 kg)  Height: '5\' 7"'$  (1.702 m)   Body mass index is 20.05 kg/m. Physical  Exam Vitals reviewed.  Constitutional:      General: He is not in acute distress.    Appearance: Normal appearance. He is normal weight. He is not ill-appearing or diaphoretic.  HENT:     Head: Normocephalic.     Right Ear: Tympanic membrane, ear canal and external ear normal. There is no impacted cerumen.     Left Ear: Tympanic membrane, ear canal and external ear normal. There is no impacted cerumen.     Nose: Nose normal. No congestion or rhinorrhea.     Mouth/Throat:     Mouth: Mucous membranes are moist.     Pharynx: Oropharynx is clear. No oropharyngeal exudate or posterior oropharyngeal erythema.  Eyes:     General: No scleral icterus.       Right eye: No discharge.        Left eye: No discharge.     Extraocular Movements: Extraocular movements intact.     Conjunctiva/sclera: Conjunctivae normal.     Pupils: Pupils are equal, round, and reactive to light.  Neck:     Vascular: No carotid bruit.  Cardiovascular:     Rate and Rhythm: Normal rate and regular rhythm.     Pulses: Normal pulses.     Heart sounds: Normal heart sounds. No murmur heard.    No friction rub. No gallop.  Pulmonary:     Effort: Pulmonary effort is normal. No respiratory distress.     Breath sounds: Normal breath sounds. No wheezing, rhonchi or rales.  Chest:     Chest wall: No tenderness.  Abdominal:     General: Bowel sounds are normal. There is no distension.     Palpations: Abdomen is soft. There is no mass.     Tenderness: There is no abdominal tenderness. There is no right CVA tenderness, left CVA tenderness, guarding or rebound.  Musculoskeletal:        General: No swelling or tenderness. Normal range of motion.     Cervical back: Normal range of motion. No rigidity or tenderness.     Right lower leg: No edema.     Left lower leg: No edema.  Lymphadenopathy:     Cervical: No cervical adenopathy.  Skin:    General: Skin is warm and dry.     Coloration: Skin is not pale.     Findings: No  bruising, erythema, lesion or rash.  Neurological:     Mental Status: He is alert and oriented to person, place, and time.     Cranial  Nerves: No cranial nerve deficit.     Sensory: No sensory deficit.     Motor: No weakness.     Coordination: Coordination normal.     Gait: Gait normal.  Psychiatric:        Mood and Affect: Mood normal.        Speech: Speech normal.        Behavior: Behavior normal.        Thought Content: Thought content normal.        Judgment: Judgment normal.    Labs reviewed: Recent Labs    12/31/21 0951  NA 135  K 5.2  CL 100  CO2 27  GLUCOSE 88  BUN 14  CREATININE 1.58*  CALCIUM 9.5   Recent Labs    12/31/21 0951  AST 18  ALT 10  BILITOT 0.7  PROT 7.0   Recent Labs    12/31/21 0951  WBC 5.3  NEUTROABS 3,514  HGB 14.9  HCT 43.3  MCV 88.0  PLT 221   Lab Results  Component Value Date   TSH 2.69 12/31/2021   Lab Results  Component Value Date   HGBA1C 5.9 (H) 12/31/2021   Lab Results  Component Value Date   CHOL 157 12/31/2021   HDL 65 12/31/2021   LDLCALC 75 12/31/2021   TRIG 86 12/31/2021   CHOLHDL 2.4 12/31/2021    Significant Diagnostic Results in last 30 days:  No results found.  Assessment/Plan 1. Need for influenza vaccination Afebrile  Flut shot administered by CMA no acute reaction reported.   - Flu Vaccine QUAD High Dose(Fluad)  2. Essential hypertension B/p stable  - continue on valsartan  - TSH; Future - COMPLETE METABOLIC PANEL WITH GFR; Future - CBC with Differential/Platelet; Future  3. Hypercholesterolemia LDL at goal  - dietary modification and exercise advised  - continue on Repatha  - Lipid panel; Future  4. Prediabetes Lab Results  Component Value Date   HGBA1C 5.9 (H) 12/31/2021  Dietary modification and exercise as tolerated  - Hemoglobin A1c; Future  5. COPD mixed type ([place]) Breathing stable  - continue on Albuterol  - COMPLETE METABOLIC PANEL WITH GFR; Future  6.  Gastroesophageal reflux disease without esophagitis H/H stable  - CBC with Differential/Platelet; Future  7. Stage 3a chronic kidney disease ([facility]) Latest CR on chart 1.58 Will continue to avoid Nephrotoxins and dose all other medication for renal clearance  - COMPLETE METABOLIC PANEL WITH GFR; Future  Family/ staff Communication: Reviewed plan of care with patient verbalized understanding   Labs/tests ordered:  - CBC with Differential/Platelet - CMP with eGFR([facility]) - TSH - Hgb A1C - Lipid panel  Next Appointment : Return in about 6 months (around 01/02/2023) for fasting labs in one week.   Sandrea Hughs, NP

## 2022-07-14 DIAGNOSIS — Z01818 Encounter for other preprocedural examination: Secondary | ICD-10-CM | POA: Diagnosis not present

## 2022-07-14 DIAGNOSIS — I4891 Unspecified atrial fibrillation: Secondary | ICD-10-CM | POA: Diagnosis not present

## 2022-07-18 ENCOUNTER — Other Ambulatory Visit: Payer: Medicare HMO

## 2022-07-18 DIAGNOSIS — N1831 Chronic kidney disease, stage 3a: Secondary | ICD-10-CM

## 2022-07-18 DIAGNOSIS — I10 Essential (primary) hypertension: Secondary | ICD-10-CM | POA: Diagnosis not present

## 2022-07-18 DIAGNOSIS — E7800 Pure hypercholesterolemia, unspecified: Secondary | ICD-10-CM

## 2022-07-18 DIAGNOSIS — R7303 Prediabetes: Secondary | ICD-10-CM | POA: Diagnosis not present

## 2022-07-18 DIAGNOSIS — J449 Chronic obstructive pulmonary disease, unspecified: Secondary | ICD-10-CM | POA: Diagnosis not present

## 2022-07-18 DIAGNOSIS — K219 Gastro-esophageal reflux disease without esophagitis: Secondary | ICD-10-CM | POA: Diagnosis not present

## 2022-07-19 LAB — CBC WITH DIFFERENTIAL/PLATELET
Absolute Monocytes: 442 cells/uL (ref 200–950)
Basophils Absolute: 34 cells/uL (ref 0–200)
Basophils Relative: 0.4 %
Eosinophils Absolute: 179 cells/uL (ref 15–500)
Eosinophils Relative: 2.1 %
HCT: 41.9 % (ref 38.5–50.0)
Hemoglobin: 14.3 g/dL (ref 13.2–17.1)
Lymphs Abs: 765 cells/uL — ABNORMAL LOW (ref 850–3900)
MCH: 30.6 pg (ref 27.0–33.0)
MCHC: 34.1 g/dL (ref 32.0–36.0)
MCV: 89.7 fL (ref 80.0–100.0)
MPV: 10 fL (ref 7.5–12.5)
Monocytes Relative: 5.2 %
Neutro Abs: 7081 cells/uL (ref 1500–7800)
Neutrophils Relative %: 83.3 %
Platelets: 203 10*3/uL (ref 140–400)
RBC: 4.67 10*6/uL (ref 4.20–5.80)
RDW: 13 % (ref 11.0–15.0)
Total Lymphocyte: 9 %
WBC: 8.5 10*3/uL (ref 3.8–10.8)

## 2022-07-19 LAB — COMPLETE METABOLIC PANEL WITH GFR
AG Ratio: 1.4 (calc) (ref 1.0–2.5)
ALT: 6 U/L — ABNORMAL LOW (ref 9–46)
AST: 18 U/L (ref 10–35)
Albumin: 3.8 g/dL (ref 3.6–5.1)
Alkaline phosphatase (APISO): 58 U/L (ref 35–144)
BUN/Creatinine Ratio: 10 (calc) (ref 6–22)
BUN: 14 mg/dL (ref 7–25)
CO2: 28 mmol/L (ref 20–32)
Calcium: 8.8 mg/dL (ref 8.6–10.3)
Chloride: 101 mmol/L (ref 98–110)
Creat: 1.44 mg/dL — ABNORMAL HIGH (ref 0.70–1.22)
Globulin: 2.7 g/dL (calc) (ref 1.9–3.7)
Glucose, Bld: 86 mg/dL (ref 65–99)
Potassium: 4.5 mmol/L (ref 3.5–5.3)
Sodium: 136 mmol/L (ref 135–146)
Total Bilirubin: 0.7 mg/dL (ref 0.2–1.2)
Total Protein: 6.5 g/dL (ref 6.1–8.1)
eGFR: 49 mL/min/{1.73_m2} — ABNORMAL LOW (ref >=60)

## 2022-07-19 LAB — LIPID PANEL
Cholesterol: 142 mg/dL (ref <200)
HDL: 61 mg/dL (ref >=40)
LDL Cholesterol (Calc): 65 mg/dL (calc)
Non-HDL Cholesterol (Calc): 81 mg/dL (calc) (ref <130)
Total CHOL/HDL Ratio: 2.3 (calc) (ref <5.0)
Triglycerides: 79 mg/dL (ref <150)

## 2022-07-19 LAB — HEMOGLOBIN A1C
Hgb A1c MFr Bld: 6 % of total Hgb — ABNORMAL HIGH (ref <5.7)
Mean Plasma Glucose: 126 mg/dL
eAG (mmol/L): 7 mmol/L

## 2022-07-19 LAB — TSH: TSH: 2.9 mIU/L (ref 0.40–4.50)

## 2022-07-20 ENCOUNTER — Ambulatory Visit
Admission: EM | Admit: 2022-07-20 | Discharge: 2022-07-20 | Disposition: A | Discharge status: Home / Self Care | Payer: Medicare HMO | Attending: Internal Medicine | Admitting: Internal Medicine

## 2022-07-20 ENCOUNTER — Ambulatory Visit: Payer: Medicare HMO | Admitting: Orthopedic Surgery

## 2022-07-20 ENCOUNTER — Ambulatory Visit (INDEPENDENT_AMBULATORY_CARE_PROVIDER_SITE_OTHER): Payer: Medicare HMO

## 2022-07-20 DIAGNOSIS — J069 Acute upper respiratory infection, unspecified: Secondary | ICD-10-CM

## 2022-07-20 DIAGNOSIS — U071 COVID-19: Secondary | ICD-10-CM | POA: Diagnosis not present

## 2022-07-20 DIAGNOSIS — J439 Emphysema, unspecified: Secondary | ICD-10-CM | POA: Diagnosis not present

## 2022-07-20 DIAGNOSIS — R059 Cough, unspecified: Secondary | ICD-10-CM | POA: Diagnosis not present

## 2022-07-20 MED ORDER — BENZONATATE 100 MG PO CAPS: 100.0000 mg | ORAL_CAPSULE | Freq: Three times a day (TID) | ORAL | 0 refills | Status: DC | PRN

## 2022-07-20 MED ORDER — ALBUTEROL SULFATE (2.5 MG/3ML) 0.083% IN NEBU
2.5000 mg | INHALATION_SOLUTION | Freq: Once | RESPIRATORY_TRACT | Status: AC
  Administered 2022-07-20: 2.5 mg via RESPIRATORY_TRACT

## 2022-07-20 MED ORDER — ALBUTEROL SULFATE HFA 108 (90 BASE) MCG/ACT IN AERS: 2.0000 | INHALATION_SPRAY | Freq: Four times a day (QID) | RESPIRATORY_TRACT | 0 refills | Status: DC | PRN

## 2022-07-20 MED ORDER — PREDNISONE 20 MG PO TABS: 40.0000 mg | ORAL_TABLET | Freq: Every day | ORAL | 0 refills | Status: AC

## 2022-07-20 NOTE — ED Triage Notes (Signed)
Pt c/o cough, fever, congestion, chills, and sore throat since Friday. States having rib pain from coughing hard. States having SOB when he has coughing spells. States taking OTC meds with little relief.

## 2022-07-20 NOTE — ED Provider Notes (Signed)
[facility]    CSN: [id number] Arrival date & time: 07/20/22  0912      History   Chief Complaint Chief Complaint  Patient presents with   Cough    HPI Thomas Reyes is a 82 y.o. male.   Patient presents with cough, fever, nasal congestion, chills, sore throat, headache that started about 6 days ago.  Patient reports that he is having bilateral rib pain that occurs when he is coughing.  He reports productive cough.  Also reports shortness of breath episodes with coughing episodes.  Denies current chest pain or shortness of breath.  Has been taking over-the-counter cold and flu medication and Tylenol with minimal improvement of symptoms.  Reports history of COPD.  Patient does not currently smoke cigarettes.  Wife at bedside who helps provide history.   Cough   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   Chest pain, unspecified 08/20/2012   Chest pain, unspecified 12/26/2011   Concussion, unspecified 10/30/1994   COPD (chronic obstructive pulmonary disease) ([place]) 02/24/2006   Corns and callosities 04/11/2007   Dermatophytosis of the body    Disturbance of skin sensation    Diverticulosis of colon (without mention of hemorrhage) 06/14/2010   Edema of male genital organs 08/20/2012   Elevated blood pressure reading without diagnosis of hypertension 06/14/2010   Encounter for long-term (current) use of other medications    Hypercholesterolemia    Hypertrophy of prostate without urinary obstruction and other lower urinary tract symptoms (LUTS)    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   Lumbago 05/11/2009   Nocturia 08/20/2012   Other and unspecified hyperlipidemia 02/24/2006   Pain in joint, ankle and foot 04/11/2007   Pterygium, unspecified 03/01/2006   Rash and other nonspecific skin eruption 12/28/1999   Seborrheic dermatitis, unspecified 03/21/2001   Squamous cell  carcinoma of skin of other and unspecified parts of face 12/26/2011   Tobacco use disorder    Ulcer of other part of foot 03/16/2007   Unspecified constipation 05/11/2009   Urinary tract infection, site not specified 05/11/2009   Ventral hernia, unspecified, without mention of obstruction or gangrene     Patient Active Problem List   Diagnosis Date Noted   Macular degeneration 05/27/2019   Hyperglycemia 05/27/2019   Family history of Alzheimer's disease 02/25/2016   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   COPD GOLD II  07/08/2015   Benign prostatic hypertrophy (BPH) with weak urinary stream 04/05/2015   Abnormal x-ray of lungs with single pulmonary nodule 04/05/2015   Atopic eczema 12/26/2014   Hearing loss 12/26/2014   History of non-penetrating eye injury 12/26/2014   Essential hypertension, benign 07/18/2013   Rash of groin 07/18/2013   Constipation, slow transit 10/04/2012   Personal history of colonic polyps 10/04/2012   Hypercholesterolemia    BPH with obstruction/lower urinary tract symptoms     Past Surgical History:  Procedure Laterality Date   ELBOW SURGERY Right    EYE SURGERY     Cataract Removal   FRACTURE SURGERY  1986   right hip, right elbow   KNEE SURGERY Right 01/2007   knee cap injury   SKIN CANCER EXCISION  07/2014   Back and head    TONSILLECTOMY  1948       Home Medications    Prior to Admission medications   Medication Sig Start Date End Date Taking? Authorizing Provider  benzonatate (TESSALON) 100 MG capsule  Take 1 capsule (100 mg total) by mouth every 8 (eight) hours as needed for cough. 07/20/22  Yes Gita Dilger, Hildred Alamin E, FNP  predniSONE (DELTASONE) 20 MG tablet Take 2 tablets (40 mg total) by mouth daily for 5 days. 07/20/22 07/25/22 Yes Javana Schey, Michele Rockers, FNP  albuterol (VENTOLIN HFA) 108 (90 Base) MCG/ACT inhaler Inhale 2 puffs into the lungs every 6 (six) hours as needed for wheezing or shortness of breath. 07/20/22   Teodora Medici, FNP  omeprazole  (PRILOSEC) 20 MG capsule Take 1 capsule (20 mg total) by mouth daily. 03/16/22   Ngetich, Nelda Bucks, NP  REPATHA SURECLICK [id number] MG/ML SOAJ INJECT 1 DOSE SUBCUTANEOUSLY EVERY 14 DAYS 09/06/21   Hilty, Nadean Corwin, MD  tamsulosin (FLOMAX) 0.4 MG CAPS capsule TAKE 1 CAPSULE EVERY DAY 12/01/21   Ngetich, [name] C, NP  valsartan (DIOVAN) 80 MG tablet TAKE 1 TABLET EVERY DAY 05/31/22   Ngetich, Nelda Bucks, NP    Family History Family History  Problem Relation Age of Onset   Diabetes Mother    Hypertension Mother    Cancer Father     Social History Social History   Tobacco Use   Smoking status: Former    Types: Cigarettes    Quit date: 06/09/2010    Years since quitting: 12.1   Smokeless tobacco: Never  Vaping Use   Vaping Use: Never used  Substance Use Topics   Alcohol use: No    Alcohol/week: 0.0 standard drinks of alcohol   Drug use: No     Allergies   Zocor [simvastatin], Ace inhibitors, Lipitor [atorvastatin], and Pravachol [pravastatin sodium]   Review of Systems Review of Systems Per HPI  Physical Exam Triage Vital Signs ED Triage Vitals  Enc Vitals Group     BP 07/20/22 1013 (!) 159/82     Pulse Rate 07/20/22 1013 94     Resp 07/20/22 1013 18     Temp 07/20/22 1013 98.6 F (37 C)     Temp Source 07/20/22 1013 Oral     SpO2 07/20/22 1013 94 %     Weight --      Height --      Head Circumference --      Peak Flow --      Pain Score 07/20/22 1014 0     Pain Loc --      Pain Edu? --      Excl. in [place]? --    No data found.  Updated Vital Signs BP (!) 159/82 (BP Location: Left Arm)   Pulse 94   Temp 98.6 F (37 C) (Oral)   Resp 18   SpO2 96%   Visual Acuity Right Eye Distance:   Left Eye Distance:   Bilateral Distance:    Right Eye Near:   Left Eye Near:    Bilateral Near:     Physical Exam Constitutional:      General: He is not in acute distress.    Appearance: Normal appearance. He is not toxic-appearing or diaphoretic.  HENT:     Head: Normocephalic  and atraumatic.     Right Ear: Tympanic membrane and ear canal normal.     Left Ear: Tympanic membrane and ear canal normal.     Nose: Congestion present.     Mouth/Throat:     Mouth: Mucous membranes are moist.     Pharynx: No posterior oropharyngeal erythema.  Eyes:     Extraocular Movements: Extraocular movements intact.     Conjunctiva/sclera:  Conjunctivae normal.     Pupils: Pupils are equal, round, and reactive to light.  Cardiovascular:     Rate and Rhythm: Normal rate and regular rhythm.     Pulses: Normal pulses.     Heart sounds: Normal heart sounds.  Pulmonary:     Effort: Pulmonary effort is normal. No respiratory distress.     Breath sounds: Normal breath sounds. No stridor. No wheezing, rhonchi or rales.  Abdominal:     General: Abdomen is flat. Bowel sounds are normal.     Palpations: Abdomen is soft.  Musculoskeletal:        General: Normal range of motion.     Cervical back: Normal range of motion.  Skin:    General: Skin is warm and dry.  Neurological:     General: No focal deficit present.     Mental Status: He is alert and oriented to person, place, and time. Mental status is at baseline.  Psychiatric:        Mood and Affect: Mood normal.        Behavior: Behavior normal.      UC Treatments / Results  Labs (all labs ordered are listed, but only abnormal results are displayed) Labs Reviewed  SARS CORONAVIRUS 2 (TAT 6-24 HRS)    EKG   Radiology DG Chest 2 View  Result Date: 07/20/2022 CLINICAL DATA:  Cough EXAM: CHEST - 2 VIEW COMPARISON:  Radiograph 07/24/2015, CT 07/17/2017 FINDINGS: The heart size and mediastinal contours are within normal limits.There is no focal airspace consolidation. There is biapical pleuroparenchymal scarring and emphysema.No pleural effusion or pneumothorax.Chronic left-sided rib injuries. IMPRESSION: No evidence of acute cardiopulmonary disease. Electronically Signed   By: Maurine Simmering M.D.   On: 07/20/2022 10:59     Procedures Procedures (including critical care time)  Medications Ordered in UC Medications  albuterol (PROVENTIL) (2.5 MG/3ML) 0.083% nebulizer solution 2.5 mg (2.5 mg Nebulization Given 07/20/22 1024)    Initial Impression / Assessment and Plan / UC Course  I have reviewed the triage vital signs and the nursing notes.  Pertinent labs & imaging results that were available during my care of the patient were reviewed by me and considered in my medical decision making (see chart for details).     Patient presents with symptoms likely from a viral upper respiratory infection. Patient is nontoxic appearing and not in need of emergent medical intervention.  COVID test pending.  His oxygen was ranging from 93 to 94% during initial triage and physical exam.  Albuterol nebulizer treatment administered with improvement of oxygen saturation to 96% and sustaining.  Chest x-ray completed that was negative for any acute cardiopulmonary process.  Patient does have COPD and I am mildly suspicious of a mild COPD exacerbation.  Will treat with prednisone.  No obvious contraindications noted to steroids in patient's history.  He does have macular degeneration but steroid burst should be safe and benefits outweigh risks.  Cough medication and albuterol inhaler prescribed for patient as well.  Patient's wife states that they have a pulse oximeter at home and encouraged him to monitor oxygen very diligently over the next few days.  Advised strict return and ER precautions. Patient and wife state understanding and are agreeable.  Discharged with PCP followup.  Final Clinical Impressions(s) / UC Diagnoses   Final diagnoses:  Viral upper respiratory tract infection with cough     Discharge Instructions      It appears that you have a viral illness that is  causing inflammation in your chest.  I have prescribed an inhaler, cough medication, steroid therapy.  Please monitor oxygen closely at home.  Go the ER if  any symptoms persist or worsen.     ED Prescriptions     Medication Sig Dispense Auth. Provider   albuterol (VENTOLIN HFA) 108 (90 Base) MCG/ACT inhaler Inhale 2 puffs into the lungs every 6 (six) hours as needed for wheezing or shortness of breath. 8 g [place], Hildred Alamin E, [place]   benzonatate (TESSALON) 100 MG capsule Take 1 capsule (100 mg total) by mouth every 8 (eight) hours as needed for cough. 21 capsule [place], [place] E, [place]   predniSONE (DELTASONE) 20 MG tablet Take 2 tablets (40 mg total) by mouth daily for 5 days. 10 tablet Teodora Medici, [place]      PDMP not reviewed this encounter.   Teodora Medici,  07/20/22 1124

## 2022-07-20 NOTE — Discharge Instructions (Signed)
It appears that you have a viral illness that is causing inflammation in your chest.  I have prescribed an inhaler, cough medication, steroid therapy.  Please monitor oxygen closely at home.  Go the ER if any symptoms persist or worsen.

## 2022-07-21 LAB — SARS CORONAVIRUS 2 (TAT 6-24 HRS): SARS Coronavirus 2: POSITIVE — AB

## 2022-07-25 ENCOUNTER — Emergency Department (HOSPITAL_COMMUNITY)
Admission: EM | Admit: 2022-07-25 | Discharge: 2022-07-25 | Disposition: A | Discharge status: Home / Self Care | Payer: Medicare HMO | Attending: Emergency Medicine | Admitting: Emergency Medicine

## 2022-07-25 ENCOUNTER — Encounter (HOSPITAL_COMMUNITY): Payer: Self-pay

## 2022-07-25 ENCOUNTER — Emergency Department (HOSPITAL_COMMUNITY): Payer: Medicare HMO

## 2022-07-25 ENCOUNTER — Other Ambulatory Visit: Payer: Self-pay

## 2022-07-25 DIAGNOSIS — R051 Acute cough: Secondary | ICD-10-CM

## 2022-07-25 DIAGNOSIS — Z85828 Personal history of other malignant neoplasm of skin: Secondary | ICD-10-CM | POA: Insufficient documentation

## 2022-07-25 DIAGNOSIS — R079 Chest pain, unspecified: Secondary | ICD-10-CM | POA: Diagnosis not present

## 2022-07-25 DIAGNOSIS — J439 Emphysema, unspecified: Secondary | ICD-10-CM | POA: Diagnosis not present

## 2022-07-25 DIAGNOSIS — R0789 Other chest pain: Secondary | ICD-10-CM | POA: Diagnosis not present

## 2022-07-25 DIAGNOSIS — R059 Cough, unspecified: Secondary | ICD-10-CM | POA: Diagnosis not present

## 2022-07-25 DIAGNOSIS — U071 COVID-19: Secondary | ICD-10-CM | POA: Diagnosis not present

## 2022-07-25 DIAGNOSIS — J449 Chronic obstructive pulmonary disease, unspecified: Secondary | ICD-10-CM | POA: Diagnosis not present

## 2022-07-25 LAB — CBC
HCT: 39.7 % (ref 39.0–52.0)
Hemoglobin: 13.2 g/dL (ref 13.0–17.0)
MCH: 30.1 pg (ref 26.0–34.0)
MCHC: 33.2 g/dL (ref 30.0–36.0)
MCV: 90.4 fL (ref 80.0–100.0)
Platelets: 239 10*3/uL (ref 150–400)
RBC: 4.39 MIL/uL (ref 4.22–5.81)
RDW: 12.9 % (ref 11.5–15.5)
WBC: 9.9 10*3/uL (ref 4.0–10.5)
nRBC: 0 % (ref 0.0–0.2)

## 2022-07-25 LAB — BASIC METABOLIC PANEL
Anion gap: 10 (ref 5–15)
BUN: 19 mg/dL (ref 8–23)
CO2: 27 mmol/L (ref 22–32)
Calcium: 8.1 mg/dL — ABNORMAL LOW (ref 8.9–10.3)
Chloride: 100 mmol/L (ref 98–111)
Creatinine, Ser: 1.23 mg/dL (ref 0.61–1.24)
GFR, Estimated: 59 mL/min — ABNORMAL LOW (ref >60)
Glucose, Bld: 91 mg/dL (ref 70–99)
Potassium: 3.3 mmol/L — ABNORMAL LOW (ref 3.5–5.1)
Sodium: 137 mmol/L (ref 135–145)

## 2022-07-25 NOTE — ED Provider Notes (Signed)
[place] EMERGENCY DEPARTMENT AT [facility] Provider Note   CSN: [id number] Arrival date & time: 07/25/22  1243     History {Add pertinent medical, surgical, social history, OB history to HPI:1} Chief Complaint  Patient presents with   Rib Cage Pain    Thomas Reyes is a 82 y.o. male.  HPI   Patient has a history of hypercholesterolemia, chest pain, squamous cell carcinoma,, prior smoking who presents to the ED for evaluation of left-sided chest pain cough.  Patient states he started having symptoms last week.  He went to another medical facility and was diagnosed with a covid infection on February 14.  Patient was given prescriptions for inhalers and steroids.  Patient states he continues to have cough and congestion and now is having some pain on the left side of his chest.  No recent fevers.  No vomiting or diarrhea.  Home Medications Prior to Admission medications   Medication Sig Start Date End Date Taking? Authorizing Provider  albuterol (VENTOLIN HFA) 108 (90 Base) MCG/ACT inhaler Inhale 2 puffs into the lungs every 6 (six) hours as needed for wheezing or shortness of breath. 07/20/22   Teodora Medici, FNP  benzonatate (TESSALON) 100 MG capsule Take 1 capsule (100 mg total) by mouth every 8 (eight) hours as needed for cough. 07/20/22   Teodora Medici, FNP  omeprazole (PRILOSEC) 20 MG capsule Take 1 capsule (20 mg total) by mouth daily. 03/16/22   Ngetich, [name] C, NP  predniSONE (DELTASONE) 20 MG tablet Take 2 tablets (40 mg total) by mouth daily for 5 days. 07/20/22 07/25/22  Teodora Medici, FNP  REPATHA SURECLICK [id number] MG/ML SOAJ INJECT 1 DOSE SUBCUTANEOUSLY EVERY 14 DAYS 09/06/21   Hilty, Nadean Corwin, MD  tamsulosin (FLOMAX) 0.4 MG CAPS capsule TAKE 1 CAPSULE EVERY DAY 12/01/21   Ngetich, [name] C, NP  valsartan (DIOVAN) 80 MG tablet TAKE 1 TABLET EVERY DAY 05/31/22   Ngetich, [name] C, NP      Allergies    Zocor [simvastatin], Ace inhibitors, Lipitor [atorvastatin],  and Pravachol [pravastatin sodium]    Review of Systems   Review of Systems  Physical Exam Updated Vital Signs BP (!) 140/68   Pulse 60   Temp 98 F (36.7 C) (Oral)   Resp 17   Ht 1.702 m (5' 7"$ )   Wt 58.1 kg   SpO2 96%   BMI 20.05 kg/m  Physical Exam Vitals and nursing note reviewed.  Constitutional:      Appearance: He is well-developed. He is not diaphoretic.  HENT:     Head: Normocephalic and atraumatic.     Right Ear: External ear normal.     Left Ear: External ear normal.  Eyes:     General: No scleral icterus.       Right eye: No discharge.        Left eye: No discharge.     Conjunctiva/sclera: Conjunctivae normal.  Neck:     Trachea: No tracheal deviation.  Cardiovascular:     Rate and Rhythm: Normal rate and regular rhythm.  Pulmonary:     Effort: Pulmonary effort is normal. No respiratory distress.     Breath sounds: Normal breath sounds. No stridor. No wheezing or rales.  Abdominal:     General: Bowel sounds are normal. There is no distension.     Palpations: Abdomen is soft.     Tenderness: There is no abdominal tenderness. There is no guarding or rebound.  Musculoskeletal:  General: No tenderness or deformity.     Cervical back: Neck supple.  Skin:    General: Skin is warm and dry.     Findings: No rash.  Neurological:     General: No focal deficit present.     Mental Status: He is alert.     Cranial Nerves: No cranial nerve deficit, dysarthria or facial asymmetry.     Sensory: No sensory deficit.     Motor: No abnormal muscle tone or seizure activity.     Coordination: Coordination normal.  Psychiatric:        Mood and Affect: Mood normal.     ED Results / Procedures / Treatments   Labs (all labs ordered are listed, but only abnormal results are displayed) Labs Reviewed  CBC  BASIC METABOLIC PANEL    EKG None  Radiology No results found.  Procedures Procedures  {Document cardiac monitor, telemetry assessment procedure when  appropriate:1}  Medications Ordered in ED Medications - No data to display  ED Course/ Medical Decision Making/ A&P   {   Click here for ABCD2, HEART and other calculatorsREFRESH Note before signing :1}                          Medical Decision Making Amount and/or Complexity of Data Reviewed Labs: ordered. Radiology: ordered.   ***  {Document critical care time when appropriate:1} {Document review of labs and clinical decision tools ie heart score, Chads2Vasc2 etc:1}  {Document your independent review of radiology images, and any outside records:1} {Document your discussion with family members, caretakers, and with consultants:1} {Document social determinants of health affecting pt's care:1} {Document your decision making why or why not admission, treatments were needed:1} Final Clinical Impression(s) / ED Diagnoses Final diagnoses:  None    Rx / DC Orders ED Discharge Orders     None

## 2022-07-25 NOTE — Discharge Instructions (Signed)
Continue the medications you are prescribed previously.  Your x-ray does not show any evidence of pneumonia.  The cough and chest discomfort is related to your COVID infection.  Follow-up with your doctor to be rechecked.  Return for shortness of breath worsening symptoms.

## 2022-07-25 NOTE — ED Triage Notes (Signed)
Patient tested positive for Covid Wednesday. Worsening cough and left rib cage pain.

## 2022-07-28 ENCOUNTER — Telehealth: Payer: Self-pay

## 2022-07-28 NOTE — Telephone Encounter (Signed)
Patient wife has some concern about her husband breathing which the patient does has an appointment to be seen on tomorrow. I did advise the patient wife if he is feeling worse that he needs to go to the emergency room.

## 2022-07-29 ENCOUNTER — Encounter: Payer: Self-pay | Admitting: Internal Medicine

## 2022-07-29 ENCOUNTER — Ambulatory Visit (INDEPENDENT_AMBULATORY_CARE_PROVIDER_SITE_OTHER): Payer: Medicare HMO | Admitting: Internal Medicine

## 2022-07-29 VITALS — BP 136/74 | HR 64 | Temp 97.8°F | Resp 16 | Ht 67.0 in | Wt 124.8 lb

## 2022-07-29 DIAGNOSIS — J209 Acute bronchitis, unspecified: Secondary | ICD-10-CM

## 2022-07-29 DIAGNOSIS — U099 Post covid-19 condition, unspecified: Secondary | ICD-10-CM

## 2022-07-29 DIAGNOSIS — J449 Chronic obstructive pulmonary disease, unspecified: Secondary | ICD-10-CM

## 2022-07-29 MED ORDER — DOXYCYCLINE HYCLATE 100 MG PO TABS: 100.0000 mg | ORAL_TABLET | Freq: Two times a day (BID) | ORAL | 0 refills | Status: DC

## 2022-07-29 MED ORDER — PREDNISONE 10 MG PO TABS: 10.0000 mg | ORAL_TABLET | Freq: Every day | ORAL | 0 refills | Status: DC

## 2022-07-29 MED ORDER — ALBUTEROL SULFATE HFA 108 (90 BASE) MCG/ACT IN AERS: 2.0000 | INHALATION_SPRAY | Freq: Four times a day (QID) | RESPIRATORY_TRACT | 0 refills | Status: DC | PRN

## 2022-07-29 NOTE — Patient Instructions (Signed)
Can take Proair 2-3 times a day

## 2022-07-29 NOTE — Progress Notes (Signed)
Location:  [facility]   Place of Service:   Office  Provider:   Code Status:  Goals of Care:     07/29/2022   10:03 AM  Advanced Directives  Does Patient Have a Medical Advance Directive? Yes  Type of Advance Directive Living will;Healthcare Power of [place] in Chart? No - copy requested     Chief Complaint  Patient presents with   Acute Visit    Patient is recovering from covid and still having shortness or breath and coughing up phlegm. He has not ate much in the last 4 days    HPI: Patient is a 82 y.o. male seen today for an acute visit for Productive Cough and SOB  Patient lives with his wife Has h/o COPD ,Hypertension, Hypertension And GERD  He got diagnosed with Covid on Feb 14 Treated with Prednisone and Inhaler He continues ot have SOB and now coughing up Yellow and Greenish Sputum Having pain in his Left Side when Coughs No Fever or chills Poor Appetite   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   Chest pain, unspecified 08/20/2012   Chest pain, unspecified 12/26/2011   Concussion, unspecified 10/30/1994   COPD (chronic obstructive pulmonary disease) ([place]) 02/24/2006   Corns and callosities 04/11/2007   Dermatophytosis of the body    Disturbance of skin sensation    Diverticulosis of colon (without mention of hemorrhage) 06/14/2010   Edema of male genital organs 08/20/2012   Elevated blood pressure reading without diagnosis of hypertension 06/14/2010   Encounter for long-term (current) use of other medications    Hypercholesterolemia    Hypertrophy of prostate without urinary obstruction and other lower urinary tract symptoms (LUTS)    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   Lumbago 05/11/2009   Nocturia 08/20/2012   Other and unspecified hyperlipidemia 02/24/2006   Pain in joint, ankle and foot 04/11/2007   Pterygium,  unspecified 03/01/2006   Rash and other nonspecific skin eruption 12/28/1999   Seborrheic dermatitis, unspecified 03/21/2001   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   Tobacco use disorder    Ulcer of other part of foot 03/16/2007   Unspecified constipation 05/11/2009   Urinary tract infection, site not specified 05/11/2009   Ventral hernia, unspecified, without mention of obstruction or gangrene     Past Surgical History:  Procedure Laterality Date   ELBOW SURGERY Right    EYE SURGERY     Cataract Removal   FRACTURE SURGERY  1986   right hip, right elbow   KNEE SURGERY Right 01/2007   knee cap injury   SKIN CANCER EXCISION  07/2014   Back and head    TONSILLECTOMY  1948    Allergies  Allergen Reactions   Zocor [Simvastatin] Shortness Of Breath    Sob and itching   Ace Inhibitors Cough    Tolerating ARB instead   Lipitor [Atorvastatin]     Muscle and bone pain   Pravachol [Pravastatin Sodium] Other (See Comments)    myalgias    Outpatient Encounter Medications as of 07/29/2022  Medication Sig   benzonatate (TESSALON) 100 MG capsule Take 1 capsule (100 mg total) by mouth every 8 (eight) hours as needed for cough.   doxycycline (VIBRA-TABS) 100 MG tablet Take 1 tablet (100 mg total) by mouth 2 (two) times daily.   omeprazole (PRILOSEC) 20  MG capsule Take 1 capsule (20 mg total) by mouth daily.   predniSONE (DELTASONE) 10 MG tablet Take 1 tablet (10 mg total) by mouth daily with breakfast. Take 4 tablets for 3 days 3 tablets for 3 days 2 tablets for 3 days and 1 tablet for 3 days   REPATHA SURECLICK [id number] MG/ML SOAJ INJECT 1 DOSE SUBCUTANEOUSLY EVERY 14 DAYS   tamsulosin (FLOMAX) 0.4 MG CAPS capsule TAKE 1 CAPSULE EVERY DAY   valsartan (DIOVAN) 80 MG tablet TAKE 1 TABLET EVERY DAY   [DISCONTINUED] albuterol (VENTOLIN HFA) 108 (90 Base) MCG/ACT inhaler Inhale 2 puffs into the lungs every 6 (six) hours as needed for wheezing or shortness  of breath.   albuterol (VENTOLIN HFA) 108 (90 Base) MCG/ACT inhaler Inhale 2 puffs into the lungs every 6 (six) hours as needed for wheezing or shortness of breath.   No facility-administered encounter medications on file as of 07/29/2022.    Review of Systems:  Review of Systems  Constitutional:  Positive for activity change and appetite change. Negative for unexpected weight change.  HENT: Negative.    Respiratory:  Positive for cough and shortness of breath.   Cardiovascular:  Positive for chest pain. Negative for leg swelling.  Gastrointestinal:  Negative for constipation.  Genitourinary:  Negative for frequency.  Musculoskeletal:  Negative for arthralgias, gait problem and myalgias.  Skin: Negative.  Negative for rash.  Neurological:  Negative for dizziness and weakness.  Psychiatric/Behavioral:  Negative for confusion and sleep disturbance.   All other systems reviewed and are negative.   Health Maintenance  Topic Date Due   Zoster Vaccines- Shingrix (1 of 2) Never done   Medicare Annual Wellness (AWV)  11/11/2022   DTaP/Tdap/Td (4 - Td or Tdap) 01/23/2032   Pneumonia Vaccine 43+ Years old  Completed   INFLUENZA VACCINE  Completed   HPV VACCINES  Aged Out   COVID-19 Vaccine  Discontinued    Physical Exam: Vitals:   07/29/22 1000  BP: 136/74  Pulse: 64  Resp: 16  Temp: 97.8 F (36.6 C)  TempSrc: Temporal  SpO2: 94%  Weight: 124 lb 12.8 oz (56.6 kg)  Height: '5\' 7"'$  (1.702 m)   Body mass index is 19.55 kg/m. Physical Exam Vitals reviewed.  Constitutional:      Appearance: Normal appearance.  HENT:     Head: Normocephalic.     Nose: Nose normal.     Mouth/Throat:     Mouth: Mucous membranes are moist.     Pharynx: Oropharynx is clear.  Eyes:     Pupils: Pupils are equal, round, and reactive to light.  Cardiovascular:     Rate and Rhythm: Normal rate and regular rhythm.     Pulses: Normal pulses.     Heart sounds: No murmur heard. Pulmonary:     Effort:  Pulmonary effort is normal. No respiratory distress.     Breath sounds: Has Rales in the Left Lower base Abdominal:     General: Abdomen is flat. Bowel sounds are normal.     Palpations: Abdomen is soft.  Musculoskeletal:        General: No swelling.     Cervical back: Neck supple.  Skin:    General: Skin is warm.  Neurological:     General: No focal deficit present.     Mental Status: He is alert and oriented to person, place, and time.  Psychiatric:        Mood and Affect: Mood normal.  Thought Content: Thought content normal.     Labs reviewed: Basic Metabolic Panel: Recent Labs    12/31/21 0951 07/18/22 0842 07/25/22 1438  NA 135 136 137  K 5.2 4.5 3.3*  CL 100 101 100  CO2 '27 28 27  '$ GLUCOSE 88 86 91  BUN '14 14 19  '$ CREATININE 1.58* 1.44* 1.23  CALCIUM 9.5 8.8 8.1*  TSH 2.69 2.90  --     Liver Function Tests: Recent Labs    12/31/21 0951 07/18/22 0842  AST 18 18  ALT 10 6*  BILITOT 0.7 0.7  PROT 7.0 6.5    No results for input(s): "LIPASE", "AMYLASE" in the last 8760 hours. No results for input(s): "AMMONIA" in the last 8760 hours. CBC: Recent Labs    12/31/21 0951 07/18/22 0842 07/25/22 1438  WBC 5.3 8.5 9.9  NEUTROABS 3,514 7,081  --   HGB 14.9 14.3 13.2  HCT 43.3 41.9 39.7  MCV 88.0 89.7 90.4  PLT 221 203 239    Lipid Panel: Recent Labs    12/31/21 0951 07/18/22 0842  CHOL 157 142  HDL 65 61  LDLCALC 75 65  TRIG 86 79  CHOLHDL 2.4 2.3    Lab Results  Component Value Date   HGBA1C 6.0 (H) 07/18/2022    Procedures since last visit: DG Chest 2 View  Result Date: 07/25/2022 CLINICAL DATA:  Worsening cough and LEFT rib cage pain, tested positive for COVID-19 last Wednesday EXAM: CHEST - 2 VIEW COMPARISON:  07/20/2022 FINDINGS: Normal heart size, mediastinal contours, and pulmonary vascularity. BILATERAL upper lobe volume loss and biapical scarring. Emphysematous and bronchitic changes consistent with COPD. No acute infiltrate,  pleural effusion, or pneumothorax. Two rounded radiopacities project over the lower anterior LEFT chest wall. Old healed fractures of lateral LEFT seventh and eighth ribs. IMPRESSION: COPD changes with biapical scarring and BILATERAL upper lobe volume loss. No acute abnormalities. Two rounded radiopaque foreign bodies project over the lower anterior LEFT chest wall, appear to be external to the patient anteriorly. Emphysema (ICD10-J43.9). Electronically Signed   By: Lavonia Dana M.D.   On: 07/25/2022 14:19   DG Chest 2 View  Result Date: 07/20/2022 CLINICAL DATA:  Cough EXAM: CHEST - 2 VIEW COMPARISON:  Radiograph 07/24/2015, CT 07/17/2017 FINDINGS: The heart size and mediastinal contours are within normal limits.There is no focal airspace consolidation. There is biapical pleuroparenchymal scarring and emphysema.No pleural effusion or pneumothorax.Chronic left-sided rib injuries. IMPRESSION: No evidence of acute cardiopulmonary disease. Electronically Signed   By: Maurine Simmering M.D.   On: 07/20/2022 10:59    Assessment/Plan 1. 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 COPD mixed type ([place]) Use Proair Q 8 PRN   Follow up in 2 weeks with [name]   Labs/tests ordered:  * No order type specified * Next appt:  08/12/2022

## 2022-08-11 ENCOUNTER — Telehealth: Payer: Self-pay

## 2022-08-11 NOTE — Patient Outreach (Signed)
  Care Coordination   08/11/2022 Name: Thomas Reyes MRN: [id number] DOB: May 05, 1941   Care Coordination Outreach Attempts:  An unsuccessful telephone outreach was attempted today to offer the patient information about available care coordination services as a benefit of their health plan.   Follow Up Plan:  Additional outreach attempts will be made to offer the patient care coordination information and services.   Encounter Outcome:  No Answer   Care Coordination Interventions:  No, not indicated    Daneen Schick, BSW, CDP Social Worker, Certified Dementia Practitioner [facility]  Care Coordination [phone]

## 2022-08-12 ENCOUNTER — Ambulatory Visit: Payer: Medicare HMO | Admitting: Family

## 2022-08-15 ENCOUNTER — Other Ambulatory Visit: Payer: Self-pay | Admitting: Internal Medicine

## 2022-08-15 NOTE — Telephone Encounter (Signed)
Requesting refill on medication. Medication show discontinued from previous encounter. Spoke to pt, pt attest to taking ezetimibe. Sending refill.

## 2022-08-18 ENCOUNTER — Ambulatory Visit: Payer: Self-pay

## 2022-08-18 NOTE — Patient Instructions (Signed)
Visit Information  Thank you for taking time to visit with me today. Please don't hesitate to contact me if I can be of assistance to you.   Following are the goals we discussed today:   Goals Addressed             This Visit's Progress    COMPLETED: Care Coordination Activities       Care Coordination Interventions: SDoH screening performed - no acute resource challenges identified at this time Discussed patient continues to slowly recover from COVID infection; has been using inhaler regularly Determined the patient is almost out of his inhaler and would like a refill if possible Collaboration with Dr. Lyndel Safe to advise of continued SOB and request a refill be sent to [facility] mail order pharmacy if she agrees with refill Performed chart review to note future appointment with Dr. Debara Pickett on 4/1 - patient aware Education provided on the role of the [place] with the Care Coordination team - appointment scheduled for 2/28 for patient to speak with [place] with [place] to advise of interventions and plan for patient to speak with her on 2/28         Your next appointment is by telephone on 2/28 with [place]  Please call the care guide team at [phone] if you need to cancel or reschedule your appointment.   If you are experiencing a Mental Health or [place] or need someone to talk to, please call 911  The patient verbalized understanding of instructions, educational materials, and care plan provided today and DECLINED offer to receive copy of patient instructions, educational materials, and care plan.   Daneen Schick, BSW, CDP Social Worker, Certified Dementia Practitioner [place] [facility]  Care Coordination [phone]

## 2022-08-18 NOTE — Patient Outreach (Signed)
  Care Coordination   Initial Visit Note   08/18/2022 Name: Thomas Reyes MRN: [id number] DOB: 1941-04-23  Thomas Reyes is a 82 y.o. year old male who sees Ngetich, [name] C, NP for primary care. I  spoke with patients spouse by phone today.  What matters to the patients health and wellness today?  Refill of inhaler    Goals Addressed             This Visit's Progress    COMPLETED: Care Coordination Activities       Care Coordination Interventions: SDoH screening performed - no acute resource challenges identified at this time Discussed patient continues to slowly recover from COVID infection; has been using inhaler regularly Determined the patient is almost out of his inhaler and would like a refill if possible Collaboration with Dr. Lyndel Safe to advise of continued SOB and request a refill be sent to [facility] mail order pharmacy if she agrees with refill Performed chart review to note future appointment with Dr. Debara Pickett on 4/1 - patient aware Education provided on the role of the [place] with the Care Coordination team - appointment scheduled for 2/28 for patient to speak with Lenora with [place] to advise of interventions and plan for patient to speak with her on 2/28         SDOH assessments and interventions completed:  Yes  SDOH Interventions Today    Flowsheet Row Most Recent Value  SDOH Interventions   Food Insecurity Interventions Intervention Not Indicated  Housing Interventions Intervention Not Indicated  Transportation Interventions Intervention Not Indicated  Utilities Interventions Intervention Not Indicated  Financial Strain Interventions Intervention Not Indicated        Care Coordination Interventions:  Yes, provided   Interventions Today    Flowsheet Row Most Recent Value  Chronic Disease   Chronic disease during today's visit Hypertension (HTN), Chronic Obstructive Pulmonary Disease (COPD)   General Interventions   General Interventions Discussed/Reviewed General Interventions Discussed, Communication with, Doctor Visits  Doctor Visits Discussed/Reviewed Doctor Visits Reviewed  Communication with PCP/Specialists, RN        Follow up plan: Follow up call scheduled for 2/28 with RN Care Manager    Encounter Outcome:  Pt. Visit Completed   Daneen Schick, Arita Miss, CDP Social Worker, Certified Dementia Practitioner [place] [facility]  Care Coordination [phone]

## 2022-08-23 ENCOUNTER — Ambulatory Visit: Payer: Self-pay

## 2022-08-23 NOTE — Patient Outreach (Signed)
  Care Coordination   Follow Up Visit Note   08/23/2022 Name: Thomas Reyes MRN: [id number] DOB: March 15, 1941  [name] is a 82 y.o. year old male who sees Ngetich, [name] C, NP for primary care. I  spoke with patients spouse by phone to discuss opportunity for patient to be seen by a pulmonologist to maximize COPD therapy.  What matters to the patients health and wellness today?  For shortness of breath to improve    Goals Addressed             This Visit's Progress    COMPLETED: Care Coordination Activities       Care Coordination Interventions: Collaboration with Dr. Lyndel Safe who indicates she will send a refill for an inhaler to [facility] mail order pharmacy. Dr. Lyndel Safe inquired if patient would be interested in a referral to pulmonary in order to maximize COPD therapy  Spoke with patients spouse who indicates patient used to be followed by Dr. Melvyn Novas but has not been seen in quite some time Performed chart review to note patient last seen by Dr. Melvyn Novas on 2017 Discussed plan for spouse to speak with the patient when he returns home to inquire if he would like to engage with pulmonary again; patient will contact SW or his primary care provider to request a referral if desired Collaboration with Dr. Lyndel Safe to advise of plan for patient to request a referral if desired Collaboration with [place] to advise of interventions and plan         SDOH assessments and interventions completed:  No     Care Coordination Interventions:  Yes, provided   Interventions Today    Flowsheet Row Most Recent Value  Chronic Disease   Chronic disease during today's visit Chronic Obstructive Pulmonary Disease (COPD), Hypertension (HTN)  General Interventions   General Interventions Discussed/Reviewed General Interventions Reviewed, Communication with, Doctor Visits  Doctor Visits Discussed/Reviewed Doctor Visits Discussed, Specialist  [Reviewed opportunity to engage with  pulmonology]  Communication with PCP/Specialists, RN        Follow up plan:  RN Care Manager will contact patient on 3/28 as previously planned.    Encounter Outcome:  Pt. Visit Completed   Daneen Schick, BSW, CDP Social Worker, Certified Dementia Practitioner [place] [facility]  Care Coordination [phone]

## 2022-08-23 NOTE — Patient Instructions (Signed)
Visit Information  Thank you for taking time to visit with me today. Please don't hesitate to contact me if I can be of assistance to you.   Following are the goals we discussed today:   Goals Addressed             This Visit's Progress    COMPLETED: Care Coordination Activities       Care Coordination Interventions: Collaboration with Dr. Lyndel Safe who indicates she will send a refill for an inhaler to [facility] mail order pharmacy. Dr. Lyndel Safe inquired if patient would be interested in a referral to pulmonary in order to maximize COPD therapy  Spoke with patients spouse who indicates patient used to be followed by Dr. Melvyn Novas but has not been seen in quite some time Performed chart review to note patient last seen by Dr. Melvyn Novas on 2017 Discussed plan for spouse to speak with the patient when he returns home to inquire if he would like to engage with pulmonary again; patient will contact SW or his primary care provider to request a referral if desired Collaboration with Dr. Lyndel Safe to advise of plan for patient to request a referral if desired Collaboration with [place] to advise of interventions and plan         Your next appointment is by telephone on 3/28 with Barb Merino, [place]  Please call the care guide team at [phone] if you need to cancel or reschedule your appointment.   If you are experiencing a Mental Health or [place] or need someone to talk to, please call 911  The patient verbalized understanding of instructions, educational materials, and care plan provided today and DECLINED offer to receive copy of patient instructions, educational materials, and care plan.   Daneen Schick, BSW, CDP Social Worker, Certified Dementia Practitioner [place] [facility]  Care Coordination [phone]

## 2022-09-01 ENCOUNTER — Ambulatory Visit: Payer: Self-pay

## 2022-09-01 NOTE — Patient Outreach (Signed)
  Care Coordination   Initial Visit Note   09/01/2022 Name: Thomas Reyes MRN: [id number] DOB: [birth year]  Thomas Reyes is a 82 y.o. year old male who sees Ngetich, [name] C, NP for primary care. I spoke with wife Thomas Reyes by phone today.  What matters to the patients health and wellness today?  To be determined    Goals Addressed             This Visit's Progress    RN Care Coordination Activities: further follow up needed       Care Coordination Interventions: Evaluation of current treatment plan related to [place] and patient's adherence to plan as established by provider Completed call with patient's wife Determined patient would like to speak with me but is currently not available  Rescheduled call for 09/05/22 @2 :00 PM           SDOH assessments and interventions completed:  No     Care Coordination Interventions:  Yes, provided   Follow up plan: Follow up call scheduled for 09/05/22 @2 :00 PM    Encounter Outcome:  Pt. Visit Completed

## 2022-09-01 NOTE — Patient Instructions (Signed)
Visit Information  Thank you for taking time to visit with me today. Please don't hesitate to contact me if I can be of assistance to you.   Following are the goals we discussed today:   Goals Addressed             This Visit's Progress    RN Care Coordination Activities: further follow up needed       Care Coordination Interventions: Evaluation of current treatment plan related to [place] and patient's adherence to plan as established by provider Completed call with patient's wife Determined patient would like to speak with me but is currently not available  Rescheduled call for 09/05/22 @2 :00 PM           Our next appointment is by telephone on 09/05/22 at 2:00 PM   Please call the care guide team at [phone] if you need to cancel or reschedule your appointment.   If you are experiencing a Mental Health or [place] or need someone to talk to, please call 1-800-273-TALK (toll free, 24 hour hotline) go to [facility] [place] [phone])  The patient verbalized understanding of instructions, educational materials, and care plan provided today and DECLINED offer to receive copy of patient instructions, educational materials, and care plan.   Barb Merino, RN, BSN, CCM Care Management Coordinator [facility] Care Management  Direct Phone: [phone]

## 2022-09-05 ENCOUNTER — Encounter: Payer: Self-pay | Admitting: Internal Medicine

## 2022-09-05 ENCOUNTER — Ambulatory Visit: Payer: Medicare HMO | Attending: Internal Medicine | Admitting: Internal Medicine

## 2022-09-05 ENCOUNTER — Ambulatory Visit: Payer: Self-pay

## 2022-09-05 ENCOUNTER — Other Ambulatory Visit: Payer: Self-pay | Admitting: Internal Medicine

## 2022-09-05 VITALS — BP 164/80 | HR 68 | Ht 67.0 in | Wt 126.6 lb

## 2022-09-05 DIAGNOSIS — T466X5D Adverse effect of antihyperlipidemic and antiarteriosclerotic drugs, subsequent encounter: Secondary | ICD-10-CM | POA: Diagnosis not present

## 2022-09-05 DIAGNOSIS — M791 Myalgia, unspecified site: Secondary | ICD-10-CM

## 2022-09-05 DIAGNOSIS — R0609 Other forms of dyspnea: Secondary | ICD-10-CM

## 2022-09-05 DIAGNOSIS — R931 Abnormal findings on diagnostic imaging of heart and coronary circulation: Secondary | ICD-10-CM

## 2022-09-05 DIAGNOSIS — E785 Hyperlipidemia, unspecified: Secondary | ICD-10-CM

## 2022-09-05 DIAGNOSIS — E7800 Pure hypercholesterolemia, unspecified: Secondary | ICD-10-CM

## 2022-09-05 NOTE — Progress Notes (Signed)
LIPID CLINIC CONSULT NOTE  Chief Complaint:  Follow-up dyslipidemia  Primary Care Physician: Ngetich, Nelda Bucks, NP  Primary Cardiologist:  None  HPI:  Thomas Reyes is a 82 y.o. male who is being seen today for the evaluation of dyslipidemia at the request of Ngetich, [name] C, NP.  This is a pleasant 82 year old male with a history of tobacco abuse, prior pack per day smoker for 40 to 50 years, with history of COPD not on inhalers, who recently had progressive symptoms of dyspnea on exertion and underwent nuclear stress testing in our office several days ago which was negative for ischemia.  He was referred for lipid management due to persistent dyslipidemia.  He reports has been off of statins for about 3 years due to significant myalgias.  In the past he has tried Lipitor, Zocor and Pravachol, all of which caused side effects.  His total cholesterol was 269, HDL 50, triglycerides 135 and LDL 192.  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  In addition he had a CT scan of the chest last year which showed multivessel coronary calcium and aortic atherosclerosis.  Based on these findings he needs significant, in fact greater than 50% reduction in LDL cholesterol due to recent consensus guidelines in 2018.  Since he is unable to tolerate high potency statins, he would not likely reach these targets without a PCSK9 inhibitor.  03/26/2021  Thomas Reyes returns today for routine follow-up.  His lipids have gone up slightly.  Total cholesterol now 159, triglycerides 107, HDL 56 and LDL 84.  Previous LDL was 69 about a year ago.  He reports compliance with Repatha which was a cost issue for a while but that has improved and ezetimibe.  I suspect there are dietary issues for this.  He reports being less active.  He denies any chest pain or worsening shortness of breath.  He does get some occasional issues with his COPD primarily in the summer for which she has to use inhalers more  frequently.  09/05/2022  Thomas Reyes returns today for lipid follow-up.  His cholesterol continues to be very well-controlled on combination of Repatha and ezetimibe.  Total cholesterol now 142, down from 157, triglycerides 79, HDL 61 and LDL 65.  He reports he is getting some shortness of breath.  He said he had COVID in January and did well with an albuterol inhaler.  He does have some underlying COPD but he is not on any maintenance inhaler.  His shortness of breath he says has been fairly stable and mostly when walking up hills.  He denies any chest pressure but as noted previously he does have multivessel coronary calcification.  PMHx:  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   Chest pain, unspecified 08/20/2012   Chest pain, unspecified 12/26/2011   Concussion, unspecified 10/30/1994   COPD (chronic obstructive pulmonary disease) 02/24/2006   Corns and callosities 04/11/2007   Dermatophytosis of the body    Disturbance of skin sensation    Diverticulosis of colon (without mention of hemorrhage) 06/14/2010   Edema of male genital organs 08/20/2012   Elevated blood pressure reading without diagnosis of hypertension 06/14/2010   Encounter for long-term (current) use of other medications    Hypercholesterolemia    Hypertrophy of prostate without urinary obstruction and other lower urinary tract symptoms (LUTS)    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   Lumbago 05/11/2009   Nocturia  08/20/2012   Other and unspecified hyperlipidemia 02/24/2006   Pain in joint, ankle and foot 04/11/2007   Pterygium, unspecified 03/01/2006   Rash and other nonspecific skin eruption 12/28/1999   Seborrheic dermatitis, unspecified 03/21/2001   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   Tobacco use disorder    Ulcer of other part of foot 03/16/2007   Unspecified constipation  05/11/2009   Urinary tract infection, site not specified 05/11/2009   Ventral hernia, unspecified, without mention of obstruction or gangrene     Past Surgical History:  Procedure Laterality Date   ELBOW SURGERY Right    EYE SURGERY     Cataract Removal   FRACTURE SURGERY  1986   right hip, right elbow   KNEE SURGERY Right 01/2007   knee cap injury   SKIN CANCER EXCISION  07/2014   Back and head    TONSILLECTOMY  1948    FAMHx:  Family History  Problem Relation Age of Onset   Diabetes Mother    Hypertension Mother    Cancer Father     SOCHx:   reports that he quit smoking about 12 years ago. His smoking use included cigarettes. He has never used smokeless tobacco. He reports that he does not drink alcohol and does not use drugs.  ALLERGIES:  Allergies  Allergen Reactions   Zocor [Simvastatin] Shortness Of Breath    Sob and itching   Ace Inhibitors Cough    Tolerating ARB instead   Lipitor [Atorvastatin]     Muscle and bone pain   Pravachol [Pravastatin Sodium] Other (See Comments)    myalgias    ROS: Pertinent items noted in HPI and remainder of comprehensive ROS otherwise negative.  HOME MEDS: Current Outpatient Medications on File Prior to Visit  Medication Sig Dispense Refill   albuterol (VENTOLIN HFA) 108 (90 Base) MCG/ACT inhaler Inhale 2 puffs into the lungs every 6 (six) hours as needed for wheezing or shortness of breath. 8 g 0   ezetimibe (ZETIA) 10 MG tablet TAKE 1 TABLET EVERY DAY 90 tablet 3   omeprazole (PRILOSEC) 20 MG capsule Take 1 capsule (20 mg total) by mouth daily. 90 capsule 3   REPATHA SURECLICK [id number] MG/ML SOAJ INJECT 1 DOSE SUBCUTANEOUSLY EVERY 14 DAYS 2 mL 11   tamsulosin (FLOMAX) 0.4 MG CAPS capsule TAKE 1 CAPSULE EVERY DAY 90 capsule 2   valsartan (DIOVAN) 80 MG tablet TAKE 1 TABLET EVERY DAY 90 tablet 1   No current facility-administered medications on file prior to visit.    LABS/IMAGING: No results found for this or any previous  visit (from the past 48 hour(s)). No results found.  LIPID PANEL:    Component Value Date/Time   CHOL 142 07/18/2022 0842   CHOL 159 03/17/2021 0943   TRIG 79 07/18/2022 0842   HDL 61 07/18/2022 0842   HDL 56 03/17/2021 0943   CHOLHDL 2.3 07/18/2022 0842   VLDL 16 11/01/2016 0807   LDLCALC 65 07/18/2022 0842    WEIGHTS: Wt Readings from Last 3 Encounters:  09/05/22 126 lb 9.6 oz (57.4 kg)  07/29/22 124 lb 12.8 oz (56.6 kg)  07/25/22 128 lb (58.1 kg)    VITALS: BP (!) 164/80   Pulse 68   Ht 5\' 7"  (1.702 m)   Wt 126 lb 9.6 oz (57.4 kg)   SpO2 98%   BMI 19.83 kg/m   EXAM: Deferred  EKG: Deferred  ASSESSMENT: DOE Multivessel coronary artery calcification  Longstanding tobacco abuse with probable COPD, on inhalers Recent progressive dyspnea on exertion-negative Myoview stress test (06/2019) Essential hypertension Statin myalgias  PLAN: 1.   Thomas Reyes has dyspnea on exertion, particular when walking up hills.  He denies any chest pressure associated with this.  His shortness of breath was worse when he had COVID on top of his COPD but improved with albuterol but he says now he uses it rarely.  He also is not using any maintenance inhaler.  His symptoms seem atypical for cardiac chest pain.  He did have a Myoview stress test in 2021 which was negative.  Advised him to continue to monitor his symptoms and if his shortness of breath become significantly worse we could consider a repeat ischemic evaluation or pulmonary function testing.  Lipids are well-controlled.  Plan follow-up with me in 6 months or sooner as necessary.  Pixie Casino, MD, [facility], [place] Director of the Advanced Lipid Disorders &  Cardiovascular Risk Reduction Clinic Diplomate of the American Board of Clinical Lipidology Attending Cardiologist  Direct Dial: [phone]  Fax: [phone]  Website:  [email or web address] Osgood Thomas Reyes 09/05/2022, 10:55 AM

## 2022-09-05 NOTE — Patient Instructions (Signed)
Medication Instructions:  NO CHANGES  *If you need a refill on your cardiac medications before your next appointment, please call your pharmacy*   Lab Work: FASTING lab work to check cholesterol in 6 months  If you have labs (blood work) drawn today and your tests are completely normal, you will receive your results only by: [place] (if you have MyChart) OR A paper copy in the mail If you have any lab test that is abnormal or we need to change your treatment, we will call you to review the results.   Testing/Procedures: NONE   Follow-Up: At [facility], you and your health needs are our priority.  As part of our continuing mission to provide you with exceptional heart care, we have created designated Provider Care Teams.  These Care Teams include your primary Cardiologist (physician) and Advanced Practice Providers (APPs -  Physician Assistants and Nurse Practitioners) who all work together to provide you with the care you need, when you need it.  We recommend signing up for the patient portal called "MyChart".  Sign up information is provided on this After Visit Summary.  MyChart is used to connect with patients for Virtual Visits (Telemedicine).  Patients are able to view lab/test results, encounter notes, upcoming appointments, etc.  Non-urgent messages can be sent to your provider as well.   To learn more about what you can do with MyChart, go to [email or web address].    Your next appointment:   6 month(s)  Provider:   Lyman Bishop MD - lipid clinic

## 2022-09-05 NOTE — Patient Instructions (Signed)
Visit Information  Thank you for taking time to visit with me today. Please don't hesitate to contact me if I can be of assistance to you.   Following are the goals we discussed today:   Goals Addressed             This Visit's Progress    COMPLETED: RN Care Coordination Activities: further follow up needed       Care Coordination Interventions: Evaluation of current treatment plan related to [place] and patient's adherence to plan as established by provider Completed call with patient's wife Determined patient would like to speak with me but is currently not available  Rescheduled call for 09/05/22 @2 :00 PM        To monitor BP at home more often       Care Coordination Interventions: Completed successful outbound call with wife Bethena Roys  Evaluation of current treatment plan related to hypertension self management and patient's adherence to plan as established by provider Advised patient, providing education and rationale, to monitor blood pressure daily and record, calling PCP for findings outside established parameters Educated wife about target BP <130/80 Provided verbal and written instructions on how to accurately check BP at home  Mailed printed educational materials related to Why Should I Limit Sodium?         To use Albuterol inhaler as directed for shortness of breath       Care Coordination Interventions: Completed successful outbound call with wife Bethena Roys  Provided patient with basic written and verbal COPD education on self care/management/and exacerbation prevention Provided written and verbal instructions on pursed lip breathing and utilized returned demonstration as teach back Provided instruction about proper use of medications used for management of COPD including inhalers Advised patient to self assesses COPD action plan zone and make appointment with provider if in the yellow zone for 48 hours without improvement Determined patient will consider contacting Dr.  Melvyn Novas, Pulmonologist for re-establishment of care for management of COPD           Our next appointment is by telephone on 10/10/22 at 12:30 PM  Please call the care guide team at [phone] if you need to cancel or reschedule your appointment.   If you are experiencing a Mental Health or [place] or need someone to talk to, please call 1-800-273-TALK (toll free, 24 hour hotline) go to [facility] [place] [phone])  The patient verbalized understanding of instructions, educational materials, and care plan provided today and DECLINED offer to receive copy of patient instructions, educational materials, and care plan.   Barb Merino, RN, BSN, CCM Care Management Coordinator [facility] Care Management  Direct Phone: [phone]

## 2022-09-05 NOTE — Patient Outreach (Signed)
  Care Coordination   Follow Up Visit Note   09/05/2022 Name: Thomas Reyes MRN: [id number] DOB: July 28, 1940  Thomas Reyes is a 82 y.o. year old male who sees Ngetich, [name] C, NP for primary care. I spoke with wife Thomas Reyes by phone today.  What matters to the patients health and wellness today?  Patient will monitor his BP three times weekly and record his readings. He will use his Albuterol inhaler exactly as directed.     Goals Addressed             This Visit's Progress    COMPLETED: RN Care Coordination Activities: further follow up needed       Care Coordination Interventions: Evaluation of current treatment plan related to [place] and patient's adherence to plan as established by provider Completed call with patient's wife Determined patient would like to speak with me but is currently not available  Rescheduled call for 09/05/22 @2 :00 PM        To monitor BP at home more often       Care Coordination Interventions: Completed successful outbound call with wife Thomas Reyes  Evaluation of current treatment plan related to hypertension self management and patient's adherence to plan as established by provider Advised patient, providing education and rationale, to monitor blood pressure daily and record, calling PCP for findings outside established parameters Educated wife about target BP <130/80 Provided verbal and written instructions on how to accurately check BP at home  Mailed printed educational materials related to Why Should I Limit Sodium?         To use Albuterol inhaler as directed for shortness of breath       Care Coordination Interventions: Completed successful outbound call with wife Thomas Reyes  Provided patient with basic written and verbal COPD education on self care/management/and exacerbation prevention Provided written and verbal instructions on pursed lip breathing and utilized returned demonstration as teach back Provided instruction about proper use  of medications used for management of COPD including inhalers Advised patient to self assesses COPD action plan zone and make appointment with provider if in the yellow zone for 48 hours without improvement Determined patient will consider contacting Dr. Melvyn Novas, Pulmonologist for re-establishment of care for management of COPD       Interventions Today    Flowsheet Row Most Recent Value  Chronic Disease   Chronic disease during today's visit Hypertension (HTN)  General Interventions   General Interventions Discussed/Reviewed General Interventions Discussed, General Interventions Reviewed  Doctor Visits Discussed/Reviewed Doctor Visits Discussed, Doctor Visits Reviewed, PCP, Specialist  Durable Medical Equipment (DME) BP Cuff  Education Interventions   Education Provided Provided Education  Provided Verbal Education On Medication, When to see the doctor, Nutrition  Nutrition Interventions   Nutrition Discussed/Reviewed Nutrition Discussed  Pharmacy Interventions   Pharmacy Dicussed/Reviewed Medications and their functions, Pharmacy Topics Discussed, Medication Adherence  Medication Adherence Not taking medication  [patient is not using his Albuterol as directed]          SDOH assessments and interventions completed:  No     Care Coordination Interventions:  Yes, provided   Follow up plan: Follow up call scheduled for 10/10/22 @12 :30 PM     Encounter Outcome:  Pt. Visit Completed

## 2022-10-03 ENCOUNTER — Telehealth: Payer: Self-pay | Admitting: Internal Medicine

## 2022-10-03 NOTE — Telephone Encounter (Signed)
Pt c/o Shortness Of Breath: STAT if SOB developed within the last 24 hours or pt is noticeably SOB on the phone  1. Are you currently SOB (can you hear that pt is SOB on the phone)? No  2. How long have you been experiencing SOB?   Within the last month but has gotten worse  3. Are you SOB when sitting or when up moving around?   Both sitting and when up and moving around  4. Are you currently experiencing any other symptoms?  No   Wife stated patient has a cold but is concerned patient SOB is getting worse.  Wife stated patient feels like he can't catch his breath.  Wife reported patient's oxygen level was 93, HR 112 and went back down to 80's when he sat down.  Wife stated patient is hurting on his left side as well.

## 2022-10-03 NOTE — Telephone Encounter (Signed)
Patient wife states SOB increased since last OV on the 2nd. He does have cold or allergies at this time, had this for 2-3 weeks.  Had taken OTC cold and cough last week but did not help so he stopped taking, nor taking allergy medication.  Does take tylenol for aches.  He is having cough as well which has worsened since last OV. Productive cough with thick yellow mucous. When walking to mailbox he gets winded and states "no air". He is laying down as just difficult with walking.  At times at rest he still states "no air" O2 sats this am 93%.  With HR 112.  She states it is staying above 90s. She states it looks like he is breathing through his mouth, not nose.  She states when he walks around, she can him breathing as well. He tends to get anxious when he can't breath  Please advise if needs sooner appt. Or other recommendations.

## 2022-10-04 NOTE — Telephone Encounter (Signed)
Spoke with patient 's wife and she will let patient know to contact pcp or go to urgent care.

## 2022-10-10 ENCOUNTER — Ambulatory Visit: Payer: Self-pay

## 2022-10-10 ENCOUNTER — Encounter: Payer: Self-pay | Admitting: Adult Health

## 2022-10-10 ENCOUNTER — Ambulatory Visit (INDEPENDENT_AMBULATORY_CARE_PROVIDER_SITE_OTHER): Payer: Medicare HMO | Admitting: Adult Health

## 2022-10-10 VITALS — BP 127/88 | HR 77 | Temp 97.6°F | Resp 18 | Ht 67.0 in | Wt 123.0 lb

## 2022-10-10 DIAGNOSIS — J449 Chronic obstructive pulmonary disease, unspecified: Secondary | ICD-10-CM | POA: Diagnosis not present

## 2022-10-10 DIAGNOSIS — J209 Acute bronchitis, unspecified: Secondary | ICD-10-CM

## 2022-10-10 MED ORDER — DOXYCYCLINE HYCLATE 100 MG PO TABS: 100.0000 mg | ORAL_TABLET | Freq: Two times a day (BID) | ORAL | 0 refills | Status: AC

## 2022-10-10 MED ORDER — ALBUTEROL SULFATE HFA 108 (90 BASE) MCG/ACT IN AERS: 2.0000 | INHALATION_SPRAY | Freq: Four times a day (QID) | RESPIRATORY_TRACT | 0 refills | Status: DC | PRN

## 2022-10-10 NOTE — Progress Notes (Signed)
[facility]  Provider: Kenard Gower DNP  Code Status:  Full Code  Goals of Care:     07/29/2022   10:03 AM  Advanced Directives  Does Patient Have a Medical Advance Directive? Yes  Type of Advance Directive Living will;Healthcare Power of Attorney  Copy of Healthcare Power of Attorney in Chart? No - copy requested     Chief Complaint  Patient presents with   Acute Visit    Shortness of breath after a sickness     HPI: Patient is a 82 y.o. male seen today for an acute visit for shortness of breath. He was accompanied today by his wife. He has productive cough with yellowish phlegm for a month now. He has chills but no fever. He has been occasionally taking Mucinex. He stated that he has wheezing sometimes. He has PRN Albuterol HFA and needs prescription.    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   Chest pain, unspecified 08/20/2012   Chest pain, unspecified 12/26/2011   Concussion, unspecified 10/30/1994   COPD (chronic obstructive pulmonary disease) (HCC) 02/24/2006   Corns and callosities 04/11/2007   Dermatophytosis of the body    Disturbance of skin sensation    Diverticulosis of colon (without mention of hemorrhage) 06/14/2010   Edema of male genital organs 08/20/2012   Elevated blood pressure reading without diagnosis of hypertension 06/14/2010   Encounter for long-term (current) use of other medications    Hypercholesterolemia    Hypertrophy of prostate without urinary obstruction and other lower urinary tract symptoms (LUTS)    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   Lumbago 05/11/2009   Nocturia 08/20/2012   Other and unspecified hyperlipidemia 02/24/2006   Pain in joint, ankle and foot 04/11/2007   Pterygium, unspecified 03/01/2006   Rash and other nonspecific skin eruption 12/28/1999   Seborrheic dermatitis, unspecified 03/21/2001   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 12/26/2011   Tobacco use disorder    Ulcer of other part of foot 03/16/2007   Unspecified constipation 05/11/2009   Urinary tract infection, site not specified 05/11/2009   Ventral hernia, unspecified, without mention of obstruction or gangrene     Past Surgical History:  Procedure Laterality Date   ELBOW SURGERY Right    EYE SURGERY     Cataract Removal   FRACTURE SURGERY  1986   right hip, right elbow   KNEE SURGERY Right 01/2007   knee cap injury   SKIN CANCER EXCISION  07/2014   Back and head    TONSILLECTOMY  1948    Allergies  Allergen Reactions   Zocor [Simvastatin] Shortness Of Breath    Sob and itching   Ace Inhibitors Cough    Tolerating ARB instead   Lipitor [Atorvastatin]     Muscle and bone pain   Pravachol [Pravastatin Sodium] Other (See Comments)    myalgias    Outpatient Encounter Medications as of 10/10/2022  Medication Sig   albuterol (VENTOLIN HFA) 108 (90 Base) MCG/ACT inhaler Inhale 2 puffs into the lungs every 6 (six) hours as needed for wheezing or shortness of breath.   ezetimibe (ZETIA) 10 MG tablet TAKE 1 TABLET EVERY DAY   omeprazole (PRILOSEC) 20 MG capsule Take 1 capsule (20 mg total) by mouth daily.   REPATHA SURECLICK 140 MG/ML SOAJ INJECT 1 DOSE SUBCUTANEOUSLY EVERY 14 DAYS   tamsulosin (FLOMAX) 0.4 MG CAPS capsule TAKE  1 CAPSULE EVERY DAY   valsartan (DIOVAN) 80 MG tablet TAKE 1 TABLET EVERY DAY   No facility-administered encounter medications on file as of 10/10/2022.    Review of Systems:  Review of Systems  Constitutional:  Positive for chills. Negative for activity change, appetite change and fever.  HENT:  Negative for sore throat.   Eyes: Negative.   Respiratory:  Positive for cough, shortness of breath and wheezing.        Yellowish phlegm  Cardiovascular:  Negative for chest pain and leg swelling.  Gastrointestinal:  Negative for abdominal distention, diarrhea and vomiting.   Genitourinary:  Negative for dysuria, frequency and urgency.  Skin:  Negative for color change.  Neurological:  Negative for dizziness and headaches.  Psychiatric/Behavioral:  Negative for behavioral problems and sleep disturbance. The patient is not nervous/anxious.     Health Maintenance  Topic Date Due   Zoster Vaccines- Shingrix (1 of 2) Never done   Medicare Annual Wellness (AWV)  11/11/2022   INFLUENZA VACCINE  01/05/2023   DTaP/Tdap/Td (4 - Td or Tdap) 01/23/2032   Pneumonia Vaccine 24+ Years old  Completed   HPV VACCINES  Aged Out   COVID-19 Vaccine  Discontinued    Physical Exam: Vitals:   10/10/22 0904  BP: 127/88  Pulse: 77  Resp: 18  Temp: 97.6 F (36.4 C)  SpO2: 96%  Weight: 123 lb (55.8 kg)  Height: 5\' 7"  (1.702 m)   Body mass index is 19.26 kg/m. Physical Exam Constitutional:      Appearance: Normal appearance.  HENT:     Head: Normocephalic and atraumatic.     Ears:     Comments: Hard of hearing    Mouth/Throat:     Mouth: Mucous membranes are moist.  Eyes:     Conjunctiva/sclera: Conjunctivae normal.  Cardiovascular:     Rate and Rhythm: Normal rate and regular rhythm.     Pulses: Normal pulses.     Heart sounds: Normal heart sounds.  Pulmonary:     Effort: Pulmonary effort is normal.     Breath sounds: Normal breath sounds.  Abdominal:     General: Bowel sounds are normal.     Palpations: Abdomen is soft.  Musculoskeletal:        General: No swelling. Normal range of motion.     Cervical back: Normal range of motion.  Skin:    General: Skin is warm and dry.  Neurological:     General: No focal deficit present.     Mental Status: He is alert and oriented to person, place, and time.     Comments: Left side facial droop, "born like that" per wife  Psychiatric:        Mood and Affect: Mood normal.        Behavior: Behavior normal.        Thought Content: Thought content normal.        Judgment: Judgment normal.     Labs  reviewed: Basic Metabolic Panel: Recent Labs    12/31/21 0951 07/18/22 0842 07/25/22 1438  NA 135 136 137  K 5.2 4.5 3.3*  CL 100 101 100  CO2 27 28 27   GLUCOSE 88 86 91  BUN 14 14 19   CREATININE 1.58* 1.44* 1.23  CALCIUM 9.5 8.8 8.1*  TSH 2.69 2.90  --    Liver Function Tests: Recent Labs    12/31/21 0951 07/18/22 0842  AST 18 18  ALT 10 6*  BILITOT 0.7 0.7  PROT 7.0 6.5   No results for input(s): "LIPASE", "AMYLASE" in the last 8760 hours. No results for input(s): "AMMONIA" in the last 8760 hours. CBC: Recent Labs    12/31/21 0951 07/18/22 0842 07/25/22 1438  WBC 5.3 8.5 9.9  NEUTROABS 3,514 7,081  --   HGB 14.9 14.3 13.2  HCT 43.3 41.9 39.7  MCV 88.0 89.7 90.4  PLT 221 203 239   Lipid Panel: Recent Labs    12/31/21 0951 07/18/22 0842  CHOL 157 142  HDL 65 61  LDLCALC 75 65  TRIG 86 79  CHOLHDL 2.4 2.3   Lab Results  Component Value Date   HGBA1C 6.0 (H) 07/18/2022    Procedures since last visit: No results found.  Assessment/Plan  1. Acute bronchitis, unspecified organism -  occasional SOB, wheezing and chills - doxycycline (VIBRA-TABS) 100 MG tablet; Take 1 tablet (100 mg total) by mouth 2 (two) times daily for 7 days.  Dispense: 14 tablet; Refill: 0 - albuterol (VENTOLIN HFA) 108 (90 Base) MCG/ACT inhaler; Inhale 2 puffs into the lungs every 6 (six) hours as needed for wheezing or shortness of breath.  Dispense: 8 g; Refill: 0  2. COPD GOLD II  -  O2 sat 96% on room air - albuterol (VENTOLIN HFA) 108 (90 Base) MCG/ACT inhaler; Inhale 2 puffs into the lungs every 6 (six) hours as needed for wheezing or shortness of breath.  Dispense: 8 g; Refill: 0    Labs/tests ordered:  None  Next appt:  11/14/2022

## 2022-10-10 NOTE — Patient Outreach (Signed)
**Note Thomas-Identified via Obfuscation**   Care Coordination   Follow Up Visit Note   10/10/2022 Name: Thomas Reyes MRN: [id number] DOB: 1941-01-04  Thomas Reyes is a 82 y.o. year old male who sees Ngetich, [name] C, NP for primary care. I spoke with  Thomas Reyes by phone today.  What matters to the patients health and wellness today?  Patient would like to recover from bronchitis without complications.     Goals Addressed             This Visit's Progress    To use Albuterol inhaler as directed for shortness of breath       Care Coordination Interventions: Completed successful outbound call with wife Darel Hong  Determined patient completed a PCP follow up today for evaluation of ongoing productive cough and chills Review of patient status, including review of consultant's reports, relevant laboratory and other test results, and medications completed, educated on importance of using sun screen while outdoors to help prevent enhanced sunburn due to increased sensitivity to the sun while taking Doxycycline  Educated wife on basic disease process related to acute and or chronic bronchitis associated with COPD Educated wife on the importance of having patient use his inhaler exactly as prescribed and complete his full course of antibiotics  Discussed with wife the benefits of patient getting re-established with his Pulmonologist for ongoing management of COPD  Instructed wife to report new or worsening symptoms to his doctor promptly     Interventions Today    Flowsheet Row Most Recent Value  Chronic Disease   Chronic disease during today's visit Chronic Obstructive Pulmonary Disease (COPD)  General Interventions   General Interventions Discussed/Reviewed General Interventions Discussed, General Interventions Reviewed, Doctor Visits  Doctor Visits Discussed/Reviewed Doctor Visits Discussed, Doctor Visits Reviewed, PCP  Education Interventions   Education Provided Provided Education  Provided Verbal Education On  Medication, When to see the doctor, Other  [skin cancer prevention]  Pharmacy Interventions   Pharmacy Dicussed/Reviewed Medications and their functions, Medication Adherence, Pharmacy Topics Reviewed, Pharmacy Topics Discussed           SDOH assessments and interventions completed:  No     Care Coordination Interventions:  Yes, provided   Follow up plan: Follow up call scheduled for 11/16/22 @09 :30 AM    Encounter Outcome:  Pt. Visit Completed

## 2022-10-10 NOTE — Patient Instructions (Signed)
Visit Information  Thank you for taking time to visit with me today. Please don't hesitate to contact me if I can be of assistance to you.   Following are the goals we discussed today:   Goals Addressed             This Visit's Progress    To use Albuterol inhaler as directed for shortness of breath       Care Coordination Interventions: Completed successful outbound call with wife Darel Hong  Determined patient completed a PCP follow up today for evaluation of ongoing productive cough and chills Review of patient status, including review of consultant's reports, relevant laboratory and other test results, and medications completed, educated on importance of using sun screen while outdoors to help prevent enhanced sunburn due to increased sensitivity to the sun while taking Doxycycline  Educated wife on basic disease process related to acute and or chronic bronchitis associated with COPD Educated wife on the importance of having patient use his inhaler exactly as prescribed and complete his full course of antibiotics  Discussed with wife the benefits of patient getting re-established with his Pulmonologist for ongoing management of COPD  Instructed wife to report new or worsening symptoms to his doctor promptly         Our next appointment is by telephone on 11/16/22 at 09:30 AM  Please call the care guide team at [phone] if you need to cancel or reschedule your appointment.   If you are experiencing a Mental Health or Behavioral Health Crisis or need someone to talk to, please call 1-800-273-TALK (toll free, 24 hour hotline) go to [facility] [address], [place] [phone])  The patient verbalized understanding of instructions, educational materials, and care plan provided today and DECLINED offer to receive copy of patient instructions, educational materials, and care plan.   Delsa Sale, RN, BSN, CCM Care Management Coordinator [facility]  Care Management  Direct Phone: [phone]

## 2022-10-14 ENCOUNTER — Telehealth: Payer: Self-pay | Admitting: *Deleted

## 2022-10-14 NOTE — Progress Notes (Signed)
**Note Thomas-Identified via Obfuscation**   Care Coordination Note  10/14/2022 Name: Thomas Reyes MRN: [id number] DOB: [birth year]  Thomas Reyes is a 82 y.o. year old male who is a primary care patient of Ngetich, [name] C, NP and is actively engaged with the care management team. I reached out to Thomas Reyes by phone today to assist with re-scheduling a follow up visit with the RN Case Manager  Follow up plan: Unsuccessful telephone outreach attempt made.   [facility]  Care Coordination Care Guide  Direct Dial: [phone]

## 2022-10-17 NOTE — Progress Notes (Signed)
**Note Thomas-Identified via Obfuscation**   Care Coordination Note  10/17/2022 Name: Thomas Reyes MRN: [id number] DOB: [birth year]  Thomas Reyes is a 82 y.o. year old male who is a primary care patient of Ngetich, [name] C, NP and is actively engaged with the care management team. I reached out to Thomas Reyes by phone today to assist with re-scheduling a follow up visit with the RN Case Manager  Follow up plan: Unsuccessful telephone outreach attempt made. A HIPAA compliant phone message was left for the patient providing contact information and requesting a return call.   [facility]  Care Coordination Care Guide  Direct Dial: [phone]

## 2022-10-21 NOTE — Progress Notes (Signed)
**Note Thomas-Identified via Obfuscation**   Care Coordination Note  10/21/2022 Name: Thomas Reyes MRN: [id number] DOB: 23-May-1941  Thomas Reyes is a 82 y.o. year old male who is a primary care patient of Ngetich, [name] C, NP and is actively engaged with the care management team. I reached out to Thomas Reyes by phone today to assist with re-scheduling a follow up visit with the RN Case Manager  Follow up plan: Unsuccessful telephone outreach attempt made. A HIPAA compliant phone message was left for the patient providing contact information and requesting a return call.  We have been unable to make contact with the patient for follow up. The care management team is available to follow up with the patient after provider conversation with the patient regarding recommendation for care management engagement and subsequent re-referral to the care management team.   [facility] Coordination Care Guide  Direct Dial: [phone]

## 2022-11-14 ENCOUNTER — Encounter: Payer: Medicare HMO | Admitting: Family

## 2022-11-16 ENCOUNTER — Other Ambulatory Visit: Payer: Self-pay | Admitting: Family

## 2022-11-16 DIAGNOSIS — I10 Essential (primary) hypertension: Secondary | ICD-10-CM

## 2022-12-27 DIAGNOSIS — Z961 Presence of intraocular lens: Secondary | ICD-10-CM | POA: Diagnosis not present

## 2022-12-27 DIAGNOSIS — H04123 Dry eye syndrome of bilateral lacrimal glands: Secondary | ICD-10-CM | POA: Diagnosis not present

## 2022-12-27 DIAGNOSIS — H10413 Chronic giant papillary conjunctivitis, bilateral: Secondary | ICD-10-CM | POA: Diagnosis not present

## 2022-12-27 DIAGNOSIS — H43811 Vitreous degeneration, right eye: Secondary | ICD-10-CM | POA: Diagnosis not present

## 2022-12-27 DIAGNOSIS — H353131 Nonexudative age-related macular degeneration, bilateral, early dry stage: Secondary | ICD-10-CM | POA: Diagnosis not present

## 2022-12-27 DIAGNOSIS — H11042 Peripheral pterygium, stationary, left eye: Secondary | ICD-10-CM | POA: Diagnosis not present

## 2023-01-02 ENCOUNTER — Ambulatory Visit (INDEPENDENT_AMBULATORY_CARE_PROVIDER_SITE_OTHER): Payer: Medicare HMO | Admitting: Family

## 2023-01-02 ENCOUNTER — Encounter: Payer: Self-pay | Admitting: Family

## 2023-01-02 VITALS — BP 130/68 | HR 65 | Temp 97.3°F | Resp 18 | Ht 67.0 in | Wt 126.0 lb

## 2023-01-02 DIAGNOSIS — E7800 Pure hypercholesterolemia, unspecified: Secondary | ICD-10-CM

## 2023-01-02 DIAGNOSIS — N1831 Chronic kidney disease, stage 3a: Secondary | ICD-10-CM

## 2023-01-02 DIAGNOSIS — K219 Gastro-esophageal reflux disease without esophagitis: Secondary | ICD-10-CM | POA: Diagnosis not present

## 2023-01-02 DIAGNOSIS — I10 Essential (primary) hypertension: Secondary | ICD-10-CM | POA: Diagnosis not present

## 2023-01-02 DIAGNOSIS — R7303 Prediabetes: Secondary | ICD-10-CM

## 2023-01-02 NOTE — Progress Notes (Signed)
Provider: Richarda Blade FNP-C   Alydia Gosser, Donalee Citrin, NP  Patient Care Team: Ahmya Bernick, Donalee Citrin, NP as PCP - General (Family Medicine) Sallye Lat, MD as Consulting Physician (Ophthalmology) Clarene Duke Karma Lew, RN as [facility] Care Management  Extended Emergency Contact Information Primary Emergency Contact: [facility] Address: [address]          [place], [place] [address] [place] Home Phone: [phone] Relation: Spouse  Code Status:  Full Code  Goals of care: Advanced Directive information    07/29/2022   10:03 AM  Advanced Directives  Does Patient Have a Medical Advance Directive? Yes  Type of Advance Directive Living will;Healthcare Power of Attorney  Copy of Healthcare Power of Attorney in Chart? No - copy requested     Chief Complaint  Patient presents with   Medical Management of Chronic Issues    Patient is here for a follow up for chronic conditions     HPI:  Pt is a 82 y.o. male seen today for 6 months for follow up  medical management of chronic diseases.He is here with wife.states lost his brother to liver cirrhosis cancer from alcohol.    COPD - has not required  inhalers.states coughs every now and then.gets short of breath when rolling the trash.  Macular degeneration - states has not changed.Follows up with Dr.Groat   Hypertension -  no home B/p for review .He denies any headache,dizziness,vision changes,fatigue,chest tightness,palpitation,chest pain or shortness of breath.  Has not been taking his ASA states due to bruising easily.     Due for shingles vaccine.aware to get at the pharmacy.    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   Chest pain, unspecified 08/20/2012   Chest pain, unspecified 12/26/2011   Concussion, unspecified 10/30/1994   COPD (chronic obstructive pulmonary disease) (HCC) 02/24/2006   Corns  and callosities 04/11/2007   Dermatophytosis of the body    Disturbance of skin sensation    Diverticulosis of colon (without mention of hemorrhage) 06/14/2010   Edema of male genital organs 08/20/2012   Elevated blood pressure reading without diagnosis of hypertension 06/14/2010   Encounter for long-term (current) use of other medications    Hypercholesterolemia    Hypertrophy of prostate without urinary obstruction and other lower urinary tract symptoms (LUTS)    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   Lumbago 05/11/2009   Nocturia 08/20/2012   Other and unspecified hyperlipidemia 02/24/2006   Pain in joint, ankle and foot 04/11/2007   Pterygium, unspecified 03/01/2006   Rash and other nonspecific skin eruption 12/28/1999   Seborrheic dermatitis, unspecified 03/21/2001   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   Tobacco use disorder    Ulcer of other part of foot 03/16/2007   Unspecified constipation 05/11/2009   Urinary tract infection, site not specified 05/11/2009   Ventral hernia, unspecified, without mention of obstruction or gangrene    Past Surgical History:  Procedure Laterality Date   ELBOW SURGERY Right    EYE SURGERY     Cataract Removal   FRACTURE SURGERY  1986   right hip, right elbow   KNEE SURGERY Right 01/2007   knee cap injury   SKIN CANCER EXCISION  07/2014   Back and head    TONSILLECTOMY  1948    Allergies  Allergen Reactions   Zocor [Simvastatin] Shortness Of Breath  Sob and itching   Ace Inhibitors Cough    Tolerating ARB instead   Lipitor [Atorvastatin]     Muscle and bone pain   Pravachol [Pravastatin Sodium] Other (See Comments)    myalgias    Allergies as of 01/02/2023       Reactions   Zocor [simvastatin] Shortness Of Breath   Sob and itching   Ace Inhibitors Cough   Tolerating ARB instead   Lipitor [atorvastatin]    Muscle and bone pain   Pravachol [pravastatin Sodium] Other (See  Comments)   myalgias        Medication List        Accurate as of January 02, 2023  9:32 AM. If you have any questions, ask your nurse or doctor.          albuterol 108 (90 Base) MCG/ACT inhaler Commonly known as: VENTOLIN HFA Inhale 2 puffs into the lungs every 6 (six) hours as needed for wheezing or shortness of breath.   ezetimibe 10 MG tablet Commonly known as: ZETIA TAKE 1 TABLET EVERY DAY   omeprazole 20 MG capsule Commonly known as: PRILOSEC Take 1 capsule (20 mg total) by mouth daily.   Repatha SureClick 140 MG/ML Soaj Generic drug: Evolocumab INJECT 1 DOSE SUBCUTANEOUSLY EVERY 14 DAYS   tamsulosin 0.4 MG Caps capsule Commonly known as: FLOMAX TAKE 1 CAPSULE EVERY DAY   valsartan 80 MG tablet Commonly known as: DIOVAN TAKE 1 TABLET EVERY DAY        Review of Systems  Constitutional:  Negative for appetite change, chills, fatigue, fever and unexpected weight change.  HENT:  Negative for congestion, dental problem, ear discharge, ear pain, facial swelling, hearing loss, nosebleeds, postnasal drip, rhinorrhea, sinus pressure, sinus pain, sneezing, sore throat, tinnitus and trouble swallowing.   Eyes:  Negative for pain, discharge, redness, itching and visual disturbance.  Respiratory:  Negative for cough, chest tightness, shortness of breath and wheezing.   Cardiovascular:  Negative for chest pain, palpitations and leg swelling.  Gastrointestinal:  Negative for abdominal distention, abdominal pain, blood in stool, constipation, diarrhea, nausea and vomiting.  Endocrine: Negative for cold intolerance, heat intolerance, polydipsia, polyphagia and polyuria.  Genitourinary:  Negative for difficulty urinating, dysuria, flank pain, frequency and urgency.  Musculoskeletal:  Negative for arthralgias, back pain, gait problem, joint swelling, myalgias, neck pain and neck stiffness.  Skin:  Negative for color change, pallor, rash and wound.  Neurological:  Negative for  dizziness, syncope, speech difficulty, weakness, light-headedness, numbness and headaches.  Hematological:  Does not bruise/bleed easily.  Psychiatric/Behavioral:  Negative for agitation, behavioral problems, confusion, hallucinations and sleep disturbance. The patient is not nervous/anxious.     Immunization History  Administered Date(s) Administered   Fluad Quad(high Dose 65+) 05/27/2019, 03/05/2020, 07/02/2021, 07/04/2022   Influenza, High Dose Seasonal PF 03/09/2017, 05/21/2018   Influenza,inj,Quad PF,6+ Mos 03/31/2014, 04/03/2015, 02/25/2016   PFIZER(Purple Top)SARS-COV-2 Vaccination 07/21/2019, 11/06/2019   Pneumococcal Conjugate-13 07/18/2013   Pneumococcal Polysaccharide-23 10/02/2014, 12/31/2021   Tdap 06/10/2005, 12/03/2013, 01/22/2022   Pertinent  Health Maintenance Due  Topic Date Due   INFLUENZA VACCINE  01/05/2023      12/31/2021    9:03 AM 07/04/2022    8:56 AM 07/29/2022   10:02 AM 10/10/2022    8:57 AM 01/02/2023    8:43 AM  Fall Risk  Falls in the past year? 0 0 0 0 0  Was there an injury with Fall? 0 0 0 0 0  Fall Risk Category Calculator 0  0 0 0 0  Fall Risk Category (Retired) Low      (RETIRED) Patient Fall Risk Level Low fall risk      Patient at Risk for Falls Due to No Fall Risks No Fall Risks No Fall Risks No Fall Risks No Fall Risks  Fall risk Follow up Falls evaluation completed Falls evaluation completed Falls evaluation completed Falls evaluation completed Falls evaluation completed   Functional Status Survey:    Vitals:   01/02/23 0843  BP: 130/68  Pulse: 65  Resp: 18  Temp: (!) 97.3 F (36.3 C)  Weight: 126 lb (57.2 kg)  Height: 5\' 7"  (1.702 m)   Body mass index is 19.73 kg/m. Physical Exam Vitals reviewed.  Constitutional:      General: He is not in acute distress.    Appearance: Normal appearance. He is normal weight. He is not ill-appearing or diaphoretic.  HENT:     Head: Normocephalic.     Right Ear: Tympanic membrane, ear canal  and external ear normal. There is no impacted cerumen.     Left Ear: Tympanic membrane, ear canal and external ear normal. There is no impacted cerumen.     Nose: Nose normal. No congestion or rhinorrhea.     Mouth/Throat:     Mouth: Mucous membranes are moist.     Pharynx: Oropharynx is clear. No oropharyngeal exudate or posterior oropharyngeal erythema.     Comments: Will be getting dentures in one week  Eyes:     General: No scleral icterus.       Right eye: No discharge.        Left eye: No discharge.     Extraocular Movements: Extraocular movements intact.     Conjunctiva/sclera: Conjunctivae normal.     Pupils: Pupils are equal, round, and reactive to light.  Neck:     Vascular: No carotid bruit.  Cardiovascular:     Rate and Rhythm: Normal rate and regular rhythm.     Pulses: Normal pulses.     Heart sounds: Normal heart sounds. No murmur heard.    No friction rub. No gallop.  Pulmonary:     Effort: Pulmonary effort is normal. No respiratory distress.     Breath sounds: Normal breath sounds. No wheezing, rhonchi or rales.  Chest:     Chest wall: No tenderness.  Abdominal:     General: Bowel sounds are normal. There is no distension.     Palpations: Abdomen is soft. There is no mass.     Tenderness: There is no abdominal tenderness. There is no right CVA tenderness, left CVA tenderness, guarding or rebound.  Musculoskeletal:        General: No swelling or tenderness. Normal range of motion.     Cervical back: Normal range of motion. No rigidity or tenderness.     Right lower leg: No edema.     Left lower leg: No edema.  Lymphadenopathy:     Cervical: No cervical adenopathy.  Skin:    General: Skin is warm and dry.     Coloration: Skin is not pale.     Findings: No bruising, erythema, lesion or rash.  Neurological:     Mental Status: He is alert and oriented to person, place, and time.     Cranial Nerves: No cranial nerve deficit.     Sensory: No sensory deficit.      Motor: No weakness.     Coordination: Coordination normal.     Gait: Gait normal.  Psychiatric:        Mood and Affect: Mood normal.        Speech: Speech normal.        Behavior: Behavior normal.        Thought Content: Thought content normal.        Judgment: Judgment normal.    Labs reviewed: Recent Labs    07/18/22 0842 07/25/22 1438  NA 136 137  K 4.5 3.3*  CL 101 100  CO2 28 27  GLUCOSE 86 91  BUN 14 19  CREATININE 1.44* 1.23  CALCIUM 8.8 8.1*   Recent Labs    07/18/22 0842  AST 18  ALT 6*  BILITOT 0.7  PROT 6.5   Recent Labs    07/18/22 0842 07/25/22 1438  WBC 8.5 9.9  NEUTROABS 7,081  --   HGB 14.3 13.2  HCT 41.9 39.7  MCV 89.7 90.4  PLT 203 239   Lab Results  Component Value Date   TSH 2.90 07/18/2022   Lab Results  Component Value Date   HGBA1C 6.0 (H) 07/18/2022   Lab Results  Component Value Date   CHOL 142 07/18/2022   HDL 61 07/18/2022   LDLCALC 65 07/18/2022   TRIG 79 07/18/2022   CHOLHDL 2.3 07/18/2022    Significant Diagnostic Results in last 30 days:  No results found.  Assessment/Plan  1. Essential hypertension Blood pressure well-controlled -Continue on valsartan -Continue on dietary modification and exercise as tolerated - COMPLETE METABOLIC PANEL WITH GFR - CBC with Differential/Platelet  2. Prediabetes Lab Results  Component Value Date   HGBA1C 6.0 (H) 07/18/2022  -Continue dietary modification and exercise as above - TSH - COMPLETE METABOLIC PANEL WITH GFR - CBC with Differential/Platelet - Hemoglobin A1c  3. Gastroesophageal reflux disease without esophagitis Symptoms controlled. H/H stable.No tarry or black stool  - advised to avoid eating meals late in the evening and to avoid aggravating foods and spices. - continue on Omeprazole   4. Stage 3a chronic kidney disease (HCC) CR 1.23 improved from 1.4  Will continue to avoid Nephrotoxins and dose all other medication for renal clearance  - COMPLETE  METABOLIC PANEL WITH GFR  5. Hypercholesterolemia Continue Zetia and Repatha - Lipid panel  Family/ staff Communication: Reviewed plan of care with patient verbalized understanding  Labs/tests ordered:  - TSH - COMPLETE METABOLIC PANEL WITH GFR - CBC with Differential/Platelet - Hemoglobin A1c - Lipid panel  Next Appointment : Return in about 6 months (around 07/05/2023) for medical mangement of chronic issues.Caesar Bookman, NP

## 2023-01-05 ENCOUNTER — Ambulatory Visit: Payer: Self-pay

## 2023-01-05 NOTE — Patient Outreach (Signed)
**Note Thomas-Identified via Obfuscation**   Care Coordination   Follow Up Visit Note   01/05/2023 Name: Thomas Reyes MRN: [id number] DOB: 03/14/1941  Thomas Reyes is a 82 y.o. year old male who sees Ngetich, [name] C, NP for primary care. I spoke with  Thomas Reyes by phone today.  What matters to the patients health and wellness today?  Patient would like to get fitted for dentures, he has an upcoming appointment.    Goals Addressed             This Visit's Progress    To monitor BP at home more often       Care Coordination Interventions: Completed successful outbound call with wife Thomas Reyes  Evaluation of current treatment plan related to hypertension self management and patient's adherence to plan as established by provider Review of patient status, including review of consultant's reports, relevant laboratory and other test results, and medications completed Advised patient, providing education and rationale, to monitor blood pressure daily and record, calling PCP for findings outside established parameters Last practice recorded BP readings:  BP Readings from Last 3 Encounters:  01/02/23 130/68  10/10/22 127/88  09/05/22 (!) 164/80   Most recent eGFR/CrCl:  Lab Results  Component Value Date   EGFR 58 (L) 01/02/2023    No components found for: "CRCL"     COMPLETED: To use Albuterol inhaler as directed for shortness of breath       Care Coordination Interventions: Evaluation of current treatment plan related to COPD and patient's adherence to plan as established by provider Advised patient to track and manage COPD triggers Provided instruction about proper use of medications used for management of COPD including inhalers Discussed the importance of adequate rest and management of fatigue with COPD    Interventions Today    Flowsheet Row Most Recent Value  Chronic Disease   Chronic disease during today's visit Chronic Obstructive Pulmonary Disease (COPD), Diabetes, Hypertension (HTN)  General  Interventions   General Interventions Discussed/Reviewed General Interventions Discussed, General Interventions Reviewed, Labs, Doctor Visits  Doctor Visits Discussed/Reviewed Doctor Visits Reviewed, Doctor Visits Discussed, PCP  Education Interventions   Education Provided Provided Education  Provided Verbal Education On When to see the doctor, Labs, Medication  Nutrition Interventions   Nutrition Discussed/Reviewed Nutrition Discussed, Nutrition Reviewed          SDOH assessments and interventions completed:  No     Care Coordination Interventions:  Yes, provided   Follow up plan: Follow up call scheduled for 04/10/23 @10 :30 AM    Encounter Outcome:  Pt. Visit Completed

## 2023-01-05 NOTE — Patient Instructions (Signed)
Visit Information  Thank you for taking time to visit with me today. Please don't hesitate to contact me if I can be of assistance to you.   Following are the goals we discussed today:   Goals Addressed             This Visit's Progress    To monitor BP at home more often       Care Coordination Interventions: Completed successful outbound call with wife Darel Hong  Evaluation of current treatment plan related to hypertension self management and patient's adherence to plan as established by provider Review of patient status, including review of consultant's reports, relevant laboratory and other test results, and medications completed Advised patient, providing education and rationale, to monitor blood pressure daily and record, calling PCP for findings outside established parameters Last practice recorded BP readings:  BP Readings from Last 3 Encounters:  01/02/23 130/68  10/10/22 127/88  09/05/22 (!) 164/80   Most recent eGFR/CrCl:  Lab Results  Component Value Date   EGFR 58 (L) 01/02/2023    No components found for: "CRCL"     COMPLETED: To use Albuterol inhaler as directed for shortness of breath       Care Coordination Interventions: Evaluation of current treatment plan related to COPD and patient's adherence to plan as established by provider Advised patient to track and manage COPD triggers Provided instruction about proper use of medications used for management of COPD including inhalers Discussed the importance of adequate rest and management of fatigue with COPD        Our next appointment is by telephone on 04/10/23 at 10:30 AM  Please call the care guide team at [phone] if you need to cancel or reschedule your appointment.   If you are experiencing a Mental Health or Behavioral Health Crisis or need someone to talk to, please call 1-800-273-TALK (toll free, 24 hour hotline)  The patient verbalized understanding of instructions, educational materials, and care  plan provided today and DECLINED offer to receive copy of patient instructions, educational materials, and care plan.   Delsa Sale, RN, BSN, CCM Care Management Coordinator [facility] Care Management Direct Phone: [phone]

## 2023-01-05 NOTE — Patient Outreach (Signed)
  Care Coordination   01/05/2023 Name: Thomas Reyes MRN: [id number] DOB: 02-02-1941   Care Coordination Outreach Attempts:  An unsuccessful telephone outreach was attempted for a scheduled appointment today.  Follow Up Plan:  Additional outreach attempts will be made to offer the patient care coordination information and services.   Encounter Outcome:  No Answer   Care Coordination Interventions:  No, not indicated    Delsa Sale, RN, BSN, CCM Care Management Coordinator [facility] Care Management Direct Phone: [phone]

## 2023-03-15 ENCOUNTER — Encounter: Payer: Self-pay | Admitting: Family

## 2023-03-15 ENCOUNTER — Ambulatory Visit (INDEPENDENT_AMBULATORY_CARE_PROVIDER_SITE_OTHER): Payer: Medicare HMO | Admitting: Family

## 2023-03-15 VITALS — BP 124/78 | HR 64 | Temp 97.3°F | Resp 18 | Ht 67.0 in | Wt 131.0 lb

## 2023-03-15 DIAGNOSIS — L989 Disorder of the skin and subcutaneous tissue, unspecified: Secondary | ICD-10-CM

## 2023-03-15 NOTE — Progress Notes (Signed)
Provider: Richarda Blade FNP-C  Thomas Reyes, Donalee Citrin, NP  Patient Care Team: Shawnay Bramel, Donalee Citrin, NP as PCP - General (Family Medicine) Sallye Lat, MD as Consulting Physician (Ophthalmology) Clarene Duke Karma Lew, RN as [facility] Care Management  Extended Emergency Contact Information Primary Emergency Contact: [facility] Address: [address]          [place], [place] [address] [place] Home Phone: [phone] Relation: Spouse  Code Status: Full Code  Goals of care: Advanced Directive information    07/29/2022   10:03 AM  Advanced Directives  Does Patient Have a Medical Advance Directive? Yes  Type of Advance Directive Living will;Healthcare Power of Attorney  Copy of Healthcare Power of Attorney in Chart? No - copy requested     Chief Complaint  Patient presents with   Acute Visit     left side mole started getting bigger and getting itchy above his waist on left side    HPI:  Pt is a 82 y.o. male seen today for an acute visit for evaluation of left lower abdomen mole.states seems to have changed color.would like it checked out.states went to dermatologist in the past and was told nothing to worry about.Has had previous skin cancer that were biopsied and frozen.    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   Chest pain, unspecified 08/20/2012   Chest pain, unspecified 12/26/2011   Concussion, unspecified 10/30/1994   COPD (chronic obstructive pulmonary disease) (HCC) 02/24/2006   Corns and callosities 04/11/2007   Dermatophytosis of the body    Disturbance of skin sensation    Diverticulosis of colon (without mention of hemorrhage) 06/14/2010   Edema of male genital organs 08/20/2012   Elevated blood pressure reading without diagnosis of hypertension 06/14/2010   Encounter for long-term (current) use of other medications     Hypercholesterolemia    Hypertrophy of prostate without urinary obstruction and other lower urinary tract symptoms (LUTS)    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   Lumbago 05/11/2009   Nocturia 08/20/2012   Other and unspecified hyperlipidemia 02/24/2006   Pain in joint, ankle and foot 04/11/2007   Pterygium, unspecified 03/01/2006   Rash and other nonspecific skin eruption 12/28/1999   Seborrheic dermatitis, unspecified 03/21/2001   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   Tobacco use disorder    Ulcer of other part of foot 03/16/2007   Unspecified constipation 05/11/2009   Urinary tract infection, site not specified 05/11/2009   Ventral hernia, unspecified, without mention of obstruction or gangrene    Past Surgical History:  Procedure Laterality Date   ELBOW SURGERY Right    EYE SURGERY     Cataract Removal   FRACTURE SURGERY  1986   right hip, right elbow   KNEE SURGERY Right 01/2007   knee cap injury   SKIN CANCER EXCISION  07/2014   Back and head    TONSILLECTOMY  1948    Allergies  Allergen Reactions   Zocor [Simvastatin] Shortness Of Breath    Sob and itching   Ace Inhibitors Cough    Tolerating ARB instead   Lipitor [Atorvastatin]     Muscle and bone pain   Pravachol [Pravastatin Sodium] Other (See Comments)    myalgias    Outpatient Encounter Medications as of 03/15/2023  Medication Sig   albuterol (VENTOLIN HFA) 108 (90 Base) MCG/ACT inhaler Inhale  2 puffs into the lungs every 6 (six) hours as needed for wheezing or shortness of breath.   ezetimibe (ZETIA) 10 MG tablet TAKE 1 TABLET EVERY DAY   omeprazole (PRILOSEC) 20 MG capsule Take 1 capsule (20 mg total) by mouth daily.   REPATHA SURECLICK 140 MG/ML SOAJ INJECT 1 DOSE SUBCUTANEOUSLY EVERY 14 DAYS   tamsulosin (FLOMAX) 0.4 MG CAPS capsule TAKE 1 CAPSULE EVERY DAY   valsartan (DIOVAN) 80 MG tablet TAKE 1 TABLET EVERY DAY   No facility-administered encounter  medications on file as of 03/15/2023.    Review of Systems  Constitutional:  Negative for appetite change, chills, fatigue, fever and unexpected weight change.  Eyes:  Negative for pain, discharge, redness, itching and visual disturbance.  Respiratory:  Negative for cough, chest tightness, shortness of breath and wheezing.   Cardiovascular:  Negative for chest pain, palpitations and leg swelling.  Skin:  Negative for color change, pallor and rash.       Left abdomen wall skin lesion     Immunization History  Administered Date(s) Administered   Fluad Quad(high Dose 65+) 05/27/2019, 03/05/2020, 07/02/2021, 07/04/2022   Influenza, High Dose Seasonal PF 03/09/2017, 05/21/2018   Influenza,inj,Quad PF,6+ Mos 03/31/2014, 04/03/2015, 02/25/2016   PFIZER(Purple Top)SARS-COV-2 Vaccination 07/21/2019, 11/06/2019   Pneumococcal Conjugate-13 07/18/2013   Pneumococcal Polysaccharide-23 10/02/2014, 12/31/2021   Tdap 06/10/2005, 12/03/2013, 01/22/2022   Pertinent  Health Maintenance Due  Topic Date Due   INFLUENZA VACCINE  01/05/2023      07/04/2022    8:56 AM 07/29/2022   10:02 AM 10/10/2022    8:57 AM 01/02/2023    8:43 AM 03/15/2023    2:49 PM  Fall Risk  Falls in the past year? 0 0 0 0 0  Was there an injury with Fall? 0 0 0 0 0  Fall Risk Category Calculator 0 0 0 0 0  Patient at Risk for Falls Due to No Fall Risks No Fall Risks No Fall Risks No Fall Risks No Fall Risks  Fall risk Follow up Falls evaluation completed Falls evaluation completed Falls evaluation completed Falls evaluation completed Falls evaluation completed   Functional Status Survey:    Vitals:   03/15/23 1446  BP: 124/78  Pulse: 64  Resp: 18  Temp: (!) 97.3 F (36.3 C)  SpO2: 95%  Weight: 131 lb (59.4 kg)  Height: 5\' 7"  (1.702 m)   Body mass index is 20.52 kg/m. Physical Exam Vitals reviewed.  Constitutional:      General: He is not in acute distress.    Appearance: Normal appearance. He is normal weight.  He is not ill-appearing or diaphoretic.  HENT:     Head: Normocephalic.  Eyes:     General: No scleral icterus.       Right eye: No discharge.        Left eye: No discharge.     Conjunctiva/sclera: Conjunctivae normal.     Pupils: Pupils are equal, round, and reactive to light.  Cardiovascular:     Rate and Rhythm: Normal rate and regular rhythm.     Pulses: Normal pulses.     Heart sounds: Normal heart sounds. No murmur heard.    No friction rub. No gallop.  Pulmonary:     Effort: Pulmonary effort is normal. No respiratory distress.     Breath sounds: Normal breath sounds. No wheezing, rhonchi or rales.  Chest:     Chest wall: No tenderness.  Abdominal:     General: Bowel sounds are  normal. There is no distension.     Palpations: Abdomen is soft. There is no mass.     Tenderness: There is no abdominal tenderness. There is no right CVA tenderness, left CVA tenderness, guarding or rebound.  Skin:    General: Skin is warm and dry.     Coloration: Skin is not pale.     Findings: Lesion present. No bruising, erythema or rash.     Comments: Left abdominal wall raised, black-colored and rough texture skin lesion.  Nontender to palpation.Surrounding skin tissue without any erythema.  Neurological:     Mental Status: He is alert and oriented to person, place, and time.     Motor: No weakness.     Gait: Gait normal.  Psychiatric:        Mood and Affect: Mood normal.        Speech: Speech normal.        Behavior: Behavior normal.     Labs reviewed: Recent Labs    07/18/22 0842 07/25/22 1438 01/02/23 1008  NA 136 137 139  K 4.5 3.3* 4.6  CL 101 100 105  CO2 28 27 28   GLUCOSE 86 91 91  BUN 14 19 13   CREATININE 1.44* 1.23 1.24*  CALCIUM 8.8 8.1* 9.2   Recent Labs    07/18/22 0842 01/02/23 1008  AST 18 18  ALT 6* 9  BILITOT 0.7 0.6  PROT 6.5 6.6   Recent Labs    07/18/22 0842 07/25/22 1438 01/02/23 1008  WBC 8.5 9.9 5.6  NEUTROABS 7,081  --  3,702  HGB 14.3  13.2 13.8  HCT 41.9 39.7 42.2  MCV 89.7 90.4 90.2  PLT 203 239 216   Lab Results  Component Value Date   TSH 1.69 01/02/2023   Lab Results  Component Value Date   HGBA1C 6.1 (H) 01/02/2023   Lab Results  Component Value Date   CHOL 145 01/02/2023   HDL 68 01/02/2023   LDLCALC 61 01/02/2023   TRIG 82 01/02/2023   CHOLHDL 2.1 01/02/2023    Significant Diagnostic Results in last 30 days:  No results found.  Assessment/Plan  Skin lesion Afebrile  Left abdominal wall raised, black-colored and rough texture skin lesion.  Nontender to palpation.Surrounding skin tissue without any erythema. Requests referral to the dermatologist - Ambulatory referral to Dermatology  Family/ staff Communication: Reviewed plan of care with patient verbalized understanding  Labs/tests ordered: None   Next Appointment: Return if symptoms worsen or fail to improve.   Caesar Bookman, NP

## 2023-03-23 ENCOUNTER — Other Ambulatory Visit: Payer: Self-pay | Admitting: Family

## 2023-03-23 ENCOUNTER — Other Ambulatory Visit: Payer: Self-pay | Admitting: Adult Health

## 2023-03-23 DIAGNOSIS — J209 Acute bronchitis, unspecified: Secondary | ICD-10-CM

## 2023-03-23 DIAGNOSIS — J449 Chronic obstructive pulmonary disease, unspecified: Secondary | ICD-10-CM

## 2023-03-23 NOTE — Telephone Encounter (Signed)
Needs to Refills for the albuterol (VENTOLIN HFA) 108 (90 Base) MCG/ACT inhaler

## 2023-03-28 ENCOUNTER — Other Ambulatory Visit: Payer: Self-pay

## 2023-03-28 MED ORDER — TAMSULOSIN HCL 0.4 MG PO CAPS: 0.4000 mg | ORAL_CAPSULE | Freq: Every day | ORAL | 1 refills | Status: DC

## 2023-04-06 ENCOUNTER — Ambulatory Visit: Payer: Medicare HMO | Admitting: Internal Medicine

## 2023-04-10 ENCOUNTER — Ambulatory Visit: Payer: Self-pay

## 2023-04-10 NOTE — Patient Instructions (Signed)
Visit Information  Thank you for taking time to visit with me today. Please don't hesitate to contact me if I can be of assistance to you.   Following are the goals we discussed today:   Goals Addressed             This Visit's Progress    To have abnormal mole evaluated and treated       Care Coordination Interventions: Evaluation of current treatment plan related to change in appearance of mole to left side and patient's adherence to plan as established by provider Discussed with wife Darel Hong, PCP recommendations for Dermatology referral for evaluation of change in appearance of a mole on his left side  Reviewed and discussed with wife, patient's upcoming scheduled initial f/u with dermatology scheduled for 05/09/23  Discussed plans with patient for ongoing care coordination follow up and provided patient with direct contact information for nurse care coordinator     COMPLETED: To monitor BP at home more often       Care Coordination Interventions: Completed successful outbound call with wife Darel Hong  Evaluation of current treatment plan related to hypertension self management and patient's adherence to plan as established by provider Review of patient status, including review of consultant's reports, relevant laboratory and other test results, and medications completed Advised patient, providing education and rationale, to monitor blood pressure daily and record, calling PCP for findings outside established parameters Last practice recorded BP readings:  BP Readings from Last 3 Encounters:  03/15/23 124/78  01/02/23 130/68  10/10/22 127/88   Most recent eGFR/CrCl:  Lab Results  Component Value Date   EGFR 58 (L) 01/02/2023    No components found for: "CRCL"        Our next appointment is by telephone on 07/06/23 at 11:00 AM  Please call the care guide team at [phone] if you need to cancel or reschedule your appointment.   If you are experiencing a Mental Health or  Behavioral Health Crisis or need someone to talk to, please call 1-800-273-TALK (toll free, 24 hour hotline)  The patient verbalized understanding of instructions, educational materials, and care plan provided today and DECLINED offer to receive copy of patient instructions, educational materials, and care plan.   Delsa Sale RN BSN CCM [place]  [facility], [facility] Nurse Care Coordinator  Direct Dial: [phone] Website: [email or web address]

## 2023-04-10 NOTE — Patient Outreach (Signed)
**Note Thomas-Identified via Obfuscation**   Care Coordination   Follow Up Visit Note   04/10/2023 Name: Thomas Reyes MRN: [id number] DOB: [birth year]  Thomas Reyes is a 82 y.o. year old male who sees Ngetich, [name] C, NP for primary care. I spoke with wife Thomas Reyes by phone today.  What matters to the patients health and wellness today?  Patient would like to have an abnormal mole evaluated by Dermatology.     Goals Addressed             This Visit's Progress    To have abnormal mole evaluated and treated       Care Coordination Interventions: Evaluation of current treatment plan related to change in appearance of mole to left side and patient's adherence to plan as established by provider Discussed with wife Thomas Reyes, PCP recommendations for Dermatology referral for evaluation of change in appearance of a mole on his left side  Reviewed and discussed with wife, patient's upcoming scheduled initial f/u with dermatology scheduled for 05/09/23  Discussed plans with patient for ongoing care coordination follow up and provided patient with direct contact information for nurse care coordinator      COMPLETED: To monitor BP at home more often       Care Coordination Interventions: Completed successful outbound call with wife Thomas Reyes  Evaluation of current treatment plan related to hypertension self management and patient's adherence to plan as established by provider Review of patient status, including review of consultant's reports, relevant laboratory and other test results, and medications completed Advised patient, providing education and rationale, to monitor blood pressure daily and record, calling PCP for findings outside established parameters Last practice recorded BP readings:  BP Readings from Last 3 Encounters:  03/15/23 124/78  01/02/23 130/68  10/10/22 127/88   Most recent eGFR/CrCl:  Lab Results  Component Value Date   EGFR 58 (L) 01/02/2023    No components found for: "CRCL"    Interventions Today     Flowsheet Row Most Recent Value  Chronic Disease   Chronic disease during today's visit Hypertension (HTN), Other  [change in appearance of mole on left side]  General Interventions   General Interventions Discussed/Reviewed General Interventions Discussed, General Interventions Reviewed, Doctor Visits  Doctor Visits Discussed/Reviewed Doctor Visits Reviewed, Doctor Visits Discussed  Education Interventions   Education Provided Provided Education  Provided Verbal Education On When to see the doctor          SDOH assessments and interventions completed:  No     Care Coordination Interventions:  Yes, provided   Follow up plan: Follow up call scheduled for 07/06/23 @11 :00 AM    Encounter Outcome:  Patient Visit Completed

## 2023-05-07 HISTORY — PX: MOLE REMOVAL: SHX2046

## 2023-05-09 DIAGNOSIS — L821 Other seborrheic keratosis: Secondary | ICD-10-CM | POA: Diagnosis not present

## 2023-05-09 DIAGNOSIS — D492 Neoplasm of unspecified behavior of bone, soft tissue, and skin: Secondary | ICD-10-CM | POA: Diagnosis not present

## 2023-05-09 DIAGNOSIS — L820 Inflamed seborrheic keratosis: Secondary | ICD-10-CM | POA: Diagnosis not present

## 2023-05-09 DIAGNOSIS — L538 Other specified erythematous conditions: Secondary | ICD-10-CM | POA: Diagnosis not present

## 2023-06-14 ENCOUNTER — Other Ambulatory Visit: Payer: Self-pay | Admitting: Internal Medicine

## 2023-07-04 ENCOUNTER — Encounter: Payer: Self-pay | Admitting: Family

## 2023-07-04 ENCOUNTER — Ambulatory Visit (INDEPENDENT_AMBULATORY_CARE_PROVIDER_SITE_OTHER): Payer: Medicare HMO | Admitting: Family

## 2023-07-04 VITALS — BP 126/72 | HR 60 | Temp 97.8°F | Resp 19 | Ht 67.0 in | Wt 134.4 lb

## 2023-07-04 DIAGNOSIS — E7800 Pure hypercholesterolemia, unspecified: Secondary | ICD-10-CM | POA: Diagnosis not present

## 2023-07-04 DIAGNOSIS — J449 Chronic obstructive pulmonary disease, unspecified: Secondary | ICD-10-CM | POA: Diagnosis not present

## 2023-07-04 DIAGNOSIS — R7303 Prediabetes: Secondary | ICD-10-CM | POA: Diagnosis not present

## 2023-07-04 DIAGNOSIS — Z23 Encounter for immunization: Secondary | ICD-10-CM

## 2023-07-04 DIAGNOSIS — N1831 Chronic kidney disease, stage 3a: Secondary | ICD-10-CM

## 2023-07-04 DIAGNOSIS — N138 Other obstructive and reflux uropathy: Secondary | ICD-10-CM | POA: Diagnosis not present

## 2023-07-04 DIAGNOSIS — N401 Enlarged prostate with lower urinary tract symptoms: Secondary | ICD-10-CM | POA: Diagnosis not present

## 2023-07-04 DIAGNOSIS — K219 Gastro-esophageal reflux disease without esophagitis: Secondary | ICD-10-CM | POA: Diagnosis not present

## 2023-07-04 DIAGNOSIS — I10 Essential (primary) hypertension: Secondary | ICD-10-CM

## 2023-07-04 NOTE — Progress Notes (Signed)
Provider: Richarda Blade FNP-C   Adalis Gatti, Donalee Citrin, NP  Patient Care Team: Muhsin Doris, Donalee Citrin, NP as PCP - General (Family Medicine) Sallye Lat, MD as Consulting Physician (Ophthalmology) Clarene Duke Karma Lew, RN as [facility] Care Management  Extended Emergency Contact Information Primary Emergency Contact: [facility] Address: [address]          [place], [place] [address] [place] Home Phone: [phone] Relation: Spouse  Code Status:  Full Code  Goals of care: Advanced Directive information    07/04/2023    8:49 AM  Advanced Directives  Does Patient Have a Medical Advance Directive? Yes  Type of Advance Directive Living will  Does patient want to make changes to medical advance directive? No - Patient declined     Chief Complaint  Patient presents with   Medical Management of Chronic Issues    6 month follow up and discuss shingles vaccine.   Discussed the use of AI scribe software for clinical note transcription with the patient, who gave verbal consent to proceed.  HPI:  Pt is a 83 y.o. male seen today for a six-month follow-up visit.  He had a dermatological procedure where a lesion was removed from his left side , which returned negative for malignancy. The site has healed completely. He spends a lot of time outdoors and does not consistently use sun protection.  He has a history of COPD and recently experienced a bad cold, which has mostly resolved. No increased wheezing and infrequent use of his albuterol inhaler.  He experiences acid reflux and has been taking omeprazole for over a year. Initially, he was taking two pills a day but has reduced to one pill daily. No current heartburn symptoms.  He is on valsartan 80 mg for blood pressure, which is well-controlled. No home blood pressure readings for review.  He takes Repatha injections and Zetia 10 mg for cholesterol management, which have been effective in  maintaining normal cholesterol levels.  He takes tamsulosin for prostate issues and reports waking up once or twice at night to urinate. No issues with constipation or diarrhea.  He has a history of prediabetes with a previous A1c of 6.1, which has been gradually increasing. He consumes a high intake of sweets and has a family history of diabetes, as his mother was diabetic.  No recent falls and does not use a cane. He has been to the dentist and is in the process of getting a new dental plate. He visits the eye doctor every two years and has no current vision changes.       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   Chest pain, unspecified 08/20/2012   Chest pain, unspecified 12/26/2011   Concussion, unspecified 10/30/1994   COPD (chronic obstructive pulmonary disease) (HCC) 02/24/2006   Corns and callosities 04/11/2007   Dermatophytosis of the body    Disturbance of skin sensation    Diverticulosis of colon (without mention of hemorrhage) 06/14/2010   Edema of male genital organs 08/20/2012   Elevated blood pressure reading without diagnosis of hypertension 06/14/2010   Encounter for long-term (current) use of other medications    Hypercholesterolemia    Hypertrophy of prostate without urinary obstruction and other lower urinary tract symptoms (LUTS)    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   Lumbago 05/11/2009   Nocturia 08/20/2012   Other and unspecified hyperlipidemia 02/24/2006   Pain  in joint, ankle and foot 04/11/2007   Pterygium, unspecified 03/01/2006   Rash and other nonspecific skin eruption 12/28/1999   Seborrheic dermatitis, unspecified 03/21/2001   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   Tobacco use disorder    Ulcer of other part of foot 03/16/2007   Unspecified constipation 05/11/2009   Urinary tract infection, site not specified  05/11/2009   Ventral hernia, unspecified, without mention of obstruction or gangrene    Past Surgical History:  Procedure Laterality Date   ELBOW SURGERY Right    EYE SURGERY     Cataract Removal   FRACTURE SURGERY  1986   right hip, right elbow   KNEE SURGERY Right 01/2007   knee cap injury   MOLE REMOVAL Left 05/2023   SKIN CANCER EXCISION  07/2014   Back and head    TONSILLECTOMY  1948    Allergies  Allergen Reactions   Zocor [Simvastatin] Shortness Of Breath    Sob and itching   Ace Inhibitors Cough    Tolerating ARB instead   Lipitor [Atorvastatin]     Muscle and bone pain   Pravachol [Pravastatin Sodium] Other (See Comments)    myalgias    Allergies as of 07/04/2023       Reactions   Zocor [simvastatin] Shortness Of Breath   Sob and itching   Ace Inhibitors Cough   Tolerating ARB instead   Lipitor [atorvastatin]    Muscle and bone pain   Pravachol [pravastatin Sodium] Other (See Comments)   myalgias        Medication List        Accurate as of July 04, 2023  9:00 AM. If you have any questions, ask your nurse or doctor.          albuterol 108 (90 Base) MCG/ACT inhaler Commonly known as: VENTOLIN HFA INHALE 2 PUFFS INTO THE LUNGS EVERY 6 (SIX) HOURS AS NEEDED FOR WHEEZING OR SHORTNESS OF BREATH.   ezetimibe 10 MG tablet Commonly known as: ZETIA TAKE 1 TABLET EVERY DAY   omeprazole 20 MG capsule Commonly known as: PRILOSEC TAKE 1 CAPSULE EVERY DAY   Repatha SureClick 140 MG/ML Soaj Generic drug: Evolocumab INJECT 1 DOSE SUBCUTANEOUSLY EVERY 14 DAYS   tamsulosin 0.4 MG Caps capsule Commonly known as: FLOMAX Take 1 capsule (0.4 mg total) by mouth daily.   valsartan 80 MG tablet Commonly known as: DIOVAN TAKE 1 TABLET EVERY DAY        Review of Systems  Constitutional:  Negative for appetite change, chills, fatigue, fever and unexpected weight change.  HENT:  Negative for congestion, dental problem, ear discharge, ear pain,  hearing loss, nosebleeds, postnasal drip, rhinorrhea, sinus pressure, sinus pain, sneezing, sore throat, tinnitus and trouble swallowing.   Eyes:  Negative for pain, discharge, redness, itching and visual disturbance.  Respiratory:  Positive for cough. Negative for chest tightness, shortness of breath and wheezing.   Cardiovascular:  Negative for chest pain, palpitations and leg swelling.  Gastrointestinal:  Negative for abdominal distention, abdominal pain, blood in stool, constipation, diarrhea, nausea and vomiting.  Endocrine: Negative for cold intolerance, heat intolerance, polydipsia, polyphagia and polyuria.  Genitourinary:  Negative for difficulty urinating, dysuria, flank pain, frequency and urgency.       Voids 1-2 times at night   Musculoskeletal:  Negative for arthralgias, back pain, gait problem, joint swelling, myalgias, neck pain and neck stiffness.  Skin:  Negative for color change, pallor, rash and wound.  Neurological:  Negative for dizziness, syncope, speech difficulty, weakness, light-headedness, numbness and headaches.  Hematological:  Does not bruise/bleed easily.  Psychiatric/Behavioral:  Negative for agitation, behavioral problems, confusion, hallucinations, self-injury, sleep disturbance and suicidal ideas. The patient is not nervous/anxious.     Immunization History  Administered Date(s) Administered   Fluad Quad(high Dose 65+) 05/27/2019, 03/05/2020, 07/02/2021, 07/04/2022   Influenza, High Dose Seasonal PF 03/09/2017, 05/21/2018   Influenza,inj,Quad PF,6+ Mos 03/31/2014, 04/03/2015, 02/25/2016   PFIZER(Purple Top)SARS-COV-2 Vaccination 07/21/2019, 11/06/2019   Pneumococcal Conjugate-13 07/18/2013   Pneumococcal Polysaccharide-23 10/02/2014, 12/31/2021   Tdap 06/10/2005, 12/03/2013, 01/22/2022   Pertinent  Health Maintenance Due  Topic Date Due   INFLUENZA VACCINE  09/04/2023 (Originally 01/05/2023)      07/29/2022   10:02 AM 10/10/2022    8:57 AM 01/02/2023     8:43 AM 03/15/2023    2:49 PM 07/04/2023    8:48 AM  Fall Risk  Falls in the past year? 0 0 0 0 0  Was there an injury with Fall? 0 0 0 0 0  Fall Risk Category Calculator 0 0 0 0 0  Patient at Risk for Falls Due to No Fall Risks No Fall Risks No Fall Risks No Fall Risks   Fall risk Follow up Falls evaluation completed Falls evaluation completed Falls evaluation completed Falls evaluation completed    Functional Status Survey:    Vitals:   07/04/23 0853  BP: 126/72  Pulse: 60  Resp: 19  Temp: 97.8 F (36.6 C)  SpO2: 98%  Weight: 134 lb 6.4 oz (61 kg)  Height: 5\' 7"  (1.702 m)   Body mass index is 21.05 kg/m. Physical Exam Physical Exam   VITALS: T- 97.8, P- 60, BP- 126/72, SaO2- 98 MEASUREMENTS: WT- 134.4 HEENT: Right ear eardrum normal, no ear wax or signs of infection. Left ear ear wax buildup, eardrum normal. No nasal drainage, no signs of nasal infection, no bleeding. Throat normal, no teeth present. Pupillary response to light normal, no tenderness over sinuses, no lymphadenopathy. Cardiovasc: heart rate regular  CHEST: Lungs auscultated, no signs of pneumonia, presence of mucus production noted. ABDOMEN: Abdomen non-tender upon palpation, presence of hernias. EXTREMITIES: No swelling, evidence of scratching and bleeding.    Psy/behavioral : Mood stable   Labs reviewed: Recent Labs    07/18/22 0842 07/25/22 1438 01/02/23 1008  NA 136 137 139  K 4.5 3.3* 4.6  CL 101 100 105  CO2 28 27 28   GLUCOSE 86 91 91  BUN 14 19 13   CREATININE 1.44* 1.23 1.24*  CALCIUM 8.8 8.1* 9.2   Recent Labs    07/18/22 0842 01/02/23 1008  AST 18 18  ALT 6* 9  BILITOT 0.7 0.6  PROT 6.5 6.6   Recent Labs    07/18/22 0842 07/25/22 1438 01/02/23 1008  WBC 8.5 9.9 5.6  NEUTROABS 7,081  --  3,702  HGB 14.3 13.2 13.8  HCT 41.9 39.7 42.2  MCV 89.7 90.4 90.2  PLT 203 239 216   Lab Results  Component Value Date   TSH 1.69 01/02/2023   Lab Results  Component Value Date    HGBA1C 6.1 (H) 01/02/2023   Lab Results  Component Value Date   CHOL 145 01/02/2023   HDL 68 01/02/2023   LDLCALC 61 01/02/2023   TRIG 82 01/02/2023   CHOLHDL 2.1 01/02/2023    Significant Diagnostic Results in last 30 days:  No results found.  Assessment/Plan  Chronic Obstructive Pulmonary Disease (COPD) Reports feeling well  with no increased dyspnea or wheezing. Recent cold mostly resolved. Uses albuterol inhaler infrequently. - Continue albuterol inhaler as needed - Encourage use of Mucinex with increased hydration for mucus management - COMPLETE METABOLIC PANEL WITH GFR - CBC with Differential/Platelet  Gastroesophageal Reflux Disease (GERD) Symptoms well-managed on omeprazole 20 mg daily. No recent heartburn. Discussed potential long-term effects on vitamin and mineral absorption. Plan to wean off omeprazole. - Wean omeprazole to every other day for one month, then use as needed - Avoid trigger foods (spicy foods, late meals) - Remain upright for at least one hour postprandial - CBC with Differential/Platelet  Hypertension Blood pressure well-controlled at 122/72 mmHg on valsartan 80 mg daily. - Continue valsartan 80 mg daily - Lipid panel - TSH - COMPLETE METABOLIC PANEL WITH GFR - CBC with Differential/Platelet  Hyperlipidemia Cholesterol levels well-controlled with Repatha and Zetia. Recent lipid panel: total cholesterol 145 mg/dL, triglycerides 82 mg/dL, LDL 61 mg/dL. - Continue Repatha injections - Continue Zetia 10 mg daily - Order lipid panel with next lab work - Lipid panel  Benign Prostatic Hyperplasia (BPH) Reports nocturia 1-2 times per night. Currently taking tamsulosin. - Continue tamsulosin as prescribed  Prediabetes Previous A1c 6.1%. Family history of diabetes. Reports high intake of sweets. Discussed reducing sweets and carbohydrates to prevent diabetes progression. - Order A1c test today - Encourage reduction of sweets and carbohydrates -  Recommend regular exercise, such as walking - Hemoglobin A1c - Flu Vaccine Trivalent High Dose (Fluad)  Stage 3a chronic kidney disease (HCC) Previous CR 1.24  - Will continue to avoid Nephrotoxins and dose all other medication for renal clearance  - COMPLETE METABOLIC PANEL WITH GFR  General Health Maintenance Due for flu shot. Completed shingles vaccine series. No recent falls or dental issues. Vision check due next year. - Administer flu shot today - Obtain shingles vaccine records from pharmacy - Schedule Medicare annual wellness visit in July,2025 - Encourage regular dental and vision check-ups - Flu Vaccine Trivalent High Dose (Fluad)  Follow-up - Schedule follow-up appointment in six months - Call with lab results.   Family/ staff Communication: Reviewed plan of care with patient verbalized understanding   Labs/tests ordered:  - Lipid panel - TSH - COMPLETE METABOLIC PANEL WITH GFR - CBC with Differential/Platelet - Hemoglobin A1c  Next Appointment : Return in about 6 months (around 01/01/2024) for medical mangement of chronic issues., Annual wellness visit in July,2025 .   Caesar Bookman, NP

## 2023-07-05 LAB — HEMOGLOBIN A1C
Hgb A1c MFr Bld: 6.2 %{Hb} — ABNORMAL HIGH (ref <5.7)
Mean Plasma Glucose: 131 mg/dL
eAG (mmol/L): 7.3 mmol/L

## 2023-07-05 LAB — CBC WITH DIFFERENTIAL/PLATELET
Absolute Lymphocytes: 1255 {cells}/uL (ref 850–3900)
Absolute Monocytes: 347 {cells}/uL (ref 200–950)
Basophils Absolute: 31 {cells}/uL (ref 0–200)
Basophils Relative: 0.6 %
Eosinophils Absolute: 281 {cells}/uL (ref 15–500)
Eosinophils Relative: 5.5 %
HCT: 43 % (ref 38.5–50.0)
Hemoglobin: 14.3 g/dL (ref 13.2–17.1)
MCH: 29.4 pg (ref 27.0–33.0)
MCHC: 33.3 g/dL (ref 32.0–36.0)
MCV: 88.3 fL (ref 80.0–100.0)
MPV: 10.3 fL (ref 7.5–12.5)
Monocytes Relative: 6.8 %
Neutro Abs: 3188 {cells}/uL (ref 1500–7800)
Neutrophils Relative %: 62.5 %
Platelets: 221 10*3/uL (ref 140–400)
RBC: 4.87 10*6/uL (ref 4.20–5.80)
RDW: 12.4 % (ref 11.0–15.0)
Total Lymphocyte: 24.6 %
WBC: 5.1 10*3/uL (ref 3.8–10.8)

## 2023-07-05 LAB — LIPID PANEL
Cholesterol: 150 mg/dL (ref <200)
HDL: 59 mg/dL (ref >=40)
LDL Cholesterol (Calc): 74 mg/dL
Non-HDL Cholesterol (Calc): 91 mg/dL (ref <130)
Total CHOL/HDL Ratio: 2.5 (calc) (ref <5.0)
Triglycerides: 90 mg/dL (ref <150)

## 2023-07-05 LAB — COMPLETE METABOLIC PANEL WITH GFR
AG Ratio: 1.6 (calc) (ref 1.0–2.5)
ALT: 13 U/L (ref 9–46)
AST: 27 U/L (ref 10–35)
Albumin: 4.2 g/dL (ref 3.6–5.1)
Alkaline phosphatase (APISO): 75 U/L (ref 35–144)
BUN/Creatinine Ratio: 12 (calc) (ref 6–22)
BUN: 19 mg/dL (ref 7–25)
CO2: 25 mmol/L (ref 20–32)
Calcium: 9.4 mg/dL (ref 8.6–10.3)
Chloride: 101 mmol/L (ref 98–110)
Creat: 1.57 mg/dL — ABNORMAL HIGH (ref 0.70–1.22)
Globulin: 2.7 g/dL (ref 1.9–3.7)
Glucose, Bld: 95 mg/dL (ref 65–99)
Potassium: 4.7 mmol/L (ref 3.5–5.3)
Sodium: 138 mmol/L (ref 135–146)
Total Bilirubin: 0.4 mg/dL (ref 0.2–1.2)
Total Protein: 6.9 g/dL (ref 6.1–8.1)
eGFR: 43 mL/min/{1.73_m2} — ABNORMAL LOW (ref >=60)

## 2023-07-05 LAB — TSH: TSH: 2.78 m[IU]/L (ref 0.40–4.50)

## 2023-07-06 ENCOUNTER — Ambulatory Visit: Payer: Self-pay

## 2023-07-06 NOTE — Patient Instructions (Signed)
Visit Information  Thank you for taking time to visit with me today. Please don't hesitate to contact me if I can be of assistance to you.   Following are the goals we discussed today:   Goals Addressed             This Visit's Progress    To improve kidney function   On track    Care Coordination Interventions: Assessed the Designated Party Release wife Darel Hong understanding of chronic kidney disease    Evaluation of current treatment plan related to chronic kidney disease self management and patient's adherence to plan as established by provider      Reviewed prescribed diet increase daily water intake to 48-64 oz daily  Provided education on kidney disease progression    Last practice recorded BP readings:  BP Readings from Last 3 Encounters:  07/04/23 126/72  03/15/23 124/78  01/02/23 130/68   Most recent eGFR/CrCl:  Lab Results  Component Value Date   EGFR 43 (L) 07/04/2023    No components found for: "CRCL"      To work on lowering A1C by adhering to low carb diet       Care Coordination Interventions: Provided education to patient about basic DM disease process Review of patient status, including review of consultants reports, relevant laboratory and other test results, and medications completed Educated wife about risk for disease progression as A1C has been trending upward Counseled on Diabetic diet, my plate method, portion control,150 minutes of moderate intensity exercise/week Mailed printed educational materials related to diabetes management  Assessed patient's understanding of A1c goal: <6.5% Lab Results  Component Value Date   HGBA1C 6.2 (H) 07/04/2023         Our next appointment is by telephone on 08/31/23 at 11:30 AM  Please call the care guide team at [phone] if you need to cancel or reschedule your appointment.   If you are experiencing a Mental Health or Behavioral Health Crisis or need someone to talk to, please call 1-800-273-TALK (toll  free, 24 hour hotline)  Patient verbalizes understanding of instructions and care plan provided today and agrees to view in MyChart. Active MyChart status and patient understanding of how to access instructions and care plan via MyChart confirmed with patient.     Delsa Sale RN BSN CCM [place]  [facility], [facility] Nurse Care Coordinator  Direct Dial: [phone] Website: [email or web address]

## 2023-07-06 NOTE — Patient Outreach (Signed)
  Care Coordination   Follow Up Visit Note   07/06/2023 Name: Thomas Reyes MRN: [id number] DOB: [birth year]  [name] is a 83 y.o. year old male who sees Ngetich, [name] C, NP for primary care. I spoke with  [name] by phone today.  What matters to the patients health and wellness today?  Patient will work on cutting back on sweets and increasing his daily water intake.     Goals Addressed             This Visit's Progress    To improve kidney function   On track    Care Coordination Interventions: Assessed the Designated Party Release wife Darel Hong understanding of chronic kidney disease    Evaluation of current treatment plan related to chronic kidney disease self management and patient's adherence to plan as established by provider      Reviewed prescribed diet increase daily water intake to 48-64 oz daily  Provided education on kidney disease progression    Last practice recorded BP readings:  BP Readings from Last 3 Encounters:  07/04/23 126/72  03/15/23 124/78  01/02/23 130/68   Most recent eGFR/CrCl:  Lab Results  Component Value Date   EGFR 43 (L) 07/04/2023    No components found for: "CRCL"     To work on lowering A1C by adhering to low carb diet       Care Coordination Interventions: Provided education to patient about basic DM disease process Review of patient status, including review of consultants reports, relevant laboratory and other test results, and medications completed Educated wife about risk for disease progression as A1C has been trending upward Counseled on Diabetic diet, my plate method, portion control,150 minutes of moderate intensity exercise/week Mailed printed educational materials related to diabetes management  Assessed patient's understanding of A1c goal: <6.5% Lab Results  Component Value Date   HGBA1C 6.2 (H) 07/04/2023     Interventions Today    Flowsheet Row Most Recent Value  Chronic Disease   Chronic disease  during today's visit Hypertension (HTN), Diabetes, Chronic Obstructive Pulmonary Disease (COPD)  General Interventions   General Interventions Discussed/Reviewed General Interventions Discussed, General Interventions Reviewed, Doctor Visits, Annual Foot Exam, Labs  Doctor Visits Discussed/Reviewed Doctor Visits Discussed, Doctor Visits Reviewed, PCP, Specialist  Education Interventions   Education Provided Provided Education, Provided Printed Education  Provided Verbal Education On Nutrition, Labs, Medication, When to see the doctor, Eye Care, Foot Care  Labs Reviewed Hgb A1c, Kidney Function, Lipid Profile  Nutrition Interventions   Nutrition Discussed/Reviewed Nutrition Discussed, Nutrition Reviewed, Carbohydrate meal planning, Portion sizes, Fluid intake  Pharmacy Interventions   Pharmacy Dicussed/Reviewed Pharmacy Topics Discussed, Pharmacy Topics Reviewed, Medications and their functions          SDOH assessments and interventions completed:  Yes     Care Coordination Interventions:  Yes, provided   Follow up plan: Follow up call scheduled for 08/31/23 @11 :30 AM    Encounter Outcome:  Patient Visit Completed

## 2023-08-07 DIAGNOSIS — D225 Melanocytic nevi of trunk: Secondary | ICD-10-CM | POA: Diagnosis not present

## 2023-08-07 DIAGNOSIS — L821 Other seborrheic keratosis: Secondary | ICD-10-CM | POA: Diagnosis not present

## 2023-08-07 DIAGNOSIS — L814 Other melanin hyperpigmentation: Secondary | ICD-10-CM | POA: Diagnosis not present

## 2023-08-28 ENCOUNTER — Other Ambulatory Visit: Payer: Self-pay | Admitting: Pharmacist

## 2023-08-28 DIAGNOSIS — R931 Abnormal findings on diagnostic imaging of heart and coronary circulation: Secondary | ICD-10-CM

## 2023-08-28 DIAGNOSIS — E7800 Pure hypercholesterolemia, unspecified: Secondary | ICD-10-CM

## 2023-08-28 MED ORDER — REPATHA SURECLICK 140 MG/ML ~~LOC~~ SOAJ: 140.0000 mg | SUBCUTANEOUS | 3 refills | Status: AC

## 2023-08-31 ENCOUNTER — Ambulatory Visit: Payer: Self-pay

## 2023-08-31 DIAGNOSIS — E785 Hyperlipidemia, unspecified: Secondary | ICD-10-CM | POA: Diagnosis not present

## 2023-08-31 LAB — LIPID PANEL
Chol/HDL Ratio: 2.7 ratio (ref 0.0–5.0)
Cholesterol, Total: 149 mg/dL (ref 100–199)
HDL: 55 mg/dL (ref >39)
LDL Chol Calc (NIH): 80 mg/dL (ref 0–99)
Triglycerides: 71 mg/dL (ref 0–149)
VLDL Cholesterol Cal: 14 mg/dL (ref 5–40)

## 2023-08-31 NOTE — Patient Outreach (Signed)
**Note Thomas-Identified via Obfuscation**  Care Coordination   Follow Up Visit Note   08/31/2023 Name: Thomas Reyes MRN: [id number] DOB: 01-Apr-1941  Thomas Reyes is a 83 y.o. year old male who sees Ngetich, [name] C, NP for primary care. I spoke with wife Eudell Mcphee by phone today.  What matters to the patients health and wellness today?  Patient would like to continue to manage his chronic health conditions and stay physically healthy.     Goals Addressed             This Visit's Progress    COMPLETED: To have abnormal mole evaluated and treated       Care Coordination Interventions: Evaluation of current treatment plan related to change in appearance of mole to left side and patient's adherence to plan as established by provider Discussed with wife, patient f/u with Dermatology for the evaluation of an abnormal mole Discussed the mole was removed and found to benign, patient/wife voice having no further c/o related to this condition at this time  Instructed wife to notify patient's PCP of new symptoms or concerns and she verbalizes understanding      COMPLETED: To improve kidney function       Care Coordination Interventions: Assessed the Designated Party Release wife Darel Hong understanding of chronic kidney disease    Evaluation of current treatment plan related to chronic kidney disease self management and patient's adherence to plan as established by provider      Reviewed prescribed diet increase daily water intake to 48-64 oz daily, determined patient has not increased his water intake, he continues to drink a small amount of water but also drinks Gatorade when working outside Provided education on kidney disease progression    Instructed wife to keep patient's PCP informed of new symptoms or concerns  Last practice recorded BP readings:  BP Readings from Last 3 Encounters:  07/04/23 126/72  03/15/23 124/78  01/02/23 130/68   Most recent eGFR/CrCl:  Lab Results  Component Value Date   EGFR 43 (L)  07/04/2023    No components found for: "CRCL"     COMPLETED: To work on lowering A1C by adhering to low carb diet       Care Coordination Interventions: Provided education to patient about basic DM disease process Review of patient status, including review of consultants reports, relevant laboratory and other test results, and medications completed Discussed with patient's wife, patient has decreased his intake of sugary desserts, he will eat crackers occasionally in place of doughnuts Positive reinforcement given for patient making efforts to improve his health  Counseled wife on recommendations for establishing a routine exercise regimen to include 150 minutes weekly of moderate exercise as tolerated  Informed wife of nurse case closure due to patient is staying healthy and managing his chronic health conditions and she verbalizes understanding  Routed note to PCP  Assessed patient's understanding of A1c goal: <6.5% Lab Results  Component Value Date   HGBA1C 6.2 (H) 07/04/2023     Interventions Today    Flowsheet Row Most Recent Value  Chronic Disease   Chronic disease during today's visit Diabetes, Chronic Kidney Disease/End Stage Renal Disease (ESRD), Other, Chronic Obstructive Pulmonary Disease (COPD)  [abnormal mole]  General Interventions   General Interventions Discussed/Reviewed General Interventions Discussed, General Interventions Reviewed, Labs, Doctor Visits  Doctor Visits Discussed/Reviewed Doctor Visits Discussed, Doctor Visits Reviewed, PCP, Specialist  Exercise Interventions   Exercise Discussed/Reviewed Exercise Discussed, Exercise Reviewed, Physical Activity  Physical Activity Discussed/Reviewed Physical Activity Reviewed, Physical  Activity Discussed  Education Interventions   Education Provided Provided Education  Provided Verbal Education On Nutrition, Labs, Medication, Exercise, When to see the doctor  Labs Reviewed Hgb A1c, Kidney Function, Lipid Profile   Nutrition Interventions   Nutrition Discussed/Reviewed Nutrition Discussed, Nutrition Reviewed, Fluid intake, Decreasing sugar intake  Pharmacy Interventions   Pharmacy Dicussed/Reviewed Pharmacy Topics Discussed, Pharmacy Topics Reviewed, Medications and their functions          SDOH assessments and interventions completed:  Yes  SDOH Interventions Today    Flowsheet Row Most Recent Value  SDOH Interventions   Food Insecurity Interventions Intervention Not Indicated  Housing Interventions Intervention Not Indicated  Utilities Interventions Intervention Not Indicated  Physical Activity Interventions Intervention Not Indicated        Care Coordination Interventions:  Yes, provided   Follow up plan: No further intervention required.   Encounter Outcome:  Patient Visit Completed

## 2023-08-31 NOTE — Patient Instructions (Signed)
 Visit Information  Thank you for taking time to visit with me today. Please don't hesitate to contact me if I can be of assistance to you.   Following are the goals we discussed today:   Goals Addressed             This Visit's Progress    COMPLETED: To have abnormal mole evaluated and treated       Care Coordination Interventions: Evaluation of current treatment plan related to change in appearance of mole to left side and patient's adherence to plan as established by provider Discussed with wife, patient f/u with Dermatology for the evaluation of an abnormal mole Discussed the mole was removed and found to benign, patient/wife voice having no further c/o related to this condition at this time  Instructed wife to notify patient's PCP of new symptoms or concerns and she verbalizes understanding      COMPLETED: To improve kidney function       Care Coordination Interventions: Assessed the Designated Party Release wife Darel Hong understanding of chronic kidney disease    Evaluation of current treatment plan related to chronic kidney disease self management and patient's adherence to plan as established by provider      Reviewed prescribed diet increase daily water intake to 48-64 oz daily, determined patient has not increased his water intake, he continues to drink a small amount of water but also drinks Gatorade when working outside Provided education on kidney disease progression    Instructed wife to keep patient's PCP informed of new symptoms or concerns  Last practice recorded BP readings:  BP Readings from Last 3 Encounters:  07/04/23 126/72  03/15/23 124/78  01/02/23 130/68   Most recent eGFR/CrCl:  Lab Results  Component Value Date   EGFR 43 (L) 07/04/2023    No components found for: "CRCL"         COMPLETED: To work on lowering A1C by adhering to low carb diet       Care Coordination Interventions: Provided education to patient about basic DM disease process Review of  patient status, including review of consultants reports, relevant laboratory and other test results, and medications completed Discussed with patient's wife, patient has decreased his intake of sugary desserts, he will eat crackers occasionally in place of doughnuts Positive reinforcement given for patient making efforts to improve his health  Counseled wife on recommendations for establishing a routine exercise regimen to include 150 minutes weekly of moderate exercise as tolerated  Informed wife of nurse case closure due to patient is staying healthy and managing his chronic health conditions and she verbalizes understanding  Routed note to PCP  Assessed patient's understanding of A1c goal: <6.5% Lab Results  Component Value Date   HGBA1C 6.2 (H) 07/04/2023              If you are experiencing a Mental Health or Behavioral Health Crisis or need someone to talk to, please call 1-800-273-TALK (toll free, 24 hour hotline)  The patient verbalized understanding of instructions, educational materials, and care plan provided today and DECLINED offer to receive copy of patient instructions, educational materials, and care plan.   Delsa Sale RN BSN CCM [place]  [facility], [facility] Health Nurse Care Coordinator  Direct Dial: [phone] Website: [email or web address]

## 2023-09-04 ENCOUNTER — Ambulatory Visit: Attending: Internal Medicine | Admitting: Internal Medicine

## 2023-09-04 ENCOUNTER — Encounter: Payer: Self-pay | Admitting: Internal Medicine

## 2023-09-04 VITALS — BP 152/70 | HR 63 | Ht 67.0 in | Wt 135.2 lb

## 2023-09-04 DIAGNOSIS — R0609 Other forms of dyspnea: Secondary | ICD-10-CM | POA: Diagnosis not present

## 2023-09-04 DIAGNOSIS — R011 Cardiac murmur, unspecified: Secondary | ICD-10-CM

## 2023-09-04 DIAGNOSIS — E785 Hyperlipidemia, unspecified: Secondary | ICD-10-CM

## 2023-09-04 DIAGNOSIS — M791 Myalgia, unspecified site: Secondary | ICD-10-CM

## 2023-09-04 DIAGNOSIS — R931 Abnormal findings on diagnostic imaging of heart and coronary circulation: Secondary | ICD-10-CM | POA: Diagnosis not present

## 2023-09-04 DIAGNOSIS — T466X5A Adverse effect of antihyperlipidemic and antiarteriosclerotic drugs, initial encounter: Secondary | ICD-10-CM

## 2023-09-04 DIAGNOSIS — T466X5D Adverse effect of antihyperlipidemic and antiarteriosclerotic drugs, subsequent encounter: Secondary | ICD-10-CM

## 2023-09-04 NOTE — Patient Instructions (Signed)
 Medication Instructions:  NO CHANGES  *If you need a refill on your cardiac medications before your next appointment, please call your pharmacy*  Lab Work: FASTING lab work in 12 months  If you have labs (blood work) drawn today and your tests are completely normal, you will receive your results only by: [employer] (if you have MyChart) OR A paper copy in the mail If you have any lab test that is abnormal or we need to change your treatment, we will call you to review the results.  Testing/Procedures: Your physician has requested that you have an echocardiogram. Echocardiography is a painless test that uses sound waves to create images of your heart. It provides your doctor with information about the size and shape of your heart and how well your heart's chambers and valves are working. This procedure takes approximately one hour. There are no restrictions for this procedure. Please do NOT wear cologne, perfume, aftershave, or lotions (deodorant is allowed). Please arrive 15 minutes prior to your appointment time.  Please note: We ask at that you not bring children with you during ultrasound (echo/ vascular) testing. Due to room size and safety concerns, children are not allowed in the ultrasound rooms during exams. Our front office staff cannot provide observation of children in our lobby area while testing is being conducted. An adult accompanying a patient to their appointment will only be allowed in the ultrasound room at the discretion of the ultrasound technician under special circumstances. We apologize for any inconvenience.   Follow-Up: At [facility], you and your health needs are our priority.  As part of our continuing mission to provide you with exceptional heart care, our providers are all part of one team.  This team includes your primary Cardiologist (physician) and Advanced Practice Providers or APPs (Physician Assistants and Nurse Practitioners) who all work  together to provide you with the care you need, when you need it.  Your next appointment:   12 months with Dr. Rennis Golden  We recommend signing up for the patient portal called "MyChart".  Sign up information is provided on this After Visit Summary.  MyChart is used to connect with patients for Virtual Visits (Telemedicine).  Patients are able to view lab/test results, encounter notes, upcoming appointments, etc.  Non-urgent messages can be sent to your provider as well.   To learn more about what you can do with MyChart, go to [email or web address].        1st Floor: - Lobby - Registration  - Pharmacy  - Lab - Cafe  2nd Floor: - PV Lab - Diagnostic Testing (echo, CT, nuclear med)  3rd Floor: - Vacant  4th Floor: - TCTS (cardiothoracic surgery) - AFib Clinic - Structural Heart Clinic - Vascular Surgery  - Vascular Ultrasound  5th Floor: - HeartCare Cardiology (general and EP) - Clinical Pharmacy for coumadin, hypertension, lipid, weight-loss medications, and med management appointments    Valet parking services will be available as well.

## 2023-09-04 NOTE — Progress Notes (Signed)
 LIPID CLINIC CONSULT NOTE  Chief Complaint:  Follow-up dyslipidemia  Primary Care Physician: Ngetich, Donalee Citrin, NP  Primary Cardiologist:  None  HPI:  Thomas Reyes is a 83 y.o. male who is being seen today for the evaluation of dyslipidemia at the request of Ngetich, Thomas C, NP.  This is a pleasant 83 year old male with a history of tobacco abuse, prior pack per day smoker for 40 to 50 years, with history of COPD not on inhalers, who recently had progressive symptoms of dyspnea on exertion and underwent nuclear stress testing in our office several days ago which was negative for ischemia.  He was referred for lipid management due to persistent dyslipidemia.  He reports has been off of statins for about 3 years due to significant myalgias.  In the past he has tried Lipitor, Zocor and Pravachol, all of which caused side effects.  His total cholesterol was 269, HDL 50, triglycerides 135 and LDL 192.  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  In addition he had a CT scan of the chest last year which showed multivessel coronary calcium and aortic atherosclerosis.  Based on these findings he needs significant, in fact greater than 50% reduction in LDL cholesterol due to recent consensus guidelines in 2018.  Since he is unable to tolerate high potency statins, he would not likely reach these targets without a PCSK9 inhibitor.  03/26/2021  Mr. Thomas Reyes returns today for routine follow-up.  His lipids have gone up slightly.  Total cholesterol now 159, triglycerides 107, HDL 56 and LDL 84.  Previous LDL was 69 about a year ago.  He reports compliance with Repatha which was a cost issue for a while but that has improved and ezetimibe.  I suspect there are dietary issues for this.  He reports being less active.  He denies any chest pain or worsening shortness of breath.  He does get some occasional issues with his COPD primarily in the summer for which she has to use inhalers more  frequently.  09/05/2022  Mr. Thomas Reyes returns today for lipid follow-up.  His cholesterol continues to be very well-controlled on combination of Repatha and ezetimibe.  Total cholesterol now 142, down from 157, triglycerides 79, HDL 61 and LDL 65.  He reports he is getting some shortness of breath.  He said he had COVID in January and did well with an albuterol inhaler.  He does have some underlying COPD but he is not on any maintenance inhaler.  His shortness of breath he says has been fairly stable and mostly when walking up hills.  He denies any chest pressure but as noted previously he does have multivessel coronary calcification.  09/04/2023  Mr. Thomas Reyes is seen today follow-up.  He says that he is feeling better.  His shortness of breath is improved he is able to walk to his mailbox and back now without any issues.  He reports rarely using his inhaler.  Blood pressure initially was elevated 152/70 however repeat came down to 138 systolic.  I did note an aortic murmur today, possibly sclerosis or mild stenosis.  His last echo was in 2014.  He denies any anginal symptoms.  Repeat cholesterol testing looks pretty good but not quite at target.  Total cholesterol 149, HDL 55, triglycerides 71 and LDL 80.  He remains on combination therapy with Repatha and ezetimibe.  PMHx:  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  Chest pain, unspecified 08/20/2012   Chest pain, unspecified 12/26/2011   Concussion, unspecified 10/30/1994   COPD (chronic obstructive pulmonary disease) (HCC) 02/24/2006   Corns and callosities 04/11/2007   Dermatophytosis of the body    Disturbance of skin sensation    Diverticulosis of colon (without mention of hemorrhage) 06/14/2010   Edema of male genital organs 08/20/2012   Elevated blood pressure reading without diagnosis of hypertension 06/14/2010   Encounter for long-term (current) use of  other medications    Hypercholesterolemia    Hypertrophy of prostate without urinary obstruction and other lower urinary tract symptoms (LUTS)    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   Lumbago 05/11/2009   Nocturia 08/20/2012   Other and unspecified hyperlipidemia 02/24/2006   Pain in joint, ankle and foot 04/11/2007   Pterygium, unspecified 03/01/2006   Rash and other nonspecific skin eruption 12/28/1999   Seborrheic dermatitis, unspecified 03/21/2001   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   Tobacco use disorder    Ulcer of other part of foot 03/16/2007   Unspecified constipation 05/11/2009   Urinary tract infection, site not specified 05/11/2009   Ventral hernia, unspecified, without mention of obstruction or gangrene     Past Surgical History:  Procedure Laterality Date   ELBOW SURGERY Right    EYE SURGERY     Cataract Removal   FRACTURE SURGERY  1986   right hip, right elbow   KNEE SURGERY Right 01/2007   knee cap injury   MOLE REMOVAL Left 05/2023   SKIN CANCER EXCISION  07/2014   Back and head    TONSILLECTOMY  1948    FAMHx:  Family History  Problem Relation Age of Onset   Diabetes Mother    Hypertension Mother    Cancer Father     SOCHx:   reports that he quit smoking about 13 years ago. His smoking use included cigarettes. He has never used smokeless tobacco. He reports that he does not currently use alcohol. He reports that he does not use drugs.  ALLERGIES:  Allergies  Allergen Reactions   Zocor [Simvastatin] Shortness Of Breath    Sob and itching   Ace Inhibitors Cough    Tolerating ARB instead   Lipitor [Atorvastatin]     Muscle and bone pain   Pravachol [Pravastatin Sodium] Other (See Comments)    myalgias    ROS: Pertinent items noted in HPI and remainder of comprehensive ROS otherwise negative.  HOME MEDS: Current Outpatient Medications on File Prior to Visit  Medication Sig Dispense Refill    albuterol (VENTOLIN HFA) 108 (90 Base) MCG/ACT inhaler INHALE 2 PUFFS INTO THE LUNGS EVERY 6 (SIX) HOURS AS NEEDED FOR WHEEZING OR SHORTNESS OF BREATH. 1 each 5   Evolocumab (REPATHA SURECLICK) 140 MG/ML SOAJ Inject 140 mg into the skin every 14 (fourteen) days. 6 mL 3   ezetimibe (ZETIA) 10 MG tablet TAKE 1 TABLET EVERY DAY 90 tablet 0   omeprazole (PRILOSEC) 20 MG capsule TAKE 1 CAPSULE EVERY DAY (Patient taking differently: Take 20 mg by mouth daily. Patient has cut back on his frequency to every other day or three times weekly per PCP recommendations.) 90 capsule 3   tamsulosin (FLOMAX) 0.4 MG CAPS capsule Take 1 capsule (0.4 mg total) by mouth daily. 90 capsule 1   valsartan (DIOVAN) 80 MG tablet TAKE 1 TABLET EVERY DAY 90 tablet 1   No current facility-administered medications on file prior to visit.  LABS/IMAGING: No results found for this or any previous visit (from the past 48 hours). No results found.  LIPID PANEL:    Component Value Date/Time   CHOL 149 08/31/2023 0835   TRIG 71 08/31/2023 0835   HDL 55 08/31/2023 0835   CHOLHDL 2.7 08/31/2023 0835   CHOLHDL 2.5 07/04/2023 1002   VLDL 16 11/01/2016 0807   LDLCALC 80 08/31/2023 0835   LDLCALC 74 07/04/2023 1002    WEIGHTS: Wt Readings from Last 3 Encounters:  09/04/23 135 lb 3.2 oz (61.3 kg)  07/04/23 134 lb 6.4 oz (61 kg)  03/15/23 131 lb (59.4 kg)    VITALS: BP (!) 152/70 (BP Location: Left Arm, Patient Position: Sitting, Cuff Size: Normal)   Pulse 63   Ht 5\' 7"  (1.702 m)   Wt 135 lb 3.2 oz (61.3 kg)   SpO2 96%   BMI 21.18 kg/m   EXAM: Deferred  EKG: Deferred  ASSESSMENT: DOE Multivessel coronary artery calcification Longstanding tobacco abuse with probable COPD, on inhalers Recent progressive dyspnea on exertion-negative Myoview stress test (06/2019) Essential hypertension Statin myalgias  PLAN: 1.   Mr. Gabriel reports his dyspnea has improved somewhat.  He denies any anginal symptoms and  therefore I do not think we will need to pursue stress testing at this time.  He does have an aortic murmur, possibly sclerosis or mild stenosis.  His last echo was in 2014 but did not show any aortic valve disease.  I would like to update that today.  Blood pressure was a little elevated but improved after repeat checks.  He has done well on Repatha and ezetimibe with an LDL recently of 80.  This is still a little above target I advised more physical activity and exercise and monitoring his diet.  Plan follow-up with [place] annually or sooner as necessary.  Chrystie Nose, MD, [facility], FACP  [place]  [facility]  Medical Director of the Advanced Lipid Disorders &  Cardiovascular Risk Reduction Clinic Diplomate of the American Board of Clinical Lipidology Attending Cardiologist  Direct Dial: [phone]  Fax: [phone]  Website:  [email or web address]  Lisette Abu Korinne Greenstein 09/04/2023, 9:20 AM

## 2023-09-20 ENCOUNTER — Other Ambulatory Visit: Payer: Self-pay | Admitting: Family

## 2023-09-20 ENCOUNTER — Other Ambulatory Visit: Payer: Self-pay | Admitting: Internal Medicine

## 2023-09-20 DIAGNOSIS — I10 Essential (primary) hypertension: Secondary | ICD-10-CM

## 2023-10-06 ENCOUNTER — Ambulatory Visit (HOSPITAL_COMMUNITY): Attending: Cardiology

## 2023-10-06 DIAGNOSIS — R011 Cardiac murmur, unspecified: Secondary | ICD-10-CM | POA: Insufficient documentation

## 2023-10-06 LAB — ECHOCARDIOGRAM COMPLETE
AR max vel: 1.33 cm2
AV Area VTI: 1.24 cm2
AV Area mean vel: 1.14 cm2
AV Mean grad: 12.3 mmHg
AV Peak grad: 21.3 mmHg
Ao pk vel: 2.31 m/s
Area-P 1/2: 1.94 cm2
P 1/2 time: 616 ms
S' Lateral: 2.5 cm

## 2023-10-06 NOTE — Addendum Note (Signed)
 Addended by: Filbert Huff on: 10/06/2023 06:07 PM   Modules accepted: Orders

## 2024-01-02 ENCOUNTER — Encounter: Payer: Self-pay | Admitting: Family

## 2024-01-02 ENCOUNTER — Ambulatory Visit (INDEPENDENT_AMBULATORY_CARE_PROVIDER_SITE_OTHER): Payer: Medicare HMO | Admitting: Family

## 2024-01-02 VITALS — BP 138/78 | HR 60 | Temp 97.6°F | Resp 18 | Ht 67.0 in | Wt 128.4 lb

## 2024-01-02 DIAGNOSIS — R7303 Prediabetes: Secondary | ICD-10-CM

## 2024-01-02 DIAGNOSIS — N401 Enlarged prostate with lower urinary tract symptoms: Secondary | ICD-10-CM | POA: Diagnosis not present

## 2024-01-02 DIAGNOSIS — N1831 Chronic kidney disease, stage 3a: Secondary | ICD-10-CM | POA: Diagnosis not present

## 2024-01-02 DIAGNOSIS — I10 Essential (primary) hypertension: Secondary | ICD-10-CM

## 2024-01-02 DIAGNOSIS — E7800 Pure hypercholesterolemia, unspecified: Secondary | ICD-10-CM

## 2024-01-02 DIAGNOSIS — N138 Other obstructive and reflux uropathy: Secondary | ICD-10-CM

## 2024-01-02 DIAGNOSIS — K219 Gastro-esophageal reflux disease without esophagitis: Secondary | ICD-10-CM

## 2024-01-02 NOTE — Progress Notes (Signed)
 Provider: Roxan Plough FNP-C   Thomas Reyes, Roxan BROCKS, NP  Patient Care Team: Francesa Eugenio, Roxan BROCKS, NP as PCP - General (Family Medicine) Octavia Bruckner, MD as Consulting Physician (Ophthalmology)  Extended Emergency Contact Information Primary Emergency Contact: [facility] Address: [address]          [place], [place] [address] [place] Home Phone: [phone] Relation: Spouse Secondary Emergency Contact: [facility] Phone: [phone] Relation: Son Preferred language: English Interpreter needed? No  Code Status:  Full Code  Goals of care: Advanced Directive information    01/02/2024    8:55 AM  Advanced Directives  Does Patient Have a Medical Advance Directive? Yes  Type of Advance Directive Living will  Does patient want to make changes to medical advance directive? No - Patient declined     Chief Complaint  Patient presents with   Medical Management of Chronic Issues    6 Month follow up   Discussed the use of AI scribe software for clinical note transcription with the patient, who gave verbal consent to proceed.  History of Present Illness   Thomas Reyes is an 83 year old male with hypertension who presents for a six-month follow-up visit to discuss blood pressure management.  He monitors his blood pressure at home and adjusts his medication accordingly. If his blood pressure is 129 or below, he does not take his medication, but if it is above 130, he takes it at night. For four or five days, he did not take his medication, and his blood pressure remained between 115 and 129. He recalls an instance when his blood pressure dropped to 102, and he did not feel well, so he refrained from taking the medication for two to three days. He is currently taking valsartan  80 mg as needed based on his blood pressure readings.  He is also taking omeprazole  three times a week on Monday, Wednesday, and Friday for heartburn. He has an  albuterol  inhaler but has not needed to use it recently. He continues to take Repatha  and Zetia  10 mg daily for cholesterol management. Additionally, he takes tamsulosin  for an enlarged prostate and reports no issues with urination.  He has lost weight, dropping from 135 pounds to 128.4 pounds, which he attributes to the hot weather and reduced appetite. He currently eats one meal a day, usually at supper time, and occasionally snacks on cookies. He does not eat breakfast or lunch. He stays active, mentioning activities like cutting wood, and notes that he bruises easily. He is also taking aspirin.  No pain, no issues with swallowing, and no swelling in his legs. He mentions having a hernia that occasionally protrudes but does not cause significant issues. He also notes that he wears dentures only when attending church.    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   Chest pain, unspecified 08/20/2012   Chest pain, unspecified 12/26/2011   Concussion, unspecified 10/30/1994   COPD (chronic obstructive pulmonary disease) (HCC) 02/24/2006   Corns and callosities 04/11/2007   Dermatophytosis of the body    Disturbance of skin sensation    Diverticulosis of colon (without mention of hemorrhage) 06/14/2010   Edema of male genital organs 08/20/2012   Elevated blood pressure reading without diagnosis of hypertension 06/14/2010   Encounter for long-term (current) use of other medications    Hypercholesterolemia    Hypertrophy of prostate without urinary obstruction and other lower  urinary tract symptoms (LUTS)    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   Lumbago 05/11/2009   Nocturia 08/20/2012   Other and unspecified hyperlipidemia 02/24/2006   Pain in joint, ankle and foot 04/11/2007   Pterygium, unspecified 03/01/2006   Rash and other nonspecific skin eruption 12/28/1999   Seborrheic dermatitis, unspecified  03/21/2001   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   Tobacco use disorder    Ulcer of other part of foot 03/16/2007   Unspecified constipation 05/11/2009   Urinary tract infection, site not specified 05/11/2009   Ventral hernia, unspecified, without mention of obstruction or gangrene    Past Surgical History:  Procedure Laterality Date   ELBOW SURGERY Right    EYE SURGERY     Cataract Removal   FRACTURE SURGERY  1986   right hip, right elbow   KNEE SURGERY Right 01/2007   knee cap injury   MOLE REMOVAL Left 05/2023   SKIN CANCER EXCISION  07/2014   Back and head    TONSILLECTOMY  1948    Allergies  Allergen Reactions   Zocor  [Simvastatin ] Shortness Of Breath    Sob and itching   Ace Inhibitors Cough    Tolerating ARB instead   Lipitor [Atorvastatin]     Muscle and bone pain   Pravachol  [Pravastatin  Sodium] Other (See Comments)    myalgias    Allergies as of 01/02/2024       Reactions   Zocor  [simvastatin ] Shortness Of Breath   Sob and itching   Ace Inhibitors Cough   Tolerating ARB instead   Lipitor [atorvastatin]    Muscle and bone pain   Pravachol  [pravastatin  Sodium] Other (See Comments)   myalgias        Medication List        Accurate as of January 02, 2024  9:29 AM. If you have any questions, ask your nurse or doctor.          albuterol  108 (90 Base) MCG/ACT inhaler Commonly known as: VENTOLIN  HFA INHALE 2 PUFFS INTO THE LUNGS EVERY 6 (SIX) HOURS AS NEEDED FOR WHEEZING OR SHORTNESS OF BREATH.   aspirin EC 81 MG tablet Take by mouth 3 (three) times a week. Swallow whole.   ezetimibe  10 MG tablet Commonly known as: ZETIA  TAKE 1 TABLET EVERY DAY   omeprazole  20 MG capsule Commonly known as: PRILOSEC TAKE 1 CAPSULE EVERY DAY What changed: additional instructions   Repatha  SureClick 140 MG/ML Soaj Generic drug: Evolocumab  Inject 140 mg into the skin every 14 (fourteen) days.   tamsulosin  0.4 MG  Caps capsule Commonly known as: FLOMAX  TAKE 1 CAPSULE EVERY DAY   valsartan  80 MG tablet Commonly known as: DIOVAN  TAKE 1 TABLET EVERY DAY What changed:  when to take this reasons to take this        Review of Systems  Constitutional:  Negative for appetite change, chills, fatigue, fever and unexpected weight change.  HENT:  Negative for congestion, dental problem, ear discharge, ear pain, facial swelling, hearing loss, nosebleeds, postnasal drip, rhinorrhea, sinus pressure, sinus pain, sneezing, sore throat, tinnitus and trouble swallowing.   Eyes:  Negative for pain, discharge, redness, itching and visual disturbance.  Respiratory:  Negative for cough, chest tightness, shortness of breath and wheezing.   Cardiovascular:  Negative for chest pain, palpitations and leg swelling.  Gastrointestinal:  Negative for abdominal distention, abdominal pain, blood in stool, constipation, diarrhea, nausea and vomiting.  Endocrine: Negative for cold  intolerance, heat intolerance, polydipsia, polyphagia and polyuria.  Genitourinary:  Negative for difficulty urinating, dysuria, flank pain, frequency and urgency.  Musculoskeletal:  Negative for arthralgias, back pain, gait problem, joint swelling, myalgias, neck pain and neck stiffness.  Skin:  Negative for color change, pallor, rash and wound.  Neurological:  Negative for dizziness, syncope, speech difficulty, weakness, light-headedness, numbness and headaches.  Hematological:  Does not bruise/bleed easily.  Psychiatric/Behavioral:  Negative for agitation, behavioral problems, confusion, hallucinations, self-injury, sleep disturbance and suicidal ideas. The patient is not nervous/anxious.     Immunization History  Administered Date(s) Administered   Fluad Quad(high Dose 65+) 05/27/2019, 03/05/2020, 07/02/2021, 07/04/2022   Fluad Trivalent(High Dose 65+) 07/04/2023   Influenza, High Dose Seasonal PF 03/09/2017, 05/21/2018   Influenza,inj,Quad  PF,6+ Mos 03/31/2014, 04/03/2015, 02/25/2016   PFIZER(Purple Top)SARS-COV-2 Vaccination 07/21/2019, 11/06/2019   Pneumococcal Conjugate-13 07/18/2013   Pneumococcal Polysaccharide-23 10/02/2014, 12/31/2021   Tdap 06/10/2005, 12/03/2013, 01/22/2022   Zoster Recombinant(Shingrix) 01/03/2023, 03/15/2023   Pertinent  Health Maintenance Due  Topic Date Due   INFLUENZA VACCINE  01/05/2024      10/10/2022    8:57 AM 01/02/2023    8:43 AM 03/15/2023    2:49 PM 07/04/2023    8:48 AM 01/02/2024    8:54 AM  Fall Risk  Falls in the past year? 0 0 0 0 0  Was there an injury with Fall? 0 0 0 0 0  Fall Risk Category Calculator 0 0 0 0 0  Patient at Risk for Falls Due to No Fall Risks No Fall Risks No Fall Risks  No Fall Risks  Fall risk Follow up Falls evaluation completed Falls evaluation completed Falls evaluation completed  Falls evaluation completed   Functional Status Survey:    Vitals:   01/02/24 0859  BP: 138/78  Pulse: 60  Resp: 18  Temp: 97.6 F (36.4 C)  SpO2: 96%  Weight: 128 lb 6.4 oz (58.2 kg)  Height: 5' 7 (1.702 m)   Body mass index is 20.11 kg/m. Physical Exam  VITALS: T- 97.6, P- 16, BP- 138/78, SaO2- 96% MEASUREMENTS: Weight- 128.4. GENERAL: Alert, cooperative, well developed, no acute distress. HEENT: Normocephalic, normal oropharynx, moist mucous membranes, ears normal, nose normal, no sinus tenderness. CHEST: Clear to auscultation bilaterally, no wheezes, rhonchi, or crackles, no back tenderness. CARDIOVASCULAR: Normal heart rate and rhythm, S1 and S2 normal without murmurs. ABDOMEN: Soft, non-tender, non-distended, without organomegaly, normal bowel sounds. EXTREMITIES: No cyanosis or edema, no leg swelling. MUSCULOSKELETAL: Normal range of motion in left hip. NEUROLOGICAL: Cranial nerves grossly intact, moves all extremities without gross motor or sensory deficit.      Labs reviewed: Recent Labs    01/02/23 1008 07/04/23 1002  NA 139 138  K 4.6 4.7  CL  105 101  CO2 28 25  GLUCOSE 91 95  BUN 13 19  CREATININE 1.24* 1.57*  CALCIUM 9.2 9.4   Recent Labs    01/02/23 1008 07/04/23 1002  AST 18 27  ALT 9 13  BILITOT 0.6 0.4  PROT 6.6 6.9   Recent Labs    01/02/23 1008 07/04/23 1002  WBC 5.6 5.1  NEUTROABS 3,702 3,188  HGB 13.8 14.3  HCT 42.2 43.0  MCV 90.2 88.3  PLT 216 221   Lab Results  Component Value Date   TSH 2.78 07/04/2023   Lab Results  Component Value Date   HGBA1C 6.2 (H) 07/04/2023   Lab Results  Component Value Date   CHOL 149 08/31/2023  HDL 55 08/31/2023   LDLCALC 80 08/31/2023   TRIG 71 08/31/2023   CHOLHDL 2.7 08/31/2023    Significant Diagnostic Results in last 30 days:  No results found.  Assessment/Plan    Hypertension Blood pressure is being monitored at home with a current reading of 138/78 mmHg. Experienced hypotension with a reading of 102 mmHg, leading to temporary discontinuation of medication. - Continue home blood pressure monitoring - Take valsartan  80 mg daily as needed based on blood pressure readings - Hold medication if blood pressure is below 130/80 mmHg - Reinforce importance of regular blood pressure monitoring  Unintentional weight loss Weight decreased from 135 lbs to 128.4 lbs, likely due to reduced caloric intake and hot weather. Reports eating one meal a day with occasional snacking. - Monitor weight and dietary habits  Hyperlipidemia Managed with Repatha  and Zetia  10 mg daily.  Benign prostatic hyperplasia with lower urinary tract symptoms Managed with tamsulosin . No urinary symptoms reported.  Gastroesophageal reflux disease Managed with omeprazole  taken three times a week.  Asthma Has an albuterol  inhaler but has not needed to use it recently.  Easy bruising secondary to aspirin use Experiences easy bruising, likely due to aspirin use. - Advise to inform lab technician about easy bruising for proper handling during blood draw   Family/ staff  Communication: Reviewed plan of care with patient verbalized understanding   Labs/tests ordered:  - CBC with Differential/Platelet - CMP with eGFR([facility]) - TSH - Hgb A1C - Lipid panel  Next Appointment : Return in about 6 months (around 07/04/2024) for medical mangement of chronic issues. SABRA   Spent 30 minutes of Face to face and non-face to face with patient  >50% time spent counseling; reviewing medical record; tests; labs; documentation and developing future plan of care.   Roxan JAYSON Plough, NP

## 2024-01-03 LAB — CBC WITH DIFFERENTIAL/PLATELET
Absolute Lymphocytes: 1134 {cells}/uL (ref 850–3900)
Absolute Monocytes: 333 {cells}/uL (ref 200–950)
Basophils Absolute: 18 {cells}/uL (ref 0–200)
Basophils Relative: 0.4 %
Eosinophils Absolute: 230 {cells}/uL (ref 15–500)
Eosinophils Relative: 5.1 %
HCT: 41 % (ref 38.5–50.0)
Hemoglobin: 13.6 g/dL (ref 13.2–17.1)
MCH: 30.2 pg (ref 27.0–33.0)
MCHC: 33.2 g/dL (ref 32.0–36.0)
MCV: 91.1 fL (ref 80.0–100.0)
MPV: 10.4 fL (ref 7.5–12.5)
Monocytes Relative: 7.4 %
Neutro Abs: 2786 {cells}/uL (ref 1500–7800)
Neutrophils Relative %: 61.9 %
Platelets: 195 Thousand/uL (ref 140–400)
RBC: 4.5 Million/uL (ref 4.20–5.80)
RDW: 13.1 % (ref 11.0–15.0)
Total Lymphocyte: 25.2 %
WBC: 4.5 Thousand/uL (ref 3.8–10.8)

## 2024-01-03 LAB — LIPID PANEL
Cholesterol: 130 mg/dL (ref <200)
HDL: 54 mg/dL (ref >=40)
LDL Cholesterol (Calc): 57 mg/dL
Non-HDL Cholesterol (Calc): 76 mg/dL (ref <130)
Total CHOL/HDL Ratio: 2.4 (calc) (ref <5.0)
Triglycerides: 106 mg/dL (ref <150)

## 2024-01-03 LAB — COMPLETE METABOLIC PANEL WITHOUT GFR
AG Ratio: 1.7 (calc) (ref 1.0–2.5)
ALT: 8 U/L — ABNORMAL LOW (ref 9–46)
AST: 19 U/L (ref 10–35)
Albumin: 4.1 g/dL (ref 3.6–5.1)
Alkaline phosphatase (APISO): 58 U/L (ref 35–144)
BUN/Creatinine Ratio: 13 (calc) (ref 6–22)
BUN: 17 mg/dL (ref 7–25)
CO2: 27 mmol/L (ref 20–32)
Calcium: 9.4 mg/dL (ref 8.6–10.3)
Chloride: 104 mmol/L (ref 98–110)
Creat: 1.3 mg/dL — ABNORMAL HIGH (ref 0.70–1.22)
Globulin: 2.4 g/dL (ref 1.9–3.7)
Glucose, Bld: 94 mg/dL (ref 65–99)
Potassium: 4.7 mmol/L (ref 3.5–5.3)
Sodium: 139 mmol/L (ref 135–146)
Total Bilirubin: 0.6 mg/dL (ref 0.2–1.2)
Total Protein: 6.5 g/dL (ref 6.1–8.1)

## 2024-01-03 LAB — HEMOGLOBIN A1C
Hgb A1c MFr Bld: 6.3 % — ABNORMAL HIGH (ref <5.7)
Mean Plasma Glucose: 134 mg/dL
eAG (mmol/L): 7.4 mmol/L

## 2024-01-03 LAB — TSH: TSH: 2.65 m[IU]/L (ref 0.40–4.50)

## 2024-01-04 ENCOUNTER — Ambulatory Visit: Payer: Self-pay | Admitting: Family

## 2024-01-10 ENCOUNTER — Encounter: Payer: Medicare HMO | Admitting: Family

## 2024-01-17 ENCOUNTER — Encounter: Payer: Self-pay | Admitting: Family

## 2024-01-17 ENCOUNTER — Ambulatory Visit (INDEPENDENT_AMBULATORY_CARE_PROVIDER_SITE_OTHER): Admitting: Family

## 2024-01-17 VITALS — BP 130/72 | HR 60 | Temp 97.8°F | Resp 19 | Ht 67.0 in | Wt 131.0 lb

## 2024-01-17 DIAGNOSIS — Z0000 Encounter for general adult medical examination without abnormal findings: Secondary | ICD-10-CM | POA: Diagnosis not present

## 2024-01-17 NOTE — Progress Notes (Signed)
 Subjective:   Thomas Reyes is a 83 y.o. male who presents for Medicare Annual/Subsequent preventive examination.  Visit Complete: In person  Patient Medicare AWV questionnaire was completed by the patient on 01/17/2024; I have confirmed that all information answered by patient is correct and no changes since this date.  Cardiac Risk Factors include: advanced age (>58men, >30 women);male gender;smoking/ tobacco exposure;hypertension     Objective:    Today's Vitals   01/17/24 1051  BP: 130/72  Pulse: 60  Resp: 19  Temp: 97.8 F (36.6 C)  SpO2: 98%  Weight: 131 lb (59.4 kg)  Height: 5' 7 (1.702 m)   Body mass index is 20.52 kg/m.     01/17/2024   10:41 AM 01/02/2024    8:55 AM 07/04/2023    8:49 AM 07/29/2022   10:03 AM 07/25/2022   12:51 PM 07/04/2022    8:57 AM 12/31/2021    9:03 AM  Advanced Directives  Does Patient Have a Medical Advance Directive? Yes Yes Yes Yes Yes Yes Yes  Type of Advance Directive Living will Living will Living will Living will;Healthcare Power of Attorney Living will Living will Living will  Does patient want to make changes to medical advance directive? No - Patient declined No - Patient declined No - Patient declined   No - Patient declined No - Patient declined  Copy of Healthcare Power of Attorney in Chart?    No - copy requested       Current Medications (verified) Outpatient Encounter Medications as of 01/17/2024  Medication Sig   albuterol  (VENTOLIN  HFA) 108 (90 Base) MCG/ACT inhaler INHALE 2 PUFFS INTO THE LUNGS EVERY 6 (SIX) HOURS AS NEEDED FOR WHEEZING OR SHORTNESS OF BREATH.   aspirin EC 81 MG tablet Take by mouth 3 (three) times a week. Swallow whole.   Evolocumab  (REPATHA  SURECLICK) 140 MG/ML SOAJ Inject 140 mg into the skin every 14 (fourteen) days.   ezetimibe  (ZETIA ) 10 MG tablet TAKE 1 TABLET EVERY DAY   omeprazole  (PRILOSEC) 20 MG capsule TAKE 1 CAPSULE EVERY DAY (Patient taking differently: Take 20 mg by mouth daily.  Patient has cut back on his frequency to every other day or three times weekly per PCP recommendations.)   tamsulosin  (FLOMAX ) 0.4 MG CAPS capsule TAKE 1 CAPSULE EVERY DAY   valsartan  (DIOVAN ) 80 MG tablet TAKE 1 TABLET EVERY DAY   No facility-administered encounter medications on file as of 01/17/2024.    Allergies (verified) Zocor  [simvastatin ], Ace inhibitors, Lipitor [atorvastatin], and Pravachol  [pravastatin  sodium]   History: 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   Chest pain, unspecified 08/20/2012   Chest pain, unspecified 12/26/2011   Concussion, unspecified 10/30/1994   COPD (chronic obstructive pulmonary disease) (HCC) 02/24/2006   Corns and callosities 04/11/2007   Dermatophytosis of the body    Disturbance of skin sensation    Diverticulosis of colon (without mention of hemorrhage) 06/14/2010   Edema of male genital organs 08/20/2012   Elevated blood pressure reading without diagnosis of hypertension 06/14/2010   Encounter for long-term (current) use of other medications    Hypercholesterolemia    Hypertrophy of prostate without urinary obstruction and other lower urinary tract symptoms (LUTS)    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   Lumbago 05/11/2009   Nocturia 08/20/2012   Other and unspecified hyperlipidemia 02/24/2006   Pain in joint, ankle and foot 04/11/2007   Pterygium, unspecified 03/01/2006  Rash and other nonspecific skin eruption 12/28/1999   Seborrheic dermatitis, unspecified 03/21/2001   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   Tobacco use disorder    Ulcer of other part of foot 03/16/2007   Unspecified constipation 05/11/2009   Urinary tract infection, site not specified 05/11/2009   Ventral hernia, unspecified, without mention of obstruction or gangrene    Past Surgical History:  Procedure Laterality Date   ELBOW  SURGERY Right    EYE SURGERY     Cataract Removal   FRACTURE SURGERY  1986   right hip, right elbow   KNEE SURGERY Right 01/2007   knee cap injury   MOLE REMOVAL Left 05/2023   SKIN CANCER EXCISION  07/2014   Back and head    TONSILLECTOMY  1948   Family History  Problem Relation Age of Onset   Diabetes Mother    Hypertension Mother    Cancer Father    Social History   Socioeconomic History   Marital status: Married    Spouse name: Not on file   Number of children: Not on file   Years of education: Not on file   Highest education level: Not on file  Occupational History   Not on file  Tobacco Use   Smoking status: Former    Current packs/day: 0.00    Types: Cigarettes    Quit date: 06/09/2010    Years since quitting: 13.6   Smokeless tobacco: Never  Vaping Use   Vaping status: Never Used  Substance and Sexual Activity   Alcohol use: Not Currently   Drug use: No   Sexual activity: Not Currently  Other Topics Concern   Not on file  Social History Narrative   Not on file   Social Drivers of Health   Financial Resource Strain: Low Risk  (08/18/2022)   Overall Financial Resource Strain (CARDIA)    Difficulty of Paying Living Expenses: Not hard at all  Food Insecurity: No Food Insecurity (01/17/2024)   Hunger Vital Sign    Worried About Running Out of Food in the Last Year: Never true    Ran Out of Food in the Last Year: Never true  Transportation Needs: No Transportation Needs (01/17/2024)   PRAPARE - Administrator, Civil Service (Medical): No    Lack of Transportation (Non-Medical): No  Physical Activity: Sufficiently Active (08/31/2023)   Exercise Vital Sign    Days of Exercise per Week: 7 days    Minutes of Exercise per Session: 30 min  Stress: Stress Concern Present (04/10/2023)   [employer] of Occupational Health - Occupational Stress Questionnaire    Feeling of Stress : To some extent  Social Connections: Somewhat Isolated (09/04/2017)    Social Connection and Isolation Panel    Frequency of Communication with Friends and Family: Once a week    Frequency of Social Gatherings with Friends and Family: Once a week    Attends Religious Services: More than 4 times per year    Active Member of [employer] or Organizations: No    Attends Engineer, structural: Never    Marital Status: Married    Tobacco Counseling Counseling given: Not Answered   Clinical Intake:  Pre-visit preparation completed: No  Pain : No/denies pain     BMI - recorded: 20.52 Nutritional Status: BMI of 19-24  Normal Nutritional Risks: None Diabetes: No  How often do you need to have someone help you when you  read instructions, pamphlets, or other written materials from your doctor or pharmacy?: 1 - Never What is the last grade level you completed in school?: 12 grade  Interpreter Needed?: No      Activities of Daily Living    01/17/2024   11:09 AM  In your present state of health, do you have any difficulty performing the following activities:  Hearing? 1  Comment wears hearing  Vision? 0  Difficulty concentrating or making decisions? 0  Walking or climbing stairs? 0  Dressing or bathing? 0  Doing errands, shopping? 0  Preparing Food and eating ? N  Using the Toilet? N  In the past six months, have you accidently leaked urine? N  Do you have problems with loss of bowel control? N  Managing your Medications? N  Managing your Finances? N  Housekeeping or managing your Housekeeping? N    Patient Care Team: Lorance Pickeral C, NP as PCP - General (Family Medicine) Octavia Bruckner, MD as Consulting Physician (Ophthalmology)  Indicate any recent Medical Services you may have received from other than [facility] providers in the past year (date may be approximate).     Assessment:   This is a routine wellness examination for [place].  Hearing/Vision screen Hearing Screening - Comments:: Some hearing issues pt wear hearing  aids Vision Screening - Comments:: Pt unable to complete eye exam vision blurry pt has upcoming appointment.   Goals Addressed             This Visit's Progress    <enter goal here>   On track    Starting 06/27/16, I will maintain my current lifestyle.        Depression Screen    01/17/2024   10:42 AM 01/02/2024    8:54 AM 07/04/2023    8:48 AM 03/15/2023    2:49 PM 10/10/2022    8:57 AM 07/29/2022   10:02 AM 11/10/2021   11:13 AM  PHQ 2/9 Scores  PHQ - 2 Score 0 0 0 0 0 0 0  PHQ- 9 Score    0       Fall Risk    01/17/2024   10:42 AM 01/02/2024    8:54 AM 07/04/2023    8:48 AM 03/15/2023    2:49 PM 01/02/2023    8:43 AM  Fall Risk   Falls in the past year? 0 0 0 0 0  Number falls in past yr: 0 0 0 0 0  Injury with Fall? 0 0 0 0 0  Risk for fall due to : No Fall Risks No Fall Risks  No Fall Risks No Fall Risks  Follow up Falls evaluation completed Falls evaluation completed  Falls evaluation completed Falls evaluation completed    MEDICARE RISK AT HOME: Medicare Risk at Home Any stairs in or around the home?: No If so, are there any without handrails?: No Home free of loose throw rugs in walkways, pet beds, electrical cords, etc?: Yes Adequate lighting in your home to reduce risk of falls?: Yes Life alert?: No Use of a cane, walker or w/c?: No Grab bars in the bathroom?: No Shower chair or bench in shower?: No Elevated toilet seat or a handicapped toilet?: No  TIMED UP AND GO:  Was the test performed?  Yes  Length of time to ambulate 10 feet: 15 sec Gait slow and steady without use of assistive device    Cognitive Function:    09/04/2017    9:46 AM 06/27/2016  3:36 PM 04/03/2015    8:52 AM  MMSE - Mini Mental State Exam  Orientation to time 5 5  4    Orientation to Place 5 5  5    Registration 3 3  3    Attention/ Calculation 4 4  5    Recall 1 1  2    Language- name 2 objects 2 2  2    Language- repeat 1 1 1   Language- follow 3 step command 3 3  3    Language-  read & follow direction 1 1  1    Write a sentence 1 1  1    Copy design 1 0  1   Total score 27 26  28       Data saved with a previous flowsheet row definition        01/17/2024   10:42 AM 11/10/2021   11:15 AM 11/18/2020   11:26 AM 10/08/2019   10:55 AM 10/05/2018    8:46 AM  6CIT Screen  What Year? 0 points 0 points 0 points 0 points 0 points  What month? 0 points 0 points 0 points 0 points 0 points  What time? 0 points 0 points 0 points 0 points 0 points  Count back from 20 0 points 0 points 0 points 0 points 0 points  Months in reverse 2 points 0 points 2 points 0 points 0 points  Repeat phrase 2 points 6 points 0 points 4 points 0 points  Total Score 4 points 6 points 2 points 4 points 0 points    Immunizations Immunization History  Administered Date(s) Administered   Fluad Quad(high Dose 65+) 05/27/2019, 03/05/2020, 07/02/2021, 07/04/2022   Fluad Trivalent(High Dose 65+) 07/04/2023   Influenza, High Dose Seasonal PF 03/09/2017, 05/21/2018   Influenza,inj,Quad PF,6+ Mos 03/31/2014, 04/03/2015, 02/25/2016   PFIZER(Purple Top)SARS-COV-2 Vaccination 07/21/2019, 11/06/2019   Pneumococcal Conjugate-13 07/18/2013   Pneumococcal Polysaccharide-23 10/02/2014, 12/31/2021   Tdap 06/10/2005, 12/03/2013, 01/22/2022   Zoster Recombinant(Shingrix) 01/03/2023, 03/15/2023    TDAP status: Up to date  Flu Vaccine status: Due, Education has been provided regarding the importance of this vaccine. Advised may receive this vaccine at local pharmacy or Health Dept. Aware to provide a copy of the vaccination record if obtained from local pharmacy or Health Dept. Verbalized acceptance and understanding.  Pneumococcal vaccine status: Up to date  Covid-19 vaccine status: Declined, Education has been provided regarding the importance of this vaccine but patient still declined. Advised may receive this vaccine at local pharmacy or Health Dept.or vaccine clinic. Aware to provide a copy of the vaccination  record if obtained from local pharmacy or Health Dept. Verbalized acceptance and understanding.  Qualifies for Shingles Vaccine? Yes   Zostavax completed No   Shingrix Completed?: Yes  Screening Tests Health Maintenance  Topic Date Due   INFLUENZA VACCINE  01/05/2024   Medicare Annual Wellness (AWV)  01/16/2025   DTaP/Tdap/Td (4 - Td or Tdap) 01/23/2032   Pneumococcal Vaccine: 50+ Years  Completed   Zoster Vaccines- Shingrix  Completed   Hepatitis B Vaccines  Aged Out   HPV VACCINES  Aged Out   Meningococcal B Vaccine  Aged Out   COVID-19 Vaccine  Discontinued   Hepatitis C Screening  Discontinued    Health Maintenance  Health Maintenance Due  Topic Date Due   INFLUENZA VACCINE  01/05/2024    Colorectal cancer screening: No longer required.   Lung Cancer Screening: (Low Dose CT Chest recommended if Age 61-80 years, 20 pack-year currently smoking OR have quit w/in  15years.) does not qualify.   Lung Cancer Screening Referral: N/a   Additional Screening:  Hepatitis C Screening: does not qualify; Completed N/A   Vision Screening: Recommended annual ophthalmology exams for early detection of glaucoma and other disorders of the eye. Is the patient up to date with their annual eye exam?  Yes  Who is the provider or what is the name of the office in which the patient attends annual eye exams? Dr.Groat  If pt is not established with a provider, would they like to be referred to a provider to establish care? No .   Dental Screening: Recommended annual dental exams for proper oral hygiene  Diabetic Foot Exam: Diabetic Foot Exam: Completed N/A   Community Resource Referral / Chronic Care Management: CRR required this visit?  No   CCM required this visit?  No     Plan:     I have personally reviewed and noted the following in the patient's chart:   Medical and social history Use of alcohol, tobacco or illicit drugs  Current medications and supplements including opioid  prescriptions. Patient is not currently taking opioid prescriptions. Functional ability and status Nutritional status Physical activity Advanced directives List of other physicians Hospitalizations, surgeries, and ER visits in previous 12 months Vitals Screenings to include cognitive, depression, and falls Referrals and appointments  In addition, I have reviewed and discussed with patient certain preventive protocols, quality metrics, and best practice recommendations. A written personalized care plan for preventive services as well as general preventive health recommendations were provided to patient.     Roxan JAYSON Plough, NP   01/17/2024   After Visit Summary: (In Person-Printed) AVS printed and given to the patient  Nurse Notes: Advised to get Influenza vaccine in September -October 2025

## 2024-01-17 NOTE — Patient Instructions (Signed)
 Thomas Reyes , Thank you for taking time to come for your Medicare Wellness Visit. I appreciate your ongoing commitment to your health goals. Please review the following plan we discussed and let me know if I can assist you in the future.   Screening recommendations/referrals: Colonoscopy : N/A  Recommended yearly ophthalmology/optometry visit for glaucoma screening and checkup Recommended yearly dental visit for hygiene and checkup  Vaccinations: Influenza vaccine due annually in September/October Pneumococcal vaccine : Up to date  Tdap vaccine : Up to date  Shingles vaccine : Up to date     Advanced directives: yes   Conditions/risks identified: Cardiac Risk Factors include: advanced age (>109men, >5 women);male gender;smoking/ tobacco exposure;hypertension  Next appointment: 1 Year   Preventive Care 84 Years and Older, Male Preventive care refers to lifestyle choices and visits with your health care provider that can promote health and wellness. What does preventive care include? A yearly physical exam. This is also called an annual well check. Dental exams once or twice a year. Routine eye exams. Ask your health care provider how often you should have your eyes checked. Personal lifestyle choices, including: Daily care of your teeth and gums. Regular physical activity. Eating a healthy diet. Avoiding tobacco and drug use. Limiting alcohol use. Practicing safe sex. Taking low doses of aspirin every day. Taking vitamin and mineral supplements as recommended by your health care provider. What happens during an annual well check? The services and screenings done by your health care provider during your annual well check will depend on your age, overall health, lifestyle risk factors, and family history of disease. Counseling  Your health care provider may ask you questions about your: Alcohol use. Tobacco use. Drug use. Emotional well-being. Home and relationship  well-being. Sexual activity. Eating habits. History of falls. Memory and ability to understand (cognition). Work and work Astronomer. Screening  You may have the following tests or measurements: Height, weight, and BMI. Blood pressure. Lipid and cholesterol levels. These may be checked every 5 years, or more frequently if you are over 21 years old. Skin check. Lung cancer screening. You may have this screening every year starting at age 36 if you have a 30-pack-year history of smoking and currently smoke or have quit within the past 15 years. Fecal occult blood test (FOBT) of the stool. You may have this test every year starting at age 54. Flexible sigmoidoscopy or colonoscopy. You may have a sigmoidoscopy every 5 years or a colonoscopy every 10 years starting at age 19. Prostate cancer screening. Recommendations will vary depending on your family history and other risks. Hepatitis C blood test. Hepatitis B blood test. Sexually transmitted disease (STD) testing. Diabetes screening. This is done by checking your blood sugar (glucose) after you have not eaten for a while (fasting). You may have this done every 1-3 years. Abdominal aortic aneurysm (AAA) screening. You may need this if you are a current or former smoker. Osteoporosis. You may be screened starting at age 10 if you are at high risk. Talk with your health care provider about your test results, treatment options, and if necessary, the need for more tests. Vaccines  Your health care provider may recommend certain vaccines, such as: Influenza vaccine. This is recommended every year. Tetanus, diphtheria, and acellular pertussis (Tdap, Td) vaccine. You may need a Td booster every 10 years. Zoster vaccine. You may need this after age 4. Pneumococcal 13-valent conjugate (PCV13) vaccine. One dose is recommended after age 64. Pneumococcal polysaccharide (PPSV23) vaccine.  One dose is recommended after age 39. Talk to your health care  provider about which screenings and vaccines you need and how often you need them. This information is not intended to replace advice given to you by your health care provider. Make sure you discuss any questions you have with your health care provider. Document Released: 06/19/2015 Document Revised: 02/10/2016 Document Reviewed: 03/24/2015 Elsevier Interactive Patient Education  2017 [employer].  Fall Prevention in the Home Falls can cause injuries. They can happen to people of all ages. There are many things you can do to make your home safe and to help prevent falls. What can I do on the outside of my home? Regularly fix the edges of walkways and driveways and fix any cracks. Remove anything that might make you trip as you walk through a door, such as a raised step or threshold. Trim any bushes or trees on the path to your home. Use bright outdoor lighting. Clear any walking paths of anything that might make someone trip, such as rocks or tools. Regularly check to see if handrails are loose or broken. Make sure that both sides of any steps have handrails. Any raised decks and porches should have guardrails on the edges. Have any leaves, snow, or ice cleared regularly. Use sand or salt on walking paths during winter. Clean up any spills in your garage right away. This includes oil or grease spills. What can I do in the bathroom? Use night lights. Install grab bars by the toilet and in the tub and shower. Do not use towel bars as grab bars. Use non-skid mats or decals in the tub or shower. If you need to sit down in the shower, use a plastic, non-slip stool. Keep the floor dry. Clean up any water that spills on the floor as soon as it happens. Remove soap buildup in the tub or shower regularly. Attach bath mats securely with double-sided non-slip rug tape. Do not have throw rugs and other things on the floor that can make you trip. What can I do in the bedroom? Use night lights. Make  sure that you have a light by your bed that is easy to reach. Do not use any sheets or blankets that are too big for your bed. They should not hang down onto the floor. Have a firm chair that has side arms. You can use this for support while you get dressed. Do not have throw rugs and other things on the floor that can make you trip. What can I do in the kitchen? Clean up any spills right away. Avoid walking on wet floors. Keep items that you use a lot in easy-to-reach places. If you need to reach something above you, use a strong step stool that has a grab bar. Keep electrical cords out of the way. Do not use floor polish or wax that makes floors slippery. If you must use wax, use non-skid floor wax. Do not have throw rugs and other things on the floor that can make you trip. What can I do with my stairs? Do not leave any items on the stairs. Make sure that there are handrails on both sides of the stairs and use them. Fix handrails that are broken or loose. Make sure that handrails are as long as the stairways. Check any carpeting to make sure that it is firmly attached to the stairs. Fix any carpet that is loose or worn. Avoid having throw rugs at the top or bottom of  the stairs. If you do have throw rugs, attach them to the floor with carpet tape. Make sure that you have a light switch at the top of the stairs and the bottom of the stairs. If you do not have them, ask someone to add them for you. What else can I do to help prevent falls? Wear shoes that: Do not have high heels. Have rubber bottoms. Are comfortable and fit you well. Are closed at the toe. Do not wear sandals. If you use a stepladder: Make sure that it is fully opened. Do not climb a closed stepladder. Make sure that both sides of the stepladder are locked into place. Ask someone to hold it for you, if possible. Clearly mark and make sure that you can see: Any grab bars or handrails. First and last steps. Where the  edge of each step is. Use tools that help you move around (mobility aids) if they are needed. These include: Canes. Walkers. Scooters. Crutches. Turn on the lights when you go into a dark area. Replace any light bulbs as soon as they burn out. Set up your furniture so you have a clear path. Avoid moving your furniture around. If any of your floors are uneven, fix them. If there are any pets around you, be aware of where they are. Review your medicines with your doctor. Some medicines can make you feel dizzy. This can increase your chance of falling. Ask your doctor what other things that you can do to help prevent falls. This information is not intended to replace advice given to you by your health care provider. Make sure you discuss any questions you have with your health care provider. Document Released: 03/19/2009 Document Revised: 10/29/2015 Document Reviewed: 06/27/2014 Elsevier Interactive Patient Education  2017 [employer].

## 2024-02-13 DIAGNOSIS — H43811 Vitreous degeneration, right eye: Secondary | ICD-10-CM | POA: Diagnosis not present

## 2024-02-13 DIAGNOSIS — H353131 Nonexudative age-related macular degeneration, bilateral, early dry stage: Secondary | ICD-10-CM | POA: Diagnosis not present

## 2024-02-13 DIAGNOSIS — H11042 Peripheral pterygium, stationary, left eye: Secondary | ICD-10-CM | POA: Diagnosis not present

## 2024-02-13 DIAGNOSIS — H04123 Dry eye syndrome of bilateral lacrimal glands: Secondary | ICD-10-CM | POA: Diagnosis not present

## 2024-02-13 DIAGNOSIS — H10413 Chronic giant papillary conjunctivitis, bilateral: Secondary | ICD-10-CM | POA: Diagnosis not present

## 2024-02-13 DIAGNOSIS — Z961 Presence of intraocular lens: Secondary | ICD-10-CM | POA: Diagnosis not present

## 2024-03-13 DIAGNOSIS — H353132 Nonexudative age-related macular degeneration, bilateral, intermediate dry stage: Secondary | ICD-10-CM | POA: Diagnosis not present

## 2024-03-13 DIAGNOSIS — H02833 Dermatochalasis of right eye, unspecified eyelid: Secondary | ICD-10-CM | POA: Diagnosis not present

## 2024-03-13 DIAGNOSIS — H02836 Dermatochalasis of left eye, unspecified eyelid: Secondary | ICD-10-CM | POA: Diagnosis not present

## 2024-03-13 DIAGNOSIS — H43813 Vitreous degeneration, bilateral: Secondary | ICD-10-CM | POA: Diagnosis not present

## 2024-03-13 LAB — OPHTHALMOLOGY REPORT-SCANNED

## 2024-03-14 DIAGNOSIS — H524 Presbyopia: Secondary | ICD-10-CM | POA: Diagnosis not present

## 2024-04-23 DIAGNOSIS — Z833 Family history of diabetes mellitus: Secondary | ICD-10-CM | POA: Diagnosis not present

## 2024-04-23 DIAGNOSIS — E785 Hyperlipidemia, unspecified: Secondary | ICD-10-CM | POA: Diagnosis not present

## 2024-04-23 DIAGNOSIS — Z8249 Family history of ischemic heart disease and other diseases of the circulatory system: Secondary | ICD-10-CM | POA: Diagnosis not present

## 2024-04-23 DIAGNOSIS — I10 Essential (primary) hypertension: Secondary | ICD-10-CM | POA: Diagnosis not present

## 2024-04-23 DIAGNOSIS — Z7982 Long term (current) use of aspirin: Secondary | ICD-10-CM | POA: Diagnosis not present

## 2024-04-23 DIAGNOSIS — M1990 Unspecified osteoarthritis, unspecified site: Secondary | ICD-10-CM | POA: Diagnosis not present

## 2024-04-23 DIAGNOSIS — N400 Enlarged prostate without lower urinary tract symptoms: Secondary | ICD-10-CM | POA: Diagnosis not present

## 2024-04-23 DIAGNOSIS — K219 Gastro-esophageal reflux disease without esophagitis: Secondary | ICD-10-CM | POA: Diagnosis not present

## 2024-04-23 DIAGNOSIS — J449 Chronic obstructive pulmonary disease, unspecified: Secondary | ICD-10-CM | POA: Diagnosis not present

## 2024-05-06 ENCOUNTER — Encounter: Payer: Self-pay | Admitting: Pharmacist

## 2024-05-06 NOTE — Progress Notes (Signed)
   05/06/2024  Patient ID: Thomas Reyes, male   DOB: 10-10-1940, 83 y.o.   MRN: [id number]   Pharmacy Quality Measure Review  This patient is appearing on a report for being at risk of failing the adherence measure for hypertension (ACEi/ARB) medications this calendar year.   Medication: Valsartan  80 mg  Last fill date: 04/06/24 for 90 day supply  Insurance report was not up to date. No action needed at this time.   [name], PharmD, BCACP Clinical Pharmacist [phone]

## 2024-07-04 ENCOUNTER — Ambulatory Visit: Payer: Self-pay | Admitting: Family

## 2024-07-08 ENCOUNTER — Ambulatory Visit: Admitting: Family

## 2024-07-10 ENCOUNTER — Telehealth: Payer: Self-pay | Admitting: Internal Medicine

## 2024-07-10 NOTE — Telephone Encounter (Signed)
 Pt c/o medication issue:  1. Name of Medication:   Evolocumab  (REPATHA  SURECLICK) 140 MG/ML SOAJ   2. How are you currently taking this medication (dosage and times per day)?   As prescribed  3. Are you having a reaction (difficulty breathing--STAT)?   4. What is your medication issue?   Wife Mateo) stated patient wants to renew his [employer] grant to get this medication and wants a call back to discuss next steps.

## 2024-07-12 ENCOUNTER — Telehealth: Payer: Self-pay | Admitting: Pharmacy Technician

## 2024-07-12 NOTE — Telephone Encounter (Signed)
 Patient Advocate Encounter   The patient was approved for a [employer] grant that will help cover the cost of [employer]   Total amount awarded, 2500.00.  Effective: 07/18/24 - 07/17/25   APW:[id number] ERW:[id number] Hmnle:[id number] PI:[id number]  [employer] ID: [id number]   Pharmacy provided with approval and processing information. Patient informed via phone

## 2024-07-22 ENCOUNTER — Ambulatory Visit: Admitting: Family

## 2025-01-20 ENCOUNTER — Encounter: Payer: Self-pay | Admitting: Family
